# Patient Record
Sex: Male | Born: 1945 | Race: White | Hispanic: No | Marital: Married | Smoking: Former smoker
Health system: Southern US, Community
[De-identification: ages and names within clinical notes are randomized; demographics above are authoritative.]

## PROBLEM LIST (undated history)

## (undated) DIAGNOSIS — N400 Enlarged prostate without lower urinary tract symptoms: Secondary | ICD-10-CM

## (undated) DIAGNOSIS — E119 Type 2 diabetes mellitus without complications: Secondary | ICD-10-CM

## (undated) DIAGNOSIS — K219 Gastro-esophageal reflux disease without esophagitis: Secondary | ICD-10-CM

## (undated) DIAGNOSIS — D500 Iron deficiency anemia secondary to blood loss (chronic): Secondary | ICD-10-CM

## (undated) DIAGNOSIS — I10 Essential (primary) hypertension: Secondary | ICD-10-CM

## (undated) DIAGNOSIS — K429 Umbilical hernia without obstruction or gangrene: Secondary | ICD-10-CM

## (undated) DIAGNOSIS — M1990 Unspecified osteoarthritis, unspecified site: Secondary | ICD-10-CM

## (undated) DIAGNOSIS — E785 Hyperlipidemia, unspecified: Secondary | ICD-10-CM

## (undated) DIAGNOSIS — Z87442 Personal history of urinary calculi: Secondary | ICD-10-CM

## (undated) DIAGNOSIS — R3915 Urgency of urination: Secondary | ICD-10-CM

## (undated) DIAGNOSIS — N481 Balanitis: Secondary | ICD-10-CM

## (undated) DIAGNOSIS — D649 Anemia, unspecified: Secondary | ICD-10-CM

## (undated) DIAGNOSIS — L729 Follicular cyst of the skin and subcutaneous tissue, unspecified: Secondary | ICD-10-CM

## (undated) DIAGNOSIS — E663 Overweight: Secondary | ICD-10-CM

## (undated) DIAGNOSIS — N529 Male erectile dysfunction, unspecified: Secondary | ICD-10-CM

## (undated) DIAGNOSIS — N4830 Priapism, unspecified: Secondary | ICD-10-CM

## (undated) DIAGNOSIS — K469 Unspecified abdominal hernia without obstruction or gangrene: Secondary | ICD-10-CM

## (undated) DIAGNOSIS — N189 Chronic kidney disease, unspecified: Secondary | ICD-10-CM

## (undated) DIAGNOSIS — F419 Anxiety disorder, unspecified: Secondary | ICD-10-CM

## (undated) HISTORY — DX: Priapism, unspecified: N48.30

## (undated) HISTORY — DX: Urgency of urination: R39.15

## (undated) HISTORY — DX: Overweight: E66.3

## (undated) HISTORY — DX: Hyperlipidemia, unspecified: E78.5

## (undated) HISTORY — DX: Iron deficiency anemia secondary to blood loss (chronic): D50.0

## (undated) HISTORY — DX: Anemia, unspecified: D64.9

## (undated) HISTORY — DX: Essential (primary) hypertension: I10

## (undated) HISTORY — DX: Gastro-esophageal reflux disease without esophagitis: K21.9

## (undated) HISTORY — DX: Type 2 diabetes mellitus without complications: E11.9

## (undated) HISTORY — DX: Balanitis: N48.1

## (undated) HISTORY — DX: Male erectile dysfunction, unspecified: N52.9

## (undated) HISTORY — DX: Follicular cyst of the skin and subcutaneous tissue, unspecified: L72.9

## (undated) HISTORY — DX: Benign prostatic hyperplasia without lower urinary tract symptoms: N40.0

---

## 2006-04-23 ENCOUNTER — Ambulatory Visit: Payer: Self-pay

## 2006-04-28 ENCOUNTER — Ambulatory Visit: Payer: Self-pay

## 2007-06-09 ENCOUNTER — Ambulatory Visit: Payer: Self-pay | Admitting: Family Medicine

## 2007-06-09 ENCOUNTER — Other Ambulatory Visit: Payer: Self-pay

## 2009-10-09 ENCOUNTER — Ambulatory Visit: Payer: Self-pay

## 2012-07-16 ENCOUNTER — Ambulatory Visit: Payer: Self-pay

## 2013-02-08 ENCOUNTER — Encounter: Payer: Self-pay | Admitting: Podiatry

## 2013-02-10 ENCOUNTER — Ambulatory Visit (INDEPENDENT_AMBULATORY_CARE_PROVIDER_SITE_OTHER): Payer: PRIVATE HEALTH INSURANCE | Admitting: Podiatry

## 2013-02-10 ENCOUNTER — Encounter: Payer: Self-pay | Admitting: Podiatry

## 2013-02-10 VITALS — BP 144/99 | HR 63 | Resp 16 | Ht 67.0 in | Wt 250.0 lb

## 2013-02-10 DIAGNOSIS — B351 Tinea unguium: Secondary | ICD-10-CM

## 2013-02-10 DIAGNOSIS — M79609 Pain in unspecified limb: Secondary | ICD-10-CM

## 2013-02-10 NOTE — Progress Notes (Signed)
Brandon Benjamin presents today for a chief complaint of painful toenails one through 5 bilateral.  Objective: Pulses remain palpable bilateral. Nails are thick yellow dystrophic clinically mycotic and painful on palpation.  Assessment: Pain in limb secondary to onychomycosis.  Plan: Debridement of nails 1 through 5 bilateral is cover service secondary to pain. Followup with him in 3 months.

## 2013-05-12 ENCOUNTER — Ambulatory Visit: Payer: PRIVATE HEALTH INSURANCE | Admitting: Podiatry

## 2013-06-07 ENCOUNTER — Ambulatory Visit (INDEPENDENT_AMBULATORY_CARE_PROVIDER_SITE_OTHER): Payer: PRIVATE HEALTH INSURANCE | Admitting: Podiatry

## 2013-06-07 ENCOUNTER — Encounter: Payer: Self-pay | Admitting: Podiatry

## 2013-06-07 VITALS — BP 153/99 | HR 68 | Resp 18

## 2013-06-07 DIAGNOSIS — B351 Tinea unguium: Secondary | ICD-10-CM

## 2013-06-07 DIAGNOSIS — M79609 Pain in unspecified limb: Secondary | ICD-10-CM

## 2013-06-07 NOTE — Progress Notes (Signed)
He presents today with a chief complaint of painful elongated toenails, tablet nails cut because they hurt.  Objective: Vital signs are stable he is alert and oriented x3. Pulses are palpable bilateral. Nails are thick yellow dystrophic with mycotic painful palpation.  Assessment: Pain in limb secondary to onychomycosis 1 through 5 bilateral.  Plan: Debridement nails thickness as cover service secondary to pain.

## 2013-09-06 ENCOUNTER — Ambulatory Visit (INDEPENDENT_AMBULATORY_CARE_PROVIDER_SITE_OTHER): Payer: PRIVATE HEALTH INSURANCE | Admitting: Podiatry

## 2013-09-06 VITALS — BP 143/85 | HR 65 | Resp 16

## 2013-09-06 DIAGNOSIS — M79609 Pain in unspecified limb: Secondary | ICD-10-CM

## 2013-09-06 DIAGNOSIS — B351 Tinea unguium: Secondary | ICD-10-CM

## 2013-09-06 NOTE — Progress Notes (Signed)
He presents today with a chief complaint of painful elongated toenails one through 5 bilateral.  Objective: Pulses are palpable bilateral. Nails are thick yellow dystrophic with mycotic and painful palpation.  Assessment: Pain in limb secondary to onychomycosis 1 through 5 bilateral.  Plan: Debridement of nails in thickness and length as cover service secondary to pain.

## 2013-10-18 ENCOUNTER — Ambulatory Visit (INDEPENDENT_AMBULATORY_CARE_PROVIDER_SITE_OTHER): Payer: PRIVATE HEALTH INSURANCE | Admitting: Podiatry

## 2013-10-18 ENCOUNTER — Encounter: Payer: Self-pay | Admitting: Podiatry

## 2013-10-18 VITALS — BP 163/105 | HR 57 | Resp 16

## 2013-10-18 DIAGNOSIS — L600 Ingrowing nail: Secondary | ICD-10-CM

## 2013-10-18 MED ORDER — NEOMYCIN-POLYMYXIN-HC 3.5-10000-1 OT SOLN: OTIC | Status: DC

## 2013-10-18 NOTE — Progress Notes (Signed)
He presents today complaining of a painful ingrown toenail to the fibular border of the hallux left. States has been sore now for the past few weeks and seems to be getting worse.  Objective: Vital signs are stable he is alert and oriented x3. Pulses are strongly palpable bilateral. Fibular border of the hallux left demonstrates a sharp incurvated nail margin with pain on palpation. There is malodor. As.  Assessment: Ingrown nail paronychia abscess fibular border hallux left.  Plan: Chemical matrixectomy was performed today along the fibular border of the hallux left. He tolerated procedure well and I will followup with him in one week.

## 2013-10-18 NOTE — Patient Instructions (Signed)
Betadine Soak Instructions  Purchase an 8 oz. bottle of BETADINE solution (Povidone)  THE DAY AFTER THE PROCEDURE  Place 1 tablespoon of betadine solution in a quart of warm tap water.  Submerge your foot or feet with outer bandage intact for the initial soak; this will allow the bandage to become moist and wet for easy lift off.  Once you remove your bandage, continue to soak in the solution for 20 minutes.  This soak should be done twice a day.  Next, remove your foot or feet from solution, blot dry the affected area and cover.  You Mailhot use a band aid large enough to cover the area or use gauze and tape.  Apply other medications to the area as directed by the doctor such as cortisporin otic solution (ear drops) or neosporin.  IF YOUR SKIN BECOMES IRRITATED WHILE USING THESE INSTRUCTIONS, IT IS OKAY TO SWITCH TO EPSOM SALTS AND WATER OR WHITE VINEGAR AND WATER. 

## 2013-10-21 ENCOUNTER — Telehealth: Payer: Self-pay | Admitting: *Deleted

## 2013-10-21 NOTE — Telephone Encounter (Signed)
Sent refill req for betadine to [facility]. Tried calling pt, # disconnected. Per dr Milinda Pointer refill betadine.

## 2013-10-25 ENCOUNTER — Encounter: Payer: Self-pay | Admitting: Podiatry

## 2013-10-25 ENCOUNTER — Ambulatory Visit (INDEPENDENT_AMBULATORY_CARE_PROVIDER_SITE_OTHER): Payer: PRIVATE HEALTH INSURANCE | Admitting: Podiatry

## 2013-10-25 DIAGNOSIS — L600 Ingrowing nail: Secondary | ICD-10-CM

## 2013-10-25 NOTE — Progress Notes (Signed)
He presents today for followup matrixectomy tibial border of the hallux left. He continues to soak in Betadine and water. He continues Cortisporin otic as directed. He denies fever chills nausea vomiting muscle aches and pains.  Objective: Vital signs stable alert and oriented x3. There is no erythema edema cellulitis drainage or odor.  Assessment: With a surgical toe status post matrixectomy tibial border hallux left.  Plan: Discussed etiology pathology conservative versus surgical therapies at this point we've discontinued Betadine warm water started Epsom salts in warm water twice daily. Continue Cortisporin Otic as directed. Cover during the day and leave open at night. Continue to soak and to completely well.

## 2013-12-06 ENCOUNTER — Encounter: Payer: Self-pay | Admitting: Podiatry

## 2013-12-06 ENCOUNTER — Ambulatory Visit (INDEPENDENT_AMBULATORY_CARE_PROVIDER_SITE_OTHER): Payer: PRIVATE HEALTH INSURANCE | Admitting: Podiatry

## 2013-12-06 VITALS — BP 149/83 | HR 64 | Resp 12

## 2013-12-06 DIAGNOSIS — M79676 Pain in unspecified toe(s): Secondary | ICD-10-CM

## 2013-12-06 DIAGNOSIS — M79609 Pain in unspecified limb: Secondary | ICD-10-CM

## 2013-12-06 DIAGNOSIS — B351 Tinea unguium: Secondary | ICD-10-CM

## 2013-12-06 NOTE — Progress Notes (Signed)
He presents today with a chief complaint of painful elongated toenails. ° °Objective: Nails are thick yellow dystrophic with mycotic and painful palpation. ° °Assessment: Pain in limb secondary to onychomycosis 1 through 5 bilateral. ° °Plan: Debridement of nails thickness and length cover service secondary to pain to °

## 2014-02-28 ENCOUNTER — Ambulatory Visit (INDEPENDENT_AMBULATORY_CARE_PROVIDER_SITE_OTHER): Payer: PRIVATE HEALTH INSURANCE | Admitting: Podiatry

## 2014-02-28 ENCOUNTER — Ambulatory Visit: Payer: PRIVATE HEALTH INSURANCE | Admitting: Podiatry

## 2014-02-28 DIAGNOSIS — B351 Tinea unguium: Secondary | ICD-10-CM

## 2014-02-28 DIAGNOSIS — M79676 Pain in unspecified toe(s): Secondary | ICD-10-CM

## 2014-02-28 NOTE — Progress Notes (Signed)
He presents today with a chief complaint of painful elongated toenails. ° °Objective: Nails are thick yellow dystrophic with mycotic and painful palpation. ° °Assessment: Pain in limb secondary to onychomycosis 1 through 5 bilateral. ° °Plan: Debridement of nails thickness and length cover service secondary to pain to °

## 2014-05-05 ENCOUNTER — Ambulatory Visit: Payer: Self-pay | Admitting: Urology

## 2014-05-30 ENCOUNTER — Ambulatory Visit (INDEPENDENT_AMBULATORY_CARE_PROVIDER_SITE_OTHER): Payer: Medicare Other | Admitting: Podiatry

## 2014-05-30 DIAGNOSIS — M79676 Pain in unspecified toe(s): Secondary | ICD-10-CM

## 2014-05-30 DIAGNOSIS — B351 Tinea unguium: Secondary | ICD-10-CM | POA: Diagnosis not present

## 2014-05-30 NOTE — Progress Notes (Signed)
He presents today with a chief complaint of painful elongated toenails.  Objective: Nails are thick yellow dystrophic with mycotic and painful palpation.  Assessment: Pain in limb secondary to onychomycosis 1 through 5 bilateral.  Plan: Debridement of nails thickness and length cover service secondary to pain to

## 2014-09-05 ENCOUNTER — Ambulatory Visit (INDEPENDENT_AMBULATORY_CARE_PROVIDER_SITE_OTHER): Payer: Medicare Other | Admitting: Podiatry

## 2014-09-05 DIAGNOSIS — M79676 Pain in unspecified toe(s): Secondary | ICD-10-CM | POA: Diagnosis not present

## 2014-09-05 DIAGNOSIS — B351 Tinea unguium: Secondary | ICD-10-CM

## 2014-09-05 NOTE — Progress Notes (Signed)
He presents today with a chief complaint of painful elongated toenails.  Objective: Nails are thick yellow dystrophic with mycotic and painful palpation.  Assessment: Pain in limb secondary to onychomycosis 1 through 5 bilateral.  Plan: Debridement of nails thickness and length cover service secondary to pain.           Slight maceration R 4-5 toe web.

## 2014-09-12 ENCOUNTER — Ambulatory Visit: Payer: Medicaid Other

## 2014-11-08 ENCOUNTER — Other Ambulatory Visit: Payer: Self-pay | Admitting: *Deleted

## 2014-11-08 ENCOUNTER — Encounter: Payer: Self-pay | Admitting: *Deleted

## 2014-11-14 ENCOUNTER — Other Ambulatory Visit: Payer: Medicare Other

## 2014-11-14 DIAGNOSIS — R972 Elevated prostate specific antigen [PSA]: Secondary | ICD-10-CM

## 2014-11-15 LAB — PSA: Prostate Specific Ag, Serum: 2.8 ng/mL (ref 0.0–4.0)

## 2014-11-16 ENCOUNTER — Encounter: Payer: Self-pay | Admitting: Urology

## 2014-11-16 ENCOUNTER — Ambulatory Visit (INDEPENDENT_AMBULATORY_CARE_PROVIDER_SITE_OTHER): Payer: Medicare Other | Admitting: Urology

## 2014-11-16 VITALS — BP 142/85 | HR 62 | Resp 18 | Ht 69.0 in | Wt 224.0 lb

## 2014-11-16 DIAGNOSIS — N138 Other obstructive and reflux uropathy: Secondary | ICD-10-CM | POA: Insufficient documentation

## 2014-11-16 DIAGNOSIS — N401 Enlarged prostate with lower urinary tract symptoms: Secondary | ICD-10-CM | POA: Diagnosis not present

## 2014-11-16 LAB — BLADDER SCAN AMB NON-IMAGING

## 2014-11-16 MED ORDER — TAMSULOSIN HCL 0.4 MG PO CAPS: 0.4000 mg | ORAL_CAPSULE | Freq: Every day | ORAL | Status: DC

## 2014-11-16 NOTE — Progress Notes (Signed)
11/16/2014 2:21 PM   Brandon Benjamin [birth year] [id number]  Referring provider: Letta Median, MD [place], [place] [address]  Chief Complaint  Patient presents with  . Follow-up    6 month for dysuria.  . Dysuria  . Benign Prostatic Hypertrophy    PSA on 11/14/14 = 2.8  . BALANITIS    HPI: Brandon Benjamin is a 69 year old white male with BPH and LUTS who presents today for 6 month follow-up.  He also has a history of a spermatocele and balanitis.  BPH WITH LUTS His IPSS score today is 16, which is moderate lower urinary tract symptomatology. He is unhappy with his quality life due to his urinary symptoms. His PVR is 165 mL.  His previous IPSS score was 11/5.  His previous PVR is 65 mL.    His major complaint today urinary frequency and urgency upon awakening in the morning.  He states that this resolves as the day goes on..  He has had these symptoms for two years.  He denies any dysuria, hematuria or suprapubic pain.   He currently taking tamsulosin and finasteride.     He also denies any recent fevers, chills, nausea or vomiting.  He does not have a family history of PCa.      IPSS      11/16/14 1400       International Prostate Symptom Score   How often have you had the sensation of not emptying your bladder? Less than half the time     How often have you had to urinate less than every two hours? More than half the time     How often have you found you stopped and started again several times when you urinated? Less than 1 in 5 times     How often have you found it difficult to postpone urination? Almost always     How often have you had a weak urinary stream? Less than 1 in 5 times     How often have you had to strain to start urination? Less than 1 in 5 times     How many times did you typically get up at night to urinate? 2 Times     Total IPSS Score 16     Quality of Life due to urinary symptoms   If you were to spend the rest of your life  with your urinary condition just the way it is now how would you feel about that? Unhappy        Score:  1-7 Mild 8-19 Moderate 20-35 Severe   He has not had any scrotal pain with his spermatocele and his balanitis has resolved.   PMH: Past Medical History  Diagnosis Date  . Anemia   . Priapism   . HLD (hyperlipidemia)   . GERD (gastroesophageal reflux disease)   . Erectile dysfunction   . Balanitis   . BPH (benign prostatic hyperplasia)   . Over weight   . Scrotal cyst   . HTN (hypertension)   . Urinary urgency     Surgical History: No past surgical history on file.  Home Medications:    Medication List       This list is accurate as of: 11/16/14  2:21 PM.  Always use your most recent med list.               amLODipine 10 MG tablet  Commonly known as:  NORVASC     amLODipine 5 MG  tablet  Commonly known as:  NORVASC  Take 5 mg by mouth daily.     doxazosin 4 MG tablet  Commonly known as:  CARDURA  Take 4 mg by mouth daily.     ferrous sulfate 325 (65 FE) MG tablet  Take 325 mg by mouth daily with breakfast.     finasteride 5 MG tablet  Commonly known as:  PROSCAR     fluticasone 50 MCG/ACT nasal spray  Commonly known as:  FLONASE     hydrochlorothiazide 25 MG tablet  Commonly known as:  HYDRODIURIL  Take 25 mg by mouth daily.     lisinopril 40 MG tablet  Commonly known as:  PRINIVIL,ZESTRIL  Take 40 mg by mouth daily.     lovastatin 20 MG tablet  Commonly known as:  MEVACOR  Take 20 mg by mouth at bedtime.     metoprolol 100 MG tablet  Commonly known as:  LOPRESSOR     neomycin-polymyxin-hydrocortisone otic solution  Commonly known as:  CORTISPORIN  1-2 drops to the toe after soaking twice daily     nystatin cream  Commonly known as:  MYCOSTATIN     nystatin-triamcinolone cream  Commonly known as:  MYCOLOG II     ranitidine 150 MG tablet  Commonly known as:  ZANTAC  Take 150 mg by mouth 2 (two) times daily.     tamsulosin 0.4  MG Caps capsule  Commonly known as:  FLOMAX        Allergies: No Known Allergies  Family History: Family History  Problem Relation Age of Onset  . Cancer Mother   . High blood pressure Mother   . Prostate cancer Neg Hx   . Bladder Cancer Neg Hx     Social History:  reports that he has quit smoking. His smoking use included Cigarettes. He has never used smokeless tobacco. He reports that he does not drink alcohol or use illicit drugs.  ROS: UROLOGY Frequent Urination?: No Hard to postpone urination?: Yes Burning/pain with urination?: Yes Get up at night to urinate?: Yes Leakage of urine?: No Urine stream starts and stops?: Yes Trouble starting stream?: Yes Do you have to strain to urinate?: No Blood in urine?: No Urinary tract infection?: No Sexually transmitted disease?: No Injury to kidneys or bladder?: No Painful intercourse?: No Weak stream?: Yes Erection problems?: No Penile pain?: Yes  Gastrointestinal Nausea?: Yes Vomiting?: No Indigestion/heartburn?: No Diarrhea?: No Constipation?: No  Constitutional Fever: No Night sweats?: No Weight loss?: No Fatigue?: No  Skin Skin rash/lesions?: No Itching?: No  Eyes Blurred vision?: No Double vision?: No  Ears/Nose/Throat Sore throat?: No Sinus problems?: No  Hematologic/Lymphatic Swollen glands?: No Easy bruising?: No  Cardiovascular Leg swelling?: No Chest pain?: No  Respiratory Cough?: No Shortness of breath?: No  Endocrine Excessive thirst?: No  Musculoskeletal Back pain?: No Joint pain?: No  Neurological Headaches?: No Dizziness?: No  Psychologic Depression?: No Anxiety?: No  Physical Exam: BP 142/85 mmHg  Pulse 62  Resp 18  Ht 5\' 9"  (1.753 m)  Wt 224 lb (101.606 kg)  BMI 33.06 kg/m2  GU: Patient with uncircumcised phallus. Foreskin easily retracted  Frenulum breve.   Urethral meatus is patent.  No penile discharge. No penile lesions or rashes. Scrotum without lesions,  cysts, rashes and/or edema.  Testicles are located scrotally bilaterally. No masses are appreciated in the testicles. Left and right epididymis are normal.  Large left spermatocele as noted Rectal: Patient with  normal sphincter tone. Perineum without  scarring or rashes. No rectal masses are appreciated. Prostate is approximately 60 grams, no nodules are appreciated. Seminal vesicles are normal.   Laboratory Data:  Lab Results  Component Value Date   PSA 2.8 11/14/2014   Previous PSA was 2.8 ng/mL on 05/17/2014  Pertinent Imaging: Results for orders placed or performed in visit on 11/16/14  BLADDER SCAN AMB NON-IMAGING  Result Value Ref Range   Scan Result 180ml     Assessment & Plan:    1. BPH (benign prostatic hyperplasia) with LUTS:    Patient's IPSS score is 16/5.  His PVR 165 mL.  His DRE demonstrates large amount, no nodules.  Patient is on maximal medical therapy with tamsulosin and finasteride, but he still experiencing morning time urgency and frequency with moderate residuals.  I discussed with him possible all it procedure, but he is not interested in pursuing a surgical treatment at this time.  He will continue his medications and return in 6 months for a PSA, DRE, PVR and an IPSS.    - BLADDER SCAN AMB NON-IMAGING   No Follow-up on file.  Zara Council, [facility] [address] [place], [place] [address] [phone]

## 2014-12-06 ENCOUNTER — Ambulatory Visit (INDEPENDENT_AMBULATORY_CARE_PROVIDER_SITE_OTHER): Payer: Medicare Other | Admitting: Podiatry

## 2014-12-06 DIAGNOSIS — M79676 Pain in unspecified toe(s): Secondary | ICD-10-CM

## 2014-12-06 DIAGNOSIS — B351 Tinea unguium: Secondary | ICD-10-CM

## 2014-12-06 NOTE — Progress Notes (Signed)
Subjective: 69 y.o. returns the office today for painful, elongated, thickened toenails which he is unable to trim himself. Denies any redness or drainage around the nails. Denies any acute changes since last appointment and no new complaints today. Denies any systemic complaints such as fevers, chills, nausea, vomiting.   Objective: AAO 3, NAD DP/PT pulses palpable, CRT less than 3 seconds Nails hypertrophic, dystrophic, elongated, brittle, discolored 10. There is tenderness overlying the nails 1-5 bilaterally. There is no surrounding erythema or drainage along the nail sites. No open lesions or pre-ulcerative lesions are identified. No other areas of tenderness bilateral lower extremities. No overlying edema, erythema, increased warmth. No pain with calf compression, swelling, warmth, erythema.  Assessment: Patient presents with symptomatic onychomycosis  Plan: -Treatment options including alternatives, risks, complications were discussed -Nails sharply debrided 10 without complication/bleeding. -Discussed daily foot inspection. If there are any changes, to call the office immediately.  -Follow-up in 3 months or sooner if any problems are to arise. In the meantime, encouraged to call the office with any questions, concerns, changes symptoms.  Celesta Gentile, DPM

## 2015-03-07 ENCOUNTER — Ambulatory Visit (INDEPENDENT_AMBULATORY_CARE_PROVIDER_SITE_OTHER): Payer: Medicare Other | Admitting: Podiatry

## 2015-03-07 ENCOUNTER — Encounter: Payer: Self-pay | Admitting: Podiatry

## 2015-03-07 DIAGNOSIS — M79676 Pain in unspecified toe(s): Secondary | ICD-10-CM

## 2015-03-07 DIAGNOSIS — B351 Tinea unguium: Secondary | ICD-10-CM | POA: Diagnosis not present

## 2015-03-08 NOTE — Progress Notes (Signed)
Subjective: 69 y.o. returns the office today for painful, elongated, thickened toenails which he is unable to trim himself. Denies any redness or drainage around the nails. Denies any acute changes since last appointment and no new complaints today. Denies any systemic complaints such as fevers, chills, nausea, vomiting.   Objective: AAO 3, NAD DP/PT pulses palpable, CRT less than 3 seconds Nails hypertrophic, dystrophic, elongated, brittle, discolored 10. There is tenderness overlying the nails 1-5 bilaterally. There is no surrounding erythema or drainage along the nail sites. No open lesions or pre-ulcerative lesions are identified. No other areas of tenderness bilateral lower extremities. No overlying edema, erythema, increased warmth. No pain with calf compression, swelling, warmth, erythema.  Assessment: Patient presents with symptomatic onychomycosis  Plan: -Treatment options including alternatives, risks, complications were discussed -Nails sharply debrided 10 without complication/bleeding. -Discussed daily foot inspection. If there are any changes, to call the office immediately.  -Follow-up in 3 months or sooner if any problems are to arise. In the meantime, encouraged to call the office with any questions, concerns, changes symptoms.  Joyelle Siedlecki, DPM  

## 2015-05-12 ENCOUNTER — Other Ambulatory Visit: Payer: Self-pay

## 2015-05-12 DIAGNOSIS — R972 Elevated prostate specific antigen [PSA]: Secondary | ICD-10-CM

## 2015-05-15 ENCOUNTER — Other Ambulatory Visit: Payer: Medicare Other

## 2015-05-15 DIAGNOSIS — R972 Elevated prostate specific antigen [PSA]: Secondary | ICD-10-CM

## 2015-05-16 LAB — PSA: PROSTATE SPECIFIC AG, SERUM: 2.8 ng/mL (ref 0.0–4.0)

## 2015-05-18 ENCOUNTER — Ambulatory Visit (INDEPENDENT_AMBULATORY_CARE_PROVIDER_SITE_OTHER): Payer: Medicare Other | Admitting: Urology

## 2015-05-18 ENCOUNTER — Encounter: Payer: Self-pay | Admitting: Urology

## 2015-05-18 VITALS — BP 196/101 | HR 75 | Ht 69.0 in | Wt 215.3 lb

## 2015-05-18 DIAGNOSIS — N401 Enlarged prostate with lower urinary tract symptoms: Secondary | ICD-10-CM | POA: Diagnosis not present

## 2015-05-18 DIAGNOSIS — N4340 Spermatocele of epididymis, unspecified: Secondary | ICD-10-CM | POA: Diagnosis not present

## 2015-05-18 DIAGNOSIS — N138 Other obstructive and reflux uropathy: Secondary | ICD-10-CM

## 2015-05-18 LAB — BLADDER SCAN AMB NON-IMAGING

## 2015-05-18 NOTE — Progress Notes (Signed)
2:10 PM   Brandon Benjamin 11/17/1945 [id number]  Referring provider: Letta Median, MD [place], [place] [address]  Chief Complaint  Patient presents with  . Benign Prostatic Hypertrophy    45month    HPI: Brandon Benjamin is a 70 year old white male with BPH and LUTS who presents today for 6 month follow-up.  He also has a history of a spermatocele and balanitis.  BPH WITH LUTS His IPSS score today is 16, which is moderate lower urinary tract symptomatology. He is mostly with his quality life due to his urinary symptoms. His PVR is 79 mL.  His previous IPSS score was 16/5.  His previous PVR is 165 mL.  His major complaint today is the worsening of urinary frequency and urgency.  He is now experiencing sprain of the urinary stream.  He has had these symptoms for three years.  He denies any dysuria, hematuria or suprapubic pain.  He currently taking tamsulosin and finasteride.  He also denies any recent fevers, chills, nausea or vomiting.  He does not have a family history of PCa.      IPSS      05/18/15 1300       International Prostate Symptom Score   How often have you had the sensation of not emptying your bladder? More than half the time     How often have you had to urinate less than every two hours? Less than half the time     How often have you found you stopped and started again several times when you urinated? Less than half the time     How often have you found it difficult to postpone urination? Almost always     How often have you had a weak urinary stream? Almost always     How often have you had to strain to start urination? Almost always     How many times did you typically get up at night to urinate? 4 Times     Total IPSS Score 27     Quality of Life due to urinary symptoms   If you were to spend the rest of your life with your urinary condition just the way it is now how would you feel about that? Mostly Disatisfied        Score:  1-7  Mild 8-19 Moderate 20-35 Severe  Spermatocele Scrotal ultrasound performed on 05/05/2014 noted a large septated cyst superiorly in the left scrotum, possibly arising from the epididymis. No typical hydrocele. He has not noted any changes on testicular self exams.  Balanitis Patient is balanitis has been successfully treated with Mycolog cream.    PMH: Past Medical History  Diagnosis Date  . Anemia   . Priapism   . HLD (hyperlipidemia)   . GERD (gastroesophageal reflux disease)   . Erectile dysfunction   . Balanitis   . BPH (benign prostatic hyperplasia)   . Over weight   . Scrotal cyst   . HTN (hypertension)   . Urinary urgency   . Diabetes [facility])     Surgical History: No past surgical history on file.  Home Medications:    Medication List       This list is accurate as of: 05/18/15  2:10 PM.  Always use your most recent med list.               amLODipine 5 MG tablet  Commonly known as:  NORVASC  Take 5 mg by mouth daily.  doxazosin 4 MG tablet  Commonly known as:  CARDURA  Take 4 mg by mouth daily.     finasteride 5 MG tablet  Commonly known as:  PROSCAR     fluticasone 50 MCG/ACT nasal spray  Commonly known as:  FLONASE     hydrochlorothiazide 25 MG tablet  Commonly known as:  HYDRODIURIL  Take 25 mg by mouth daily.     lisinopril 40 MG tablet  Commonly known as:  PRINIVIL,ZESTRIL  Take 40 mg by mouth daily.     lovastatin 20 MG tablet  Commonly known as:  MEVACOR  Take 20 mg by mouth at bedtime.     metFORMIN 500 MG tablet  Commonly known as:  GLUCOPHAGE     metoprolol 100 MG tablet  Commonly known as:  LOPRESSOR     nystatin cream  Commonly known as:  MYCOSTATIN     nystatin-triamcinolone cream  Commonly known as:  MYCOLOG II     ranitidine 150 MG tablet  Commonly known as:  ZANTAC  Take 150 mg by mouth 2 (two) times daily.     tamsulosin 0.4 MG Caps capsule  Commonly known as:  FLOMAX  Take 1 capsule (0.4 mg total) by mouth  daily.        Allergies: No Known Allergies  Family History: Family History  Problem Relation Age of Onset  . Cancer Mother   . High blood pressure Mother   . Prostate cancer Neg Hx   . Bladder Cancer Neg Hx     Social History:  reports that he has quit smoking. His smoking use included Cigarettes. He has never used smokeless tobacco. He reports that he does not drink alcohol or use illicit drugs.  ROS: UROLOGY Frequent Urination?: No Hard to postpone urination?: Yes Burning/pain with urination?: No Get up at night to urinate?: Yes Leakage of urine?: Yes Urine stream starts and stops?: No Trouble starting stream?: Yes Do you have to strain to urinate?: No Blood in urine?: No Urinary tract infection?: No Sexually transmitted disease?: No Injury to kidneys or bladder?: No Painful intercourse?: No Weak stream?: Yes Erection problems?: No Penile pain?: No  Gastrointestinal Nausea?: No Vomiting?: No Indigestion/heartburn?: Yes Diarrhea?: No Constipation?: No  Constitutional Fever: No Night sweats?: No Weight loss?: No Fatigue?: No  Skin Skin rash/lesions?: No Itching?: No  Eyes Blurred vision?: No Double vision?: No  Ears/Nose/Throat Sore throat?: No Sinus problems?: No  Hematologic/Lymphatic Swollen glands?: No Easy bruising?: No  Cardiovascular Leg swelling?: No Chest pain?: No  Respiratory Cough?: No Shortness of breath?: No  Endocrine Excessive thirst?: No  Musculoskeletal Back pain?: No Joint pain?: No  Neurological Headaches?: No Dizziness?: No  Psychologic Depression?: No Anxiety?: No  Physical Exam: BP 196/101 mmHg  Pulse 75  Ht 5\' 9"  (1.753 m)  Wt 215 lb 4.8 oz (97.659 kg)  BMI 31.78 kg/m2  GU: Patient with uncircumcised phallus. Foreskin easily retracted  Frenulum breve.   Urethral meatus is patent.  No penile discharge. No penile lesions or rashes. Scrotum without lesions, cysts, rashes and/or edema.  Testicles are  located scrotally bilaterally. No masses are appreciated in the testicles. Left and right epididymis are normal.  Large left spermatocele as noted Rectal: Patient with  normal sphincter tone. Perineum without scarring or rashes. No rectal masses are appreciated. Prostate is approximately 60 grams, no nodules are appreciated. Seminal vesicles are normal.   Laboratory Data: PSA History  2.8 ng/mL on 05/17/2014  2.8 ng/mL on 11/14/2014  Pertinent Imaging: Results for orders placed or performed in visit on 05/18/15  BLADDER SCAN AMB NON-IMAGING  Result Value Ref Range   Scan Result 81ml     Assessment & Plan:    1. BPH (benign prostatic hyperplasia) with LUTS:    Patient's IPSS score is 27/4.  His PVR 79 mL.  His DRE demonstrates large amount, no nodules.  Patient is on maximal medical therapy with tamsulosin and finasteride, but his obstructive urinary symptoms are worsening.  He is now experiencing spraying of the urinary stream.   Patient is now interested in pursuing a possible surgical procedure.  I have explained to the patient that they will  be scheduled for a cystoscopy in our office to evaluate the appropriateness of a possible bladder outlet procedure.  I stated that the cystoscopy consists of passing a tube with a lens up through their urethra and into their urinary bladder.   We will inject the urethra with a lidocaine gel prior to introducing the cystoscope to help with any discomfort during the procedure.   After the procedure, they might experience blood in the urine and discomfort with urination.  This will abate after the first few voids.  I have  encouraged the patient to increase water intake  during this time.  Patient denies any allergies to lidocaine.  He will continue the tamsulosin and finasteride at this time.  - BLADDER SCAN AMB NON-IMAGING  2. Spermatocele:   Patient will notify us of any changes found on testicular exams.    3. Balanitis:  Resolved.     Return for  cystoscopy with Dr. Erlene Quan for BOO.  Zara Council, [facility] [address], [place], [place] [address] [phone]

## 2015-05-20 ENCOUNTER — Encounter: Payer: Self-pay | Admitting: Urology

## 2015-05-20 DIAGNOSIS — N4340 Spermatocele of epididymis, unspecified: Secondary | ICD-10-CM | POA: Insufficient documentation

## 2015-05-29 ENCOUNTER — Telehealth: Payer: Self-pay | Admitting: Urology

## 2015-05-29 DIAGNOSIS — N400 Enlarged prostate without lower urinary tract symptoms: Secondary | ICD-10-CM

## 2015-05-29 MED ORDER — FINASTERIDE 5 MG PO TABS: 5.0000 mg | ORAL_TABLET | Freq: Every day | ORAL | Status: DC

## 2015-05-29 NOTE — Telephone Encounter (Signed)
Patient's wife called this morning regarding a refill for Finasteride 5mg  to [place].

## 2015-05-29 NOTE — Telephone Encounter (Signed)
Medication sent to pharmacy  

## 2015-06-06 ENCOUNTER — Ambulatory Visit (INDEPENDENT_AMBULATORY_CARE_PROVIDER_SITE_OTHER): Payer: Medicare Other | Admitting: Urology

## 2015-06-06 ENCOUNTER — Ambulatory Visit: Payer: Medicare Other | Admitting: Podiatry

## 2015-06-06 VITALS — BP 177/96 | HR 71 | Ht 69.0 in | Wt 217.0 lb

## 2015-06-06 DIAGNOSIS — N401 Enlarged prostate with lower urinary tract symptoms: Secondary | ICD-10-CM | POA: Diagnosis not present

## 2015-06-06 DIAGNOSIS — N138 Other obstructive and reflux uropathy: Secondary | ICD-10-CM

## 2015-06-06 LAB — MICROSCOPIC EXAMINATION
Bacteria, UA: NONE SEEN
Epithelial Cells (non renal): NONE SEEN /hpf (ref 0–10)
WBC, UA: NONE SEEN /hpf (ref 0–?)

## 2015-06-06 LAB — URINALYSIS, COMPLETE
Bilirubin, UA: NEGATIVE
GLUCOSE, UA: NEGATIVE
KETONES UA: NEGATIVE
LEUKOCYTES UA: NEGATIVE
Nitrite, UA: NEGATIVE
Protein, UA: NEGATIVE
Specific Gravity, UA: 1.01 (ref 1.005–1.030)
Urobilinogen, Ur: 0.2 mg/dL (ref 0.2–1.0)
pH, UA: 7 (ref 5.0–7.5)

## 2015-06-06 MED ORDER — CIPROFLOXACIN HCL 500 MG PO TABS
500.0000 mg | ORAL_TABLET | Freq: Once | ORAL | Status: AC
  Administered 2015-06-06: 500 mg via ORAL

## 2015-06-06 MED ORDER — LIDOCAINE HCL 2 % EX GEL
1.0000 "application " | Freq: Once | CUTANEOUS | Status: AC
  Administered 2015-06-06: 1 via URETHRAL

## 2015-06-06 NOTE — Progress Notes (Signed)
4:07 PM  06/06/2015   Brandon Benjamin 01-Apr-1945 [id number]  Referring provider: Letta Median, MD Brandon Benjamin, [place] Benjamin [id number]  Chief Complaint  Patient presents with  . Cysto    HPI: Brandon Benjamin is a 70 year old white male with BPH and LUTS presents today for cystoscopy for further work up of LUTS .  He also has a history of a spermatocele and balanitis.  BPH WITH LUTS Currently on finasteride and flomax with significant refractory symptoms  he has mixed obstructive and irritative voiding symptoms including weak stream, history of incomplete bladder emptying with elevated PVRs, stopping starting of his urinary stream along with urinary frequency , urgency, and rare urge incontinence. He also has nocturia 3-4. He is very unhappy with his overall voiding symptoms. Rectal exam on 05/18/15 with enlarged 60 cc gland, no nodules. PSA 2.8 on 8/26 No UTIs or bladder stones      IPSS      05/18/15 1300       International Prostate Symptom Score   How often have you had the sensation of not emptying your bladder? More than half the time     How often have you had to urinate less than every two hours? Less than half the time     How often have you found you stopped and started again several times when you urinated? Less than half the time     How often have you found it difficult to postpone urination? Almost always     How often have you had a weak urinary stream? Almost always     How often have you had to strain to start urination? Almost always     How many times did you typically get up at night to urinate? 4 Times     Total IPSS Score 27     Quality of Life due to urinary symptoms   If you were to spend the rest of your life with your urinary condition just the way it is now how would you feel about that? Mostly Disatisfied        Score:  1-7 Mild 8-19 Moderate 20-35 Severe  Spermatocele Scrotal ultrasound performed on 05/05/2014 noted a large  septated cyst superiorly in the left scrotum, possibly arising from the epididymis. No typical hydrocele. He has not noted any changes on testicular self exams.  Balanitis Patient is balanitis has been successfully treated with Mycolog cream.    PMH: Past Medical History  Diagnosis Date  . Anemia   . Priapism   . HLD (hyperlipidemia)   . GERD (gastroesophageal reflux disease)   . Erectile dysfunction   . Balanitis   . BPH (benign prostatic hyperplasia)   . Over weight   . Scrotal cyst   . HTN (hypertension)   . Urinary urgency   . Diabetes [name])     Surgical History: No past surgical history on file.  Home Medications:    Medication List       This list is accurate as of: 06/06/15  4:07 PM.  Always use your most recent med list.               amLODipine 5 MG tablet  Commonly known as:  NORVASC  Take 5 mg by mouth daily.     doxazosin 4 MG tablet  Commonly known as:  CARDURA  Take 4 mg by mouth daily.     finasteride 5 MG tablet  Commonly known as:  PROSCAR  Take 1 tablet (5 mg total) by mouth daily.     fluticasone 50 MCG/ACT nasal spray  Commonly known as:  FLONASE     hydrochlorothiazide 25 MG tablet  Commonly known as:  HYDRODIURIL  Take 25 mg by mouth daily.     lisinopril 40 MG tablet  Commonly known as:  PRINIVIL,ZESTRIL  Take 40 mg by mouth daily.     lovastatin 20 MG tablet  Commonly known as:  MEVACOR  Take 20 mg by mouth at bedtime.     metFORMIN 500 MG tablet  Commonly known as:  GLUCOPHAGE     metoprolol 100 MG tablet  Commonly known as:  LOPRESSOR     nystatin cream  Commonly known as:  MYCOSTATIN     nystatin-triamcinolone cream  Commonly known as:  MYCOLOG II     ranitidine 150 MG tablet  Commonly known as:  ZANTAC  Take 150 mg by mouth 2 (two) times daily.     tamsulosin 0.4 MG Caps capsule  Commonly known as:  FLOMAX  Take 1 capsule (0.4 mg total) by mouth daily.        Allergies: No Known Allergies  Family  History: Family History  Problem Relation Age of Onset  . Cancer Mother   . High blood pressure Mother   . Prostate cancer Neg Hx   . Bladder Cancer Neg Hx     Social History:  reports that he has quit smoking. His smoking use included Cigarettes. He has never used smokeless tobacco. He reports that he does not drink alcohol or use illicit drugs.      Physical Exam: BP 177/96 mmHg  Pulse 71  Ht 5\' 9"  (1.753 m)  Wt 217 lb (98.431 kg)  BMI 32.03 kg/m2  Physical Exam  Constitutional: He is oriented to person, place, and time. He appears well-developed and well-nourished.  Presents with wife today  HENT:  Head: Normocephalic and atraumatic.  Neck: Normal range of motion. Neck supple.  Pulmonary/Chest: Effort normal. No respiratory distress.  Abdominal: Soft. Bowel sounds are normal.  Genitourinary: Penis normal.  Neurological: He is alert and oriented to person, place, and time.  Skin: Skin is warm and dry.  Psychiatric: He has a normal mood and affect.     Laboratory Data: PSA History  2.8 ng/mL on 05/17/2014  2.8 ng/mL on 11/14/2014  Pertinent Imaging: Results for orders placed or performed in visit on 05/18/15  BLADDER SCAN AMB NON-IMAGING  Result Value Ref Range   Scan Result 58ml    Urinalysis: Few WBCs, otherwise negative    Cystoscopy Procedure Note  Patient identification was confirmed, informed consent was obtained, and patient was prepped using Betadine solution.  Lidocaine jelly was administered per urethral meatus.    Preoperative abx where received prior to procedure.     Pre-Procedure: - Inspection reveals a normal caliber ureteral meatus.  Procedure: The flexible cystoscope was introduced without difficulty - No urethral strictures/lesions are present. - Enlarged prostate trilobar coaptation with bullous edema on the prostatic mucosa, 6 cm length - Elevated bladder neck - Bilateral ureteral orifices identified (but somewhat difficult to see  initially due to elevated bladder neck) - Bladder mucosa  reveals no ulcers, tumors, or lesions - No bladder stones - Moderate trabeculation  Retroflexion shows subtle intravesical protrusion of prostate   Post-Procedure: - Patient tolerated the procedure well   Assessment & Plan:    1. BPH (benign prostatic hyperplasia) with LUTS:    At  this point, patient has failed maximal medical management of BPH/ LUTS/ BOO with refractory irritative and obstructive voiding symptoms. Prostatic fossa does have a significantly obstructive appearance today. I do think the patient would benefit from a bladder outlet procedure which I briefly discussed with he and his wife today.  I have recommended obtaining a truss volume to accurately size his prostate prior to making any specific recommendations on which procedure he Lucus benefit from the most. If prostate is 150 cc or less, he Stockley be an excellent candidate for holmium laser enucleation of the prostate. We briefly discussed transurethral surgery today along with bladder rehabilitation.  He'll return in 2 weeks for passive sizing at which time we will continue his conversation.  2. Spermatocele:   Patient will notify us of any changes found on testicular exams.    3. Balanitis:  Resolved.     Return in about 2 weeks (around 06/20/2015) for TRUS prostate sizing.  Hollice Espy, MD  [facility] [address], [place], [place] [address] [phone]

## 2015-06-07 ENCOUNTER — Ambulatory Visit (INDEPENDENT_AMBULATORY_CARE_PROVIDER_SITE_OTHER): Payer: Medicare Other | Admitting: Podiatry

## 2015-06-07 ENCOUNTER — Encounter: Payer: Self-pay | Admitting: Podiatry

## 2015-06-07 DIAGNOSIS — M79676 Pain in unspecified toe(s): Secondary | ICD-10-CM

## 2015-06-07 DIAGNOSIS — B351 Tinea unguium: Secondary | ICD-10-CM

## 2015-06-07 NOTE — Progress Notes (Signed)
He presents today with a chief complaint of painful elongated toenails bilaterally. States they run shoes.  Objective: Vital signs are stable he is alert and oriented 3 pulses are palpable. No calf pain. No open lesions or wounds. Toenails are thick yellow dystrophic, mycotic and painful to palpation 1 through 5 bilateral foot.  Assessment: Pain in limb secondary to onychomycosis.  Plan: Debridement of toenails 1 through 5 bilateral.

## 2015-06-27 ENCOUNTER — Encounter: Payer: Self-pay | Admitting: Urology

## 2015-06-27 ENCOUNTER — Ambulatory Visit (INDEPENDENT_AMBULATORY_CARE_PROVIDER_SITE_OTHER): Payer: Medicare Other | Admitting: Urology

## 2015-06-27 VITALS — BP 175/101 | HR 76 | Ht 68.0 in | Wt 221.0 lb

## 2015-06-27 DIAGNOSIS — N400 Enlarged prostate without lower urinary tract symptoms: Secondary | ICD-10-CM

## 2015-06-27 DIAGNOSIS — N401 Enlarged prostate with lower urinary tract symptoms: Secondary | ICD-10-CM | POA: Diagnosis not present

## 2015-06-27 DIAGNOSIS — N4340 Spermatocele of epididymis, unspecified: Secondary | ICD-10-CM | POA: Diagnosis not present

## 2015-06-27 DIAGNOSIS — N138 Other obstructive and reflux uropathy: Secondary | ICD-10-CM

## 2015-06-27 NOTE — Progress Notes (Signed)
5:06 PM  06/27/2015   Brandon Benjamin Jul 03, 1945 [id number]  Referring provider: Letta Median, MD [place], [place] [id number]  Chief Complaint  Patient presents with  . TURS    HPI: Brandon Benjamin is a 70 year old white male with BPH and LUTS presents today for TRUS volume of prostate for surgical planning for management of his refractory BPH with BOO.  He also has a history of a spermatocele and balanitis.  BPH WITH LUTS Currently on finasteride and flomax with significant refractory symptoms  Je has mixed obstructive and irritative voiding symptoms including weak stream, history of incomplete bladder emptying with elevated PVRs, stopping starting of his urinary stream along with urinary frequency , urgency, and rare urge incontinence. He also has nocturia 3-4. He is very unhappy with his overall voiding symptoms. Rectal exam on 05/18/15 with enlarged 60 cc gland, no nodules. PSA 2.8 on 8/26 No UTIs or bladder stones TRUS volume today 112 cc gland Cystoscopy with trilobar coaptation, 6 cm or prosthetic length, elevated bladder neck, and subtle intravesical protrusion of an enlarged prostate gland      IPSS      05/18/15 1300       International Prostate Symptom Score   How often have you had the sensation of not emptying your bladder? More than half the time     How often have you had to urinate less than every two hours? Less than half the time     How often have you found you stopped and started again several times when you urinated? Less than half the time     How often have you found it difficult to postpone urination? Almost always     How often have you had a weak urinary stream? Almost always     How often have you had to strain to start urination? Almost always     How many times did you typically get up at night to urinate? 4 Times     Total IPSS Score 27     Quality of Life due to urinary symptoms   If you were to spend the rest of your  life with your urinary condition just the way it is now how would you feel about that? Mostly Disatisfied        Score:  1-7 Mild 8-19 Moderate 20-35 Severe  Spermatocele Scrotal ultrasound performed on 05/05/2014 noted a large septated cyst superiorly in the left scrotum, possibly arising from the epididymis. No typical hydrocele. He has not noted any changes on testicular self exams.  Balanitis Patient is balanitis has been successfully treated with Mycolog cream.    PMH: Past Medical History  Diagnosis Date  . Anemia   . Priapism   . HLD (hyperlipidemia)   . GERD (gastroesophageal reflux disease)   . Erectile dysfunction   . Balanitis   . BPH (benign prostatic hyperplasia)   . Over weight   . Scrotal cyst   . HTN (hypertension)   . Urinary urgency   . Diabetes [facility])     Surgical History: History reviewed. No pertinent past surgical history.  Home Medications:    Medication List       This list is accurate as of: 06/27/15  5:06 PM.  Always use your most recent med list.               amLODipine 5 MG tablet  Commonly known as:  NORVASC  Take 5 mg by mouth  daily.     doxazosin 4 MG tablet  Commonly known as:  CARDURA  Take 4 mg by mouth daily.     finasteride 5 MG tablet  Commonly known as:  PROSCAR  Take 1 tablet (5 mg total) by mouth daily.     fluticasone 50 MCG/ACT nasal spray  Commonly known as:  FLONASE     hydrochlorothiazide 25 MG tablet  Commonly known as:  HYDRODIURIL  Take 25 mg by mouth daily.     lisinopril 40 MG tablet  Commonly known as:  PRINIVIL,ZESTRIL  Take 40 mg by mouth daily.     lovastatin 20 MG tablet  Commonly known as:  MEVACOR  Take 20 mg by mouth at bedtime.     metFORMIN 500 MG tablet  Commonly known as:  GLUCOPHAGE     metoprolol 100 MG tablet  Commonly known as:  LOPRESSOR     nystatin cream  Commonly known as:  MYCOSTATIN     nystatin-triamcinolone cream  Commonly known as:  MYCOLOG II     ranitidine  150 MG tablet  Commonly known as:  ZANTAC  Take 150 mg by mouth 2 (two) times daily.     tamsulosin 0.4 MG Caps capsule  Commonly known as:  FLOMAX  Take 1 capsule (0.4 mg total) by mouth daily.        Allergies: No Known Allergies  Family History: Family History  Problem Relation Age of Onset  . Cancer Mother   . High blood pressure Mother   . Prostate cancer Neg Hx   . Bladder Cancer Neg Hx     Social History:  reports that he has quit smoking. His smoking use included Cigarettes. He has never used smokeless tobacco. He reports that he does not drink alcohol or use illicit drugs.      Physical Exam: BP 175/101 mmHg  Pulse 76  Ht 5\' 8"  (1.727 m)  Wt 221 lb (100.245 kg)  BMI 33.61 kg/m2  Physical Exam  Constitutional: He is oriented to person, place, and time. He appears well-developed and well-nourished.  Presents with wife today  HENT:  Head: Normocephalic and atraumatic.  Neck: Normal range of motion. Neck supple.  Pulmonary/Chest: Effort normal. No respiratory distress.  Abdominal: Soft. Bowel sounds are normal.  Genitourinary: Rectum normal.  Neurological: He is alert and oriented to person, place, and time.  Skin: Skin is warm and dry.  Psychiatric: He has a normal mood and affect.     Laboratory Data: PSA History  2.8 ng/mL on 05/17/2014  2.8 ng/mL on 11/14/2014  Pertinent Imaging: Results for orders placed or performed in visit on 06/06/15  Microscopic Examination  Result Value Ref Range   WBC, UA None seen 0 -  5 /hpf   RBC, UA 0-2 0 -  2 /hpf   Epithelial Cells (non renal) None seen 0 - 10 /hpf   Bacteria, UA None seen None seen/Few  Urinalysis, Complete  Result Value Ref Range   Specific Gravity, UA 1.010 1.005 - 1.030   pH, UA 7.0 5.0 - 7.5   Color, UA Yellow Yellow   Appearance Ur Clear Clear   Leukocytes, UA Negative Negative   Protein, UA Negative Negative/Trace   Glucose, UA Negative Negative   Ketones, UA Negative Negative   RBC,  UA Trace (A) Negative   Bilirubin, UA Negative Negative   Urobilinogen, Ur 0.2 0.2 - 1.0 mg/dL   Nitrite, UA Negative Negative   Microscopic Examination See below:  Transrectal ultrasound/ prostate sizing  Patient was positioned in the lateral decubitus position.Lidojet was applied per rectum. Sphincter tone normal.   Transrectal ultrasound probe was introduced without difficulty. Prostate appeared normal with some areas of calcification in the transition zone. Prostate volume was measured, with 5.67 cm by height 5.84 cm by length 6.49 cm. Total volume calculated at 112 mL's.  Slight intravesical protrusion noted. No areas of abnormality or hypoechoic lesions.  Rectal ultrasound probe was removed. Patient tolerated ultrasound well.    Assessment & Plan:    1. BPH (benign prostatic hyperplasia) with LUTS:    At this point, patient has failed maximal medical management of BPH/ LUTS/ BOO with refractory irritative and obstructive voiding symptoms. Prostatic fossa does have a significantly obstructive appearance.  TRUS vol 112 cc.    Options discussed at length today. Patient is interested in pursuing holmium laser enucleation of the prostate with morcellation. We discussed the procedure at length today including the postoperative course requiring a Foley catheter in outpatient surgery. We discussed the risks of the procedure including failure of the procedure to resolve his symptoms, ongoing irritative or worsening of his irritative voiding symptoms, stress or urge incontinence which typically is transient for a few months postoperatively but Grout persist, bleeding, infection, damage to surrounding structures including the bladder, retrograde ejaculation, stricture formation, amongst others. All of his questions were answered in detail.  I reiterated that it is very typical for temporary worsening of his irritative voiding symptoms postoperatively and there is a period of bladder  rehabilitation. He understands this is willing to proceed as planned.  2. Spermatocele:   Patient will notify us of any changes found on testicular exams.    3. Balanitis:  Resolved.     Schedule HoLEP with morcellation   Hollice Espy, MD  [facility] [address] [place], [place] [address] [phone]  I spent 25 min with this patient of which greater than 50% was spent in counseling and coordination of care with the patient.

## 2015-06-29 ENCOUNTER — Telehealth: Payer: Self-pay | Admitting: Radiology

## 2015-06-29 NOTE — Telephone Encounter (Signed)
Notified pt's wife of surgery scheduled 07/11/15, pre-admit testing appt on 07/03/15 @8 :15 and to call day prior to surgery for arrival time to SDS. Wife voices understanding.

## 2015-07-03 ENCOUNTER — Telehealth: Payer: Self-pay | Admitting: Radiology

## 2015-07-03 ENCOUNTER — Encounter
Admission: RE | Admit: 2015-07-03 | Discharge: 2015-07-03 | Disposition: A | Discharge status: Home / Self Care | Payer: Medicare Other | Source: Ambulatory Visit | Attending: Urology | Admitting: Urology

## 2015-07-03 DIAGNOSIS — Z01810 Encounter for preprocedural cardiovascular examination: Secondary | ICD-10-CM | POA: Insufficient documentation

## 2015-07-03 DIAGNOSIS — Z01812 Encounter for preprocedural laboratory examination: Secondary | ICD-10-CM | POA: Diagnosis present

## 2015-07-03 HISTORY — DX: Chronic kidney disease, unspecified: N18.9

## 2015-07-03 LAB — URINALYSIS COMPLETE WITH MICROSCOPIC (ARMC ONLY)
BACTERIA UA: NONE SEEN
Bilirubin Urine: NEGATIVE
GLUCOSE, UA: 150 mg/dL — AB
HGB URINE DIPSTICK: NEGATIVE
Ketones, ur: NEGATIVE mg/dL
LEUKOCYTES UA: NEGATIVE
NITRITE: NEGATIVE
Protein, ur: NEGATIVE mg/dL
SPECIFIC GRAVITY, URINE: 1.013 (ref 1.005–1.030)
pH: 7 (ref 5.0–8.0)

## 2015-07-03 LAB — BASIC METABOLIC PANEL
Anion gap: 5 (ref 5–15)
BUN: 19 mg/dL (ref 6–20)
CALCIUM: 10 mg/dL (ref 8.9–10.3)
CHLORIDE: 104 mmol/L (ref 101–111)
CO2: 29 mmol/L (ref 22–32)
CREATININE: 1.16 mg/dL (ref 0.61–1.24)
GFR calc non Af Amer: >60 mL/min (ref >60)
GLUCOSE: 121 mg/dL — AB (ref 65–99)
Potassium: 3.2 mmol/L — ABNORMAL LOW (ref 3.5–5.1)
Sodium: 138 mmol/L (ref 135–145)

## 2015-07-03 LAB — CBC
HEMATOCRIT: 44.4 % (ref 40.0–52.0)
HEMOGLOBIN: 14.7 g/dL (ref 13.0–18.0)
MCH: 27.3 pg (ref 26.0–34.0)
MCHC: 33 g/dL (ref 32.0–36.0)
MCV: 82.5 fL (ref 80.0–100.0)
Platelets: 179 10*3/uL (ref 150–440)
RBC: 5.38 MIL/uL (ref 4.40–5.90)
RDW: 16.1 % — ABNORMAL HIGH (ref 11.5–14.5)
WBC: 6.8 10*3/uL (ref 3.8–10.6)

## 2015-07-03 NOTE — Patient Instructions (Signed)
  Your procedure is scheduled [id number] 18, 2017 (Tuesday) Report to Day Surgery.([facility]) SECOND FLOOR To find out your arrival time please call [phone] between 1PM - 3PM on July 10, 2015 (Monday).  Remember: Instructions that are not followed completely [place] result in serious medical risk, up to and including death, or upon the discretion of your surgeon and anesthesiologist your surgery [place] need to be rescheduled.    __x__ 1. Do not eat food or drink liquids after midnight. No gum chewing or hard candies.     ____ 2. No Alcohol for 24 hours before or after surgery.   ____ 3. Bring all medications with you on the day of surgery if instructed.    __x__ 4. Notify your doctor if there is any change in your medical condition     (cold, fever, infections).     Do not wear jewelry, make-up, hairpins, clips or nail polish.  Do not wear lotions, powders, or perfumes. You [place] wear deodorant.  Do not shave 48 hours prior to surgery. Men [place] shave face and neck.  Do not bring valuables to the hospital.    [facility] is not responsible for any belongings or valuables.               Contacts, dentures or bridgework [place] not be worn into surgery.  Leave your suitcase in the car. After surgery it [place] be brought to your room.  For patients admitted to the hospital, discharge time is determined by your                treatment team.   Patients discharged the day of surgery will not be allowed to drive home.   Please read over the following fact sheets that you were given:   Surgical Site Infection Prevention   _x___ Take these medicines the morning of surgery with A SIP OF WATER:    1. Amlodipine  2. Lisinopril  3. Metoprolol  4.Ranitidine  5.  6.  ____ Fleet Enema (as directed)   ____ Use CHG Soap as directed  ____ Use inhalers on the day of surgery  __x__ Stop metformin 2 days prior to surgery (Stop Metformin on July 09, 2015)    ____ Take 1/2 of usual insulin dose the  night before surgery and none on the morning of surgery.   __x__ Stop Coumadin/Plavix/aspirin on (N/A)  __x__ Stop Anti-inflammatories on (NO NSAIDS) Tylenol ok to take  for pain if needed   ____ Stop supplements until after surgery.    ____ Bring C-Pap to the hospital.

## 2015-07-03 NOTE — Telephone Encounter (Signed)
Recheck on AM of surgery.    Hollice Espy, MD

## 2015-07-03 NOTE — Pre-Procedure Instructions (Signed)
Met B results (low potassium ) called and faxed to Amy (Dr. Erlene Quan office).

## 2015-07-03 NOTE — Telephone Encounter (Signed)
Please note K+ level of 3.2 drawn 07/03/15 & advise.

## 2015-07-05 LAB — URINE CULTURE: Culture: NO GROWTH

## 2015-07-11 ENCOUNTER — Ambulatory Visit
Admission: RE | Admit: 2015-07-11 | Discharge: 2015-07-11 | Disposition: A | Discharge status: Home / Self Care | Payer: Medicare Other | Source: Ambulatory Visit | Attending: Urology | Admitting: Urology

## 2015-07-11 ENCOUNTER — Encounter
Admission: RE | Disposition: A | Discharge status: Home / Self Care | Payer: Self-pay | Source: Ambulatory Visit | Attending: Urology

## 2015-07-11 ENCOUNTER — Ambulatory Visit: Payer: Medicare Other | Admitting: Anesthesiology

## 2015-07-11 ENCOUNTER — Encounter: Payer: Self-pay | Admitting: *Deleted

## 2015-07-11 DIAGNOSIS — R3912 Poor urinary stream: Secondary | ICD-10-CM | POA: Diagnosis not present

## 2015-07-11 DIAGNOSIS — N138 Other obstructive and reflux uropathy: Secondary | ICD-10-CM | POA: Diagnosis not present

## 2015-07-11 DIAGNOSIS — R3915 Urgency of urination: Secondary | ICD-10-CM | POA: Diagnosis not present

## 2015-07-11 DIAGNOSIS — N3941 Urge incontinence: Secondary | ICD-10-CM | POA: Diagnosis not present

## 2015-07-11 DIAGNOSIS — R351 Nocturia: Secondary | ICD-10-CM | POA: Diagnosis not present

## 2015-07-11 DIAGNOSIS — N401 Enlarged prostate with lower urinary tract symptoms: Secondary | ICD-10-CM | POA: Diagnosis present

## 2015-07-11 DIAGNOSIS — R3914 Feeling of incomplete bladder emptying: Secondary | ICD-10-CM | POA: Insufficient documentation

## 2015-07-11 DIAGNOSIS — R350 Frequency of micturition: Secondary | ICD-10-CM | POA: Diagnosis not present

## 2015-07-11 HISTORY — PX: HOLEP-LASER ENUCLEATION OF THE PROSTATE WITH MORCELLATION: SHX6641

## 2015-07-11 LAB — POCT I-STAT 4, (NA,K, GLUC, HGB,HCT)
Glucose, Bld: 133 mg/dL — ABNORMAL HIGH (ref 65–99)
HCT: 48 % (ref 39.0–52.0)
Hemoglobin: 16.3 g/dL (ref 13.0–17.0)
Potassium: 3.3 mmol/L — ABNORMAL LOW (ref 3.5–5.1)
Sodium: 141 mmol/L (ref 135–145)

## 2015-07-11 LAB — GLUCOSE, CAPILLARY
GLUCOSE-CAPILLARY: 118 mg/dL — AB (ref 65–99)
Glucose-Capillary: 126 mg/dL — ABNORMAL HIGH (ref 65–99)

## 2015-07-11 SURGERY — ENUCLEATION, PROSTATE, USING LASER, WITH MORCELLATION
Anesthesia: General

## 2015-07-11 MED ORDER — FENTANYL CITRATE (PF) 100 MCG/2ML IJ SOLN
INTRAMUSCULAR | Status: DC | PRN
  Administered 2015-07-11 (×2): 250 ug via INTRAVENOUS

## 2015-07-11 MED ORDER — EPHEDRINE SULFATE 50 MG/ML IJ SOLN
INTRAMUSCULAR | Status: DC | PRN
  Administered 2015-07-11: 10 mg via INTRAVENOUS
  Administered 2015-07-11: 15 mg via INTRAVENOUS

## 2015-07-11 MED ORDER — FENTANYL CITRATE (PF) 100 MCG/2ML IJ SOLN: 25.0000 ug | INTRAMUSCULAR | Status: DC | PRN

## 2015-07-11 MED ORDER — MIDAZOLAM HCL 2 MG/2ML IJ SOLN
INTRAMUSCULAR | Status: DC | PRN
  Administered 2015-07-11: 1 mg via INTRAVENOUS

## 2015-07-11 MED ORDER — ONDANSETRON HCL 4 MG/2ML IJ SOLN
INTRAMUSCULAR | Status: DC | PRN
  Administered 2015-07-11: 4 mg via INTRAVENOUS

## 2015-07-11 MED ORDER — HYDROCODONE-ACETAMINOPHEN 5-325 MG PO TABS: 1.0000 | ORAL_TABLET | Freq: Four times a day (QID) | ORAL | Status: DC | PRN

## 2015-07-11 MED ORDER — ACETAMINOPHEN 10 MG/ML IV SOLN
INTRAVENOUS | Status: AC
  Filled 2015-07-11: qty 100

## 2015-07-11 MED ORDER — SODIUM CHLORIDE 0.9 % IV SOLN
INTRAVENOUS | Status: DC
  Administered 2015-07-11 (×2): via INTRAVENOUS

## 2015-07-11 MED ORDER — ONDANSETRON HCL 4 MG/2ML IJ SOLN: 4.0000 mg | Freq: Once | INTRAMUSCULAR | Status: DC | PRN

## 2015-07-11 MED ORDER — SODIUM CHLORIDE 0.9 % IR SOLN
Status: DC | PRN
  Administered 2015-07-11: 39000 mL via INTRAVESICAL

## 2015-07-11 MED ORDER — BELLADONNA ALKALOIDS-OPIUM 16.2-60 MG RE SUPP
1.0000 | Freq: Three times a day (TID) | RECTAL | Status: DC | PRN
  Filled 2015-07-11: qty 1

## 2015-07-11 MED ORDER — FUROSEMIDE 10 MG/ML IJ SOLN
INTRAMUSCULAR | Status: DC | PRN
  Administered 2015-07-11: 10 mg via INTRAMUSCULAR

## 2015-07-11 MED ORDER — GLYCOPYRROLATE 0.2 MG/ML IJ SOLN
INTRAMUSCULAR | Status: DC | PRN
  Administered 2015-07-11: 0.4 mg via INTRAVENOUS
  Administered 2015-07-11 (×2): 0.2 mg via INTRAVENOUS

## 2015-07-11 MED ORDER — CEFAZOLIN SODIUM-DEXTROSE 2-4 GM/100ML-% IV SOLN
2.0000 g | Freq: Once | INTRAVENOUS | Status: AC
  Administered 2015-07-11: 2 g via INTRAVENOUS

## 2015-07-11 MED ORDER — NEOSTIGMINE METHYLSULFATE 10 MG/10ML IV SOLN
INTRAVENOUS | Status: DC | PRN
  Administered 2015-07-11: 3 mg via INTRAVENOUS

## 2015-07-11 MED ORDER — ACETAMINOPHEN 10 MG/ML IV SOLN
INTRAVENOUS | Status: DC | PRN
  Administered 2015-07-11: 1000 mg via INTRAVENOUS

## 2015-07-11 MED ORDER — LIDOCAINE HCL (CARDIAC) 20 MG/ML IV SOLN
INTRAVENOUS | Status: DC | PRN
  Administered 2015-07-11: 100 mg via INTRAVENOUS

## 2015-07-11 MED ORDER — ROCURONIUM BROMIDE 100 MG/10ML IV SOLN
INTRAVENOUS | Status: DC | PRN
  Administered 2015-07-11: 50 mg via INTRAVENOUS
  Administered 2015-07-11 (×2): 10 mg via INTRAVENOUS
  Administered 2015-07-11: 30 mg via INTRAVENOUS

## 2015-07-11 MED ORDER — CEFAZOLIN SODIUM-DEXTROSE 2-4 GM/100ML-% IV SOLN
INTRAVENOUS | Status: AC
  Administered 2015-07-11: 2 g via INTRAVENOUS
  Filled 2015-07-11: qty 100

## 2015-07-11 MED ORDER — DOCUSATE SODIUM 100 MG PO CAPS
100.0000 mg | ORAL_CAPSULE | Freq: Two times a day (BID) | ORAL | Status: DC
Start: 2015-07-11 — End: 2017-05-08

## 2015-07-11 MED ORDER — OXYBUTYNIN CHLORIDE 5 MG PO TABS: 5.0000 mg | ORAL_TABLET | Freq: Three times a day (TID) | ORAL | Status: DC | PRN

## 2015-07-11 MED ORDER — PROPOFOL 10 MG/ML IV BOLUS
INTRAVENOUS | Status: DC | PRN
Start: 2015-07-11 — End: 2015-07-11
  Administered 2015-07-11: 170 mg via INTRAVENOUS
  Administered 2015-07-11: 30 mg via INTRAVENOUS

## 2015-07-11 SURGICAL SUPPLY — 37 items
ADAPTER IRRIG TUBE 2 SPIKE SOL (ADAPTER) ×6 IMPLANT
BAG URO DRAIN 2000ML W/SPOUT (MISCELLANEOUS) ×3 IMPLANT
BAG URO DRAIN 4000ML (MISCELLANEOUS) IMPLANT
CATH FOL 2WAY LX 20X30 (CATHETERS) IMPLANT
CATH FOL LEG HOLDER (MISCELLANEOUS) ×3 IMPLANT
CATH FOLEY 3WAY 30CC 22FR (CATHETERS) IMPLANT
CATH URETL 5X70 OPEN END (CATHETERS) ×3 IMPLANT
CNTNR SPEC 2.5X3XGRAD LEK (MISCELLANEOUS) ×1
CONT SPEC 4OZ STER OR WHT (MISCELLANEOUS) ×2
CONTAINER COLLECT MORCELLATR (MISCELLANEOUS) ×1 IMPLANT
CONTAINER SPEC 2.5X3XGRAD LEK (MISCELLANEOUS) ×1 IMPLANT
DRAPE SHEET LG 3/4 BI-LAMINATE (DRAPES) ×3 IMPLANT
DRAPE UTILITY 15X26 TOWEL STRL (DRAPES) ×3 IMPLANT
FILTER OVERFLOW MORCELLATOR (FILTER) ×1 IMPLANT
GLOVE BIO SURGEON STRL SZ 6.5 (GLOVE) ×6 IMPLANT
GLOVE BIO SURGEONS STRL SZ 6.5 (GLOVE) ×3
GOWN STRL REUS W/ TWL LRG LVL3 (GOWN DISPOSABLE) ×2 IMPLANT
GOWN STRL REUS W/TWL LRG LVL3 (GOWN DISPOSABLE) ×4
HOLDER FOLEY CATH W/STRAP (MISCELLANEOUS) ×3 IMPLANT
KIT RM TURNOVER CYSTO AR (KITS) ×3 IMPLANT
LASER FIBER 550M SMARTSCOPE (Laser) ×3 IMPLANT
MORCELLATOR COLLECT CONTAINER (MISCELLANEOUS) ×3
MORCELLATOR OVERFLOW FILTER (FILTER) ×3
MORCELLATOR ROTATION 4.75 335 (MISCELLANEOUS) ×3 IMPLANT
PACK CYSTO AR (MISCELLANEOUS) ×3 IMPLANT
PREP PVP WINGED SPONGE (MISCELLANEOUS) ×3 IMPLANT
PUMP SINGLE ACTION SAP (PUMP) IMPLANT
SENSORWIRE 0.038 NOT ANGLED (WIRE) ×6
SET IRRIG Y TYPE TUR BLADDER L (SET/KITS/TRAYS/PACK) ×3 IMPLANT
SOL .9 NS 3000ML IRR  AL (IV SOLUTION) ×26
SOL .9 NS 3000ML IRR UROMATIC (IV SOLUTION) ×13 IMPLANT
SYRINGE IRR TOOMEY STRL 70CC (SYRINGE) ×3 IMPLANT
TUBE PUMP MORCELLATOR PIRANHA (TUBING) ×3 IMPLANT
TUBING CONNECTING 10 (TUBING) ×2 IMPLANT
TUBING CONNECTING 10' (TUBING) ×1
WATER STERILE IRR 1000ML POUR (IV SOLUTION) ×3 IMPLANT
WIRE SENSOR 0.038 NOT ANGLED (WIRE) ×2 IMPLANT

## 2015-07-11 NOTE — Interval H&P Note (Signed)
History and Physical Interval Note:  07/11/2015 7:22 AM  Brandon Benjamin  has presented today for surgery, with the diagnosis of BPH WITH BOO  The various methods of treatment have been discussed with the patient and family. After consideration of risks, benefits and other options for treatment, the patient has consented to  Procedure(s): [place] WITH MORCELLATION (N/A) as a surgical intervention .  The patient's history has been reviewed, patient examined, no change in status, stable for surgery.  I have reviewed the patient's chart and labs.  Questions were answered to the patient's satisfaction.    RRR CTAB   Hollice Espy

## 2015-07-11 NOTE — Anesthesia Preprocedure Evaluation (Signed)
Anesthesia Evaluation  Patient identified by MRN, date of birth, ID band Patient awake    Reviewed: Allergy & Precautions, NPO status , Patient's Chart, lab work & pertinent test results, reviewed documented beta blocker date and time   Airway Mallampati: II  TM Distance: >3 FB Neck ROM: Full    Dental  (+) Edentulous Lower, Edentulous Upper   Pulmonary former smoker,    Pulmonary exam normal breath sounds clear to auscultation       Cardiovascular hypertension, Pt. on medications and Pt. on home beta blockers Normal cardiovascular exam     Neuro/Psych negative neurological ROS  negative psych ROS   GI/Hepatic Neg liver ROS, GERD  Medicated and Controlled,  Endo/Other  diabetes, Well Controlled, Type 2, Oral Hypoglycemic Agents  Renal/GU Renal InsufficiencyRenal disease     Musculoskeletal negative musculoskeletal ROS (+)   Abdominal Normal abdominal exam  (+)   Peds negative pediatric ROS (+)  Hematology  (+) anemia ,   Anesthesia Other Findings Prostate BPH  Reproductive/Obstetrics                             Anesthesia Physical Anesthesia Plan  ASA: III  Anesthesia Plan: General   Post-op Pain Management:    Induction: Intravenous  Airway Management Planned: Oral ETT  Additional Equipment:   Intra-op Plan:   Post-operative Plan: Extubation in OR  Informed Consent: I have reviewed the patients History and Physical, chart, labs and discussed the procedure including the risks, benefits and alternatives for the proposed anesthesia with the patient or authorized representative who has indicated his/her understanding and acceptance.   Dental advisory given  Plan Discussed with: Surgeon and CRNA  Anesthesia Plan Comments:         Anesthesia Quick Evaluation

## 2015-07-11 NOTE — H&P (View-Only) (Signed)
5:06 PM  06/27/2015   Brandon Benjamin Dec 01, 1945 [id number]  Referring provider: Letta Median, MD [place], [place] [id number]  Chief Complaint  Patient presents with  . TURS    HPI: Mr. Brandon Benjamin is a 70 year old white male with BPH and LUTS presents today for TRUS volume of prostate for surgical planning for management of his refractory BPH with BOO.  He also has a history of a spermatocele and balanitis.  BPH WITH LUTS Currently on finasteride and flomax with significant refractory symptoms  Je has mixed obstructive and irritative voiding symptoms including weak stream, history of incomplete bladder emptying with elevated PVRs, stopping starting of his urinary stream along with urinary frequency , urgency, and rare urge incontinence. He also has nocturia 3-4. He is very unhappy with his overall voiding symptoms. Rectal exam on 05/18/15 with enlarged 60 cc gland, no nodules. PSA 2.8 on 8/26 No UTIs or bladder stones TRUS volume today 112 cc gland Cystoscopy with trilobar coaptation, 6 cm or prosthetic length, elevated bladder neck, and subtle intravesical protrusion of an enlarged prostate gland      IPSS      05/18/15 1300       International Prostate Symptom Score   How often have you had the sensation of not emptying your bladder? More than half the time     How often have you had to urinate less than every two hours? Less than half the time     How often have you found you stopped and started again several times when you urinated? Less than half the time     How often have you found it difficult to postpone urination? Almost always     How often have you had a weak urinary stream? Almost always     How often have you had to strain to start urination? Almost always     How many times did you typically get up at night to urinate? 4 Times     Total IPSS Score 27     Quality of Life due to urinary symptoms   If you were to spend the rest of your  life with your urinary condition just the way it is now how would you feel about that? Mostly Disatisfied        Score:  1-7 Mild 8-19 Moderate 20-35 Severe  Spermatocele Scrotal ultrasound performed on 05/05/2014 noted a large septated cyst superiorly in the left scrotum, possibly arising from the epididymis. No typical hydrocele. He has not noted any changes on testicular self exams.  Balanitis Patient is balanitis has been successfully treated with Mycolog cream.    PMH: Past Medical History  Diagnosis Date  . Anemia   . Priapism   . HLD (hyperlipidemia)   . GERD (gastroesophageal reflux disease)   . Erectile dysfunction   . Balanitis   . BPH (benign prostatic hyperplasia)   . Over weight   . Scrotal cyst   . HTN (hypertension)   . Urinary urgency   . Diabetes [facility])     Surgical History: History reviewed. No pertinent past surgical history.  Home Medications:    Medication List       This list is accurate as of: 06/27/15  5:06 PM.  Always use your most recent med list.               amLODipine 5 MG tablet  Commonly known as:  NORVASC  Take 5 mg by mouth  daily.     doxazosin 4 MG tablet  Commonly known as:  CARDURA  Take 4 mg by mouth daily.     finasteride 5 MG tablet  Commonly known as:  PROSCAR  Take 1 tablet (5 mg total) by mouth daily.     fluticasone 50 MCG/ACT nasal spray  Commonly known as:  FLONASE     hydrochlorothiazide 25 MG tablet  Commonly known as:  HYDRODIURIL  Take 25 mg by mouth daily.     lisinopril 40 MG tablet  Commonly known as:  PRINIVIL,ZESTRIL  Take 40 mg by mouth daily.     lovastatin 20 MG tablet  Commonly known as:  MEVACOR  Take 20 mg by mouth at bedtime.     metFORMIN 500 MG tablet  Commonly known as:  GLUCOPHAGE     metoprolol 100 MG tablet  Commonly known as:  LOPRESSOR     nystatin cream  Commonly known as:  MYCOSTATIN     nystatin-triamcinolone cream  Commonly known as:  MYCOLOG II     ranitidine  150 MG tablet  Commonly known as:  ZANTAC  Take 150 mg by mouth 2 (two) times daily.     tamsulosin 0.4 MG Caps capsule  Commonly known as:  FLOMAX  Take 1 capsule (0.4 mg total) by mouth daily.        Allergies: No Known Allergies  Family History: Family History  Problem Relation Age of Onset  . Cancer Mother   . High blood pressure Mother   . Prostate cancer Neg Hx   . Bladder Cancer Neg Hx     Social History:  reports that he has quit smoking. His smoking use included Cigarettes. He has never used smokeless tobacco. He reports that he does not drink alcohol or use illicit drugs.      Physical Exam: BP 175/101 mmHg  Pulse 76  Ht 5\' 8"  (1.727 m)  Wt 221 lb (100.245 kg)  BMI 33.61 kg/m2  Physical Exam  Constitutional: He is oriented to person, place, and time. He appears well-developed and well-nourished.  Presents with wife today  HENT:  Head: Normocephalic and atraumatic.  Neck: Normal range of motion. Neck supple.  Pulmonary/Chest: Effort normal. No respiratory distress.  Abdominal: Soft. Bowel sounds are normal.  Genitourinary: Rectum normal.  Neurological: He is alert and oriented to person, place, and time.  Skin: Skin is warm and dry.  Psychiatric: He has a normal mood and affect.     Laboratory Data: PSA History  2.8 ng/mL on 05/17/2014  2.8 ng/mL on 11/14/2014  Pertinent Imaging: Results for orders placed or performed in visit on 06/06/15  Microscopic Examination  Result Value Ref Range   WBC, UA None seen 0 -  5 /hpf   RBC, UA 0-2 0 -  2 /hpf   Epithelial Cells (non renal) None seen 0 - 10 /hpf   Bacteria, UA None seen None seen/Few  Urinalysis, Complete  Result Value Ref Range   Specific Gravity, UA 1.010 1.005 - 1.030   pH, UA 7.0 5.0 - 7.5   Color, UA Yellow Yellow   Appearance Ur Clear Clear   Leukocytes, UA Negative Negative   Protein, UA Negative Negative/Trace   Glucose, UA Negative Negative   Ketones, UA Negative Negative   RBC,  UA Trace (A) Negative   Bilirubin, UA Negative Negative   Urobilinogen, Ur 0.2 0.2 - 1.0 mg/dL   Nitrite, UA Negative Negative   Microscopic Examination See below:  Transrectal ultrasound/ prostate sizing  Patient was positioned in the lateral decubitus position.Lidojet was applied per rectum. Sphincter tone normal.   Transrectal ultrasound probe was introduced without difficulty. Prostate appeared normal with some areas of calcification in the transition zone. Prostate volume was measured, with 5.67 cm by height 5.84 cm by length 6.49 cm. Total volume calculated at 112 mL's.  Slight intravesical protrusion noted. No areas of abnormality or hypoechoic lesions.  Rectal ultrasound probe was removed. Patient tolerated ultrasound well.    Assessment & Plan:    1. BPH (benign prostatic hyperplasia) with LUTS:    At this point, patient has failed maximal medical management of BPH/ LUTS/ BOO with refractory irritative and obstructive voiding symptoms. Prostatic fossa does have a significantly obstructive appearance.  TRUS vol 112 cc.    Options discussed at length today. Patient is interested in pursuing holmium laser enucleation of the prostate with morcellation. We discussed the procedure at length today including the postoperative course requiring a Foley catheter in outpatient surgery. We discussed the risks of the procedure including failure of the procedure to resolve his symptoms, ongoing irritative or worsening of his irritative voiding symptoms, stress or urge incontinence which typically is transient for a few months postoperatively but Tatro persist, bleeding, infection, damage to surrounding structures including the bladder, retrograde ejaculation, stricture formation, amongst others. All of his questions were answered in detail.  I reiterated that it is very typical for temporary worsening of his irritative voiding symptoms postoperatively and there is a period of bladder  rehabilitation. He understands this is willing to proceed as planned.  2. Spermatocele:   Patient will notify us of any changes found on testicular exams.    3. Balanitis:  Resolved.     Schedule HoLEP with morcellation   Hollice Espy, MD  [facility] [address] [place], [place] [address] [phone]  I spent 25 min with this patient of which greater than 50% was spent in counseling and coordination of care with the patient.

## 2015-07-11 NOTE — Anesthesia Procedure Notes (Signed)
Procedure Name: Intubation Date/Time: 07/11/2015 7:43 AM Performed by: Rosaria Ferries, Jason Hauge Pre-anesthesia Checklist: Patient identified, Emergency Drugs available, Suction available and Patient being monitored Patient Re-evaluated:Patient Re-evaluated prior to inductionOxygen Delivery Method: Circle system utilized Preoxygenation: Pre-oxygenation with 100% oxygen Intubation Type: IV induction Laryngoscope Size: Mac and 3 Grade View: Grade I Number of attempts: 1 Placement Confirmation: ETT inserted through vocal cords under direct vision,  positive ETCO2 and breath sounds checked- equal and bilateral Secured at: 23 cm Tube secured with: Tape Dental Injury: Teeth and Oropharynx as per pre-operative assessment  Comments: 3-5 ml of bile in vallecular space. Glottis clean. No mask ventilations performed before intubation. Oropharynx suctioned after intubation.

## 2015-07-11 NOTE — Transfer of Care (Signed)
Immediate Anesthesia Transfer of Care Note  Patient: Brandon Benjamin  Procedure(s) Performed: Procedure(s): HOLEP-LASER ENUCLEATION OF THE PROSTATE WITH MORCELLATION (N/A)  Patient Location: PACU  Anesthesia Type:General  Level of Consciousness: awake, alert , oriented and patient cooperative  Airway & Oxygen Therapy: Patient Spontanous Breathing and Patient connected to nasal cannula oxygen  Post-op Assessment: Report given to RN and Post -op Vital signs reviewed and stable  Post vital signs: Reviewed and stable  Last Vitals:  Filed Vitals:   07/11/15 0614 07/11/15 1030  BP: 176/104 138/92  Pulse: 63 68  Temp: 36.7 C 36.4 C  Resp: 16 12    Complications: No apparent anesthesia complications

## 2015-07-11 NOTE — Discharge Instructions (Signed)
Foley Catheter Care, Adult A Foley catheter is a soft, flexible tube. This tube is placed into your bladder to drain pee (urine). If you go home with this catheter in place, follow the instructions below. TAKING CARE OF THE CATHETER 1. Wash your hands with soap and water. 2. Put soap and water on a clean washcloth.  Clean the skin where the tube goes into your body.  Clean away from the tube site.  Never wipe toward the tube.  Clean the area using a circular motion.  Remove all the soap. Pat the area dry with a clean towel. For males, reposition the skin that covers the end of the penis (foreskin). 3. Attach the tube to your leg with tape or a leg strap. Do not stretch the tube tight. If you are using tape, remove any stickiness left behind by past tape you used. 4. Keep the drainage bag below your hips. Keep it off the floor. 5. Check your tube during the day. Make sure it is working and draining. Make sure the tube does not curl, twist, or bend. 6. Do not pull on the tube or try to take it out. TAKING CARE OF THE DRAINAGE BAGS You will have a large overnight drainage bag and a small leg bag. You [place] wear the overnight bag any time. Never wear the small bag at night. Follow the directions below. Emptying the Drainage Bag Empty your drainage bag when it is  - full or at least 2-3 times a day. 1. Wash your hands with soap and water. 2. Keep the drainage bag below your hips. 3. Hold the dirty bag over the toilet or clean container. 4. Open the pour spout at the bottom of the bag. Empty the pee into the toilet or container. Do not let the pour spout touch anything. 5. Clean the pour spout with a gauze pad or cotton ball that has rubbing alcohol on it. 6. Close the pour spout. 7. Attach the bag to your leg with tape or a leg strap. 8. Wash your hands well. Changing the Drainage Bag Change your bag once a month or sooner if it starts to smell or look dirty.  1. Wash your hands with soap  and water. 2. Pinch the rubber tube so that pee does not spill out. 3. Disconnect the catheter tube from the drainage tube at the connection valve. Do not let the tubes touch anything. 4. Clean the end of the catheter tube with an alcohol wipe. Clean the end of a the drainage tube with a different alcohol wipe. 5. Connect the catheter tube to the drainage tube of the clean drainage bag. 6. Attach the new bag to the leg with tape or a leg strap. Avoid attaching the new bag too tightly. 7. Wash your hands well. Cleaning the Drainage Bag 1. Wash your hands with soap and water. 2. Wash the bag in warm, soapy water. 3. Rinse the bag with warm water. 4. Fill the bag with a mixture of white vinegar and water (1 cup vinegar to 1 quart warm water [.2 liter vinegar to 1 liter warm water]). Close the bag and soak it for 30 minutes in the solution. 5. Rinse the bag with warm water. 6. Hang the bag to dry with the pour spout open and hanging downward. 7. Store the clean bag (once it is dry) in a clean plastic bag. 8. Wash your hands well. PREVENT INFECTION  Wash your hands before and after touching your tube.  Take showers every day. Wash the skin where the tube enters your body. Do not take baths. Replace wet leg straps with dry ones, if this applies.  Do not use powders, sprays, or lotions on the genital area. Only use creams, lotions, or ointments as told by your doctor.  For females, wipe from front to back after going to the bathroom.  Drink enough fluids to keep your pee clear or pale yellow unless you are told not to have too much fluid (fluid restriction).  Do not let the drainage bag or tubing touch or lie on the floor.  Wear cotton underwear to keep the area dry. GET HELP IF:  Your pee is cloudy or smells unusually bad.  Your tube becomes clogged.  You are not draining pee into the bag or your bladder feels full.  Your tube starts to leak. GET HELP RIGHT AWAY IF:  You have  pain, puffiness (swelling), redness, or yellowish-white fluid (pus) where the tube enters the body.  You have pain in the belly (abdomen), legs, lower back, or bladder.  You have a fever.  You see blood fill the tube, or your pee is pink or red.  You feel sick to your stomach (nauseous), throw up (vomit), or have chills.  Your tube gets pulled out. MAKE SURE YOU:   Understand these instructions.  Will watch your condition.  Will get help right away if you are not doing well or get worse.   This information is not intended to replace advice given to you by your health care provider. Make sure you discuss any questions you have with your health care provider.    You will remove your own Foley on Wednesday for 22,017.  To do this, you will attach the supplied syringe and drain all 30 mL's from the balloon port site. Once this is done, the Foley can be gently pulled and removed in entirety. If you have any questions or concerns, please call our office. If you're not comfortable doing this or self: Please call our office for a nurse visit appointment.     Document Released: 07/06/2012 Document Revised: 04/01/2014 Document Reviewed: 07/06/2012 Elsevier Interactive Patient Education 2016 [place]   1) The drugs that you were given will stay in your system until tomorrow so for the next 24 hours you should not:  A) Drive an automobile B) Make any legal decisions C) Drink any alcoholic beverage   2) You [place] resume regular meals tomorrow.  Today it is better to start with liquids and gradually work up to solid foods.  You [place] eat anything you prefer, but it is better to start with liquids, then soup and crackers, and gradually work up to solid foods.   3) Please notify your doctor immediately if you have any unusual bleeding, trouble breathing, redness and pain at the surgery site, drainage, fever, or pain not relieved by  medication.    4) Additional Instructions:        Please contact your physician with any problems or Same Day Surgery at [phone], Monday through Friday 6 am to 4 pm, or [place] at [facility] number at [phone].

## 2015-07-12 NOTE — Op Note (Signed)
Date of procedure: 07/12/2015  Preoperative diagnosis:  1. BPH with bladder outlet obstruction   Postoperative diagnosis:  1. BPH with bladder outlet obstruction   Procedure: 1. Holmium laser enucleation of the prostate with morcellation  Surgeon: Hollice Espy, MD  Anesthesia: General  Complications: None  Intraoperative findings: Large 7 cm prostatic length, bilobar coaptation with elevated bladder neck.  UOs clearly identified at the end of the case with clear reflux from both.   EBL: 200 cc  Specimens: Prostate chips  Drains: 24 French two-way Foley catheter with 30 cc balloon   Indication: Brandon Benjamin is a 70 y.o. patient with significant urinary symptoms on maximal medical therapy including evidence of bladder outlet obstruction and obstructive in a 110 g prostate.  After reviewing the management options for treatment, he elected to proceed with the above surgical procedure(s). We have discussed the potential benefits and risks of the procedure, side effects of the proposed treatment, the likelihood of the patient achieving the goals of the procedure, and any potential problems that might occur during the procedure or recuperation. Informed consent has been obtained.  Description of procedure:  The patient was taken to the operating room and general anesthesia was induced.  The patient was placed in the dorsal lithotomy position, prepped and draped in the usual sterile fashion, and preoperative antibiotics were administered. A preoperative time-out was performed.   At this point in time, a rigid resectoscope using the 26 French access sheath was advanced using a visual obturator into the bladder. Inspection of the prostatic fossa revealed significant bilobar coaptation with no clearly defined median lobe and elevated bladder neck. The prostatic length was approximately 7 cm.   The bladder itself was mildly trabeculated. The UOs were identified in all the short distance from the  bladder neck is a or somewhat distorted and location due to the enlarged prostate.   At this point in time, 550  laser fiber was brought in and using the settings of approximately 2 J and 50 Hz, 2 incisions were created at the 5:00 and 7:00 positions which were met in the midline just proximal to the veru.  The incisions were deepened to more clearly define the median lobe as well as open up channels for better flow. A plane was then created between the adenoma and the prostatic capsule working in it distal to proximal direction rolling the adenoma towards the bladder neck. Eventually, the mucosa of the bladder neck was able to be cleaved in the median lobe was freed into the bladder. This helped flatten out the bladder neck.  Attention was then turned to the patient's left lateral lobe. This was significantly larger in appearance than the right lateral lobe. A curvilinear incision was made at the apex of the prostate with care taken to avoid resection beyond the verumontanum.  The curvilinear incision was then carried out laterally and cranially towards the bladder neck again creating a plane between the prostatic adenoma and the capsule. In a few occasions, a small amount of fat was identified indicating that this was a sufficiently deep plane and deeper section was avoided. The adenoma was then rolled cranially towards the bladder neck and eventually able to be freed from the bladder neck. A small amount of anterior mucosal tissue was identified at the anterior commissure, and size, and the very large lobe was pushed into the bladder. There is some mild undermining noted the bladder neck but not significant. At this point in time, the prostatic fossa was  inspected and noted to be widely patent. The right lateral lobe appeared to be fairly insignficant at this point and not particularly obstructive.  In order to maximally preserve continence while also adequately reducing his obstruction, I did elect to leave  the smaller right lateral lobe in place.  The scope was then advanced into the bladder and the bladder was filled. Both UOs were again clearly identified and noted to be free of any injury with clear reflux of urine from both. The resectoscope was then exchanged for the nephroscope in a prone a morcellator was brought in. With a full bladder, the left lateral lobe and median lobe were carefully morcellated taking care to avoid any injury to the bladder itself. Once this was achieved, the bladder was then drained. There was no significant bleeding noted at this point. Hemostasis was fairly good and there were no residual fragments noted. The scope was then removed and a 20 [place] two-way Foley catheter was placed into the bladder using a catheter guide. The patient was administered 10 cc milligrams of Lasix to help facilitate diuresis. The patient was then cleaned and dried. The catheter was secured patient's left thigh using a catheter secure device. He was then repositioned the supine position, reversed from anesthesia, taken to the PACU in stable condition.  Plan: Patient will follow-up in 6 weeks for PVR, IPSS. He will remove his own catheter in 2 days and was instructed on how to do this. He will call our office if he is uncomfortable with this or has any questions.   Hollice Espy, M.D.

## 2015-07-12 NOTE — Anesthesia Postprocedure Evaluation (Signed)
Anesthesia Post Note  Patient: Brandon Benjamin  Procedure(s) Performed: Procedure(s) (LRB): HOLEP-LASER ENUCLEATION OF THE PROSTATE WITH MORCELLATION (N/A)  Patient location during evaluation: PACU Anesthesia Type: General Level of consciousness: awake and alert and oriented Pain management: pain level controlled Vital Signs Assessment: post-procedure vital signs reviewed and stable Respiratory status: spontaneous breathing Cardiovascular status: blood pressure returned to baseline Anesthetic complications: no    Last Vitals:  Filed Vitals:   07/11/15 1125 07/11/15 1230  BP: 129/82 139/82  Pulse: 58   Temp: 36.8 C   Resp: 16 16    Last Pain: There were no vitals filed for this visit.               Ligaya Cormier

## 2015-07-13 ENCOUNTER — Telehealth: Payer: Self-pay

## 2015-07-13 ENCOUNTER — Encounter: Payer: Self-pay | Admitting: Urology

## 2015-07-13 NOTE — Telephone Encounter (Signed)
Spoke with patient's wife and notified her of instructions, she verbalized understanding

## 2015-07-13 NOTE — Telephone Encounter (Signed)
Patient's wife called wanting to know if he should stop Flomax and Finesteride after surgery. Per Dr. Erlene Quan ok to stop Finasteride, patient should continue Tamsulosin until 6wk follow up visit and reaccess at that time. Left mess for patient to call to notify of this instruction.

## 2015-07-16 LAB — SURGICAL PATHOLOGY

## 2015-08-23 ENCOUNTER — Ambulatory Visit: Payer: Medicare Other | Admitting: Urology

## 2015-08-25 ENCOUNTER — Ambulatory Visit (INDEPENDENT_AMBULATORY_CARE_PROVIDER_SITE_OTHER): Payer: Medicare Other | Admitting: Urology

## 2015-08-25 ENCOUNTER — Encounter: Payer: Self-pay | Admitting: Urology

## 2015-08-25 VITALS — BP 169/99 | HR 58 | Ht 69.0 in | Wt 216.6 lb

## 2015-08-25 DIAGNOSIS — N401 Enlarged prostate with lower urinary tract symptoms: Secondary | ICD-10-CM

## 2015-08-25 DIAGNOSIS — N138 Other obstructive and reflux uropathy: Secondary | ICD-10-CM

## 2015-08-25 LAB — BLADDER SCAN AMB NON-IMAGING: SCAN RESULT: 50

## 2015-08-25 NOTE — Progress Notes (Signed)
10:02 AM  08/25/2015   Brandon Benjamin 08/01/1945 [id number]  Referring provider: Letta Median, MD [place], [place] [address]  Chief Complaint  Patient presents with  . Routine Post Op    HPI: Mr. Thaker is a 70 year old white male with BPH and LUTS with refractory BPH with BOO s/p HoLEP on 07/11/15.  He also has a history of a spermatocele and balanitis.  BPH WITH LUTS Currently on finasteride and flomax with significant refractory symptoms  Prior to surgery, he had mixed obstructive and irritative voiding symptoms including weak stream, history of incomplete bladder emptying with elevated PVRs, stopping starting of his urinary stream along with urinary frequency , urgency, and rare urge incontinence. He also has nocturia 3-4.  Rectal exam on 05/18/15 with enlarged 60 cc gland, no nodules. PSA 2.8 on 11/18/14 No UTIs or bladder stones TRUS volume today 112 cc gland Cystoscopy with trilobar coaptation, 6 cm or prosthetic length, elevated bladder neck, and subtle intravesical protrusion of an enlarged prostate gland  S/p HoLEP 07/11/15, 61 grams resected pathology benign PVR today 50, IPSS 1/0.  Very happy with result.  He has stopped finasteride after surgery but continues on Flomax.   No dysuria or gross hematuria.  Excellent stream.          IPSS      08/25/15 0900       International Prostate Symptom Score   How often have you had the sensation of not emptying your bladder? Not at All     How often have you had to urinate less than every two hours? Not at All     How often have you found you stopped and started again several times when you urinated? Not at All     How often have you found it difficult to postpone urination? Not at All     How often have you had a weak urinary stream? Not at All     How often have you had to strain to start urination? Not at All     How many times did you typically get up at night to urinate? 1 Time     Total  IPSS Score 1     Quality of Life due to urinary symptoms   If you were to spend the rest of your life with your urinary condition just the way it is now how would you feel about that? Delighted        Score:  1-7 Mild 8-19 Moderate 20-35 Severe  Spermatocele Scrotal ultrasound performed on 05/05/2014 noted a large septated cyst superiorly in the left scrotum, possibly arising from the epididymis. No typical hydrocele. He has not noted any changes on testicular self exams.  Balanitis Patient is balanitis has been successfully treated with Mycolog cream.    PMH: Past Medical History  Diagnosis Date  . Anemia   . Priapism   . HLD (hyperlipidemia)   . GERD (gastroesophageal reflux disease)   . Erectile dysfunction   . Balanitis   . BPH (benign prostatic hyperplasia)   . Over weight   . Scrotal cyst   . HTN (hypertension)   . Urinary urgency   . Diabetes ([place])   . Chronic kidney disease     Surgical History: Past Surgical History  Procedure Laterality Date  . Holep-laser enucleation of the prostate with morcellation N/A 07/11/2015    Procedure: HOLEP-LASER ENUCLEATION OF THE PROSTATE WITH MORCELLATION;  Surgeon: Hollice Espy, MD;  Location: [facility] ORS;  Service: Urology;  Laterality: N/A;    Home Medications:    Medication List       This list is accurate as of: 08/25/15 10:02 AM.  Always use your most recent med list.               amLODipine 5 MG tablet  Commonly known as:  NORVASC  Take 10 mg by mouth daily.     CENTRAVITES 50 PLUS Tabs  Take 1 tablet by mouth every morning.     cetirizine 10 MG tablet  Commonly known as:  ZYRTEC  Take 10 mg by mouth daily.     citalopram 10 MG tablet  Commonly known as:  CELEXA  Take 10 mg by mouth daily.     docusate sodium 100 MG capsule  Commonly known as:  COLACE  Take 1 capsule (100 mg total) by mouth 2 (two) times daily.     fluticasone 50 MCG/ACT nasal spray  Commonly known as:  FLONASE  Place 2 sprays  into both nostrils daily.     hydrochlorothiazide 25 MG tablet  Commonly known as:  HYDRODIURIL  Take 12.5 mg by mouth daily.     lisinopril 40 MG tablet  Commonly known as:  PRINIVIL,ZESTRIL  Take 40 mg by mouth daily.     lovastatin 20 MG tablet  Commonly known as:  MEVACOR  Take 20 mg by mouth at bedtime.     metFORMIN 500 MG tablet  Commonly known as:  GLUCOPHAGE  Take 500 mg by mouth every evening.     metoprolol 100 MG tablet  Commonly known as:  LOPRESSOR  Take 100 mg by mouth 2 (two) times daily.     nystatin cream  Commonly known as:  MYCOSTATIN  Apply 1 application topically as needed.     ranitidine 150 MG tablet  Commonly known as:  ZANTAC  Take 150 mg by mouth 2 (two) times daily.        Allergies: No Known Allergies  Family History: Family History  Problem Relation Age of Onset  . Cancer Mother   . High blood pressure Mother   . Prostate cancer Neg Hx   . Bladder Cancer Neg Hx     Social History:  reports that he has quit smoking. His smoking use included Cigarettes. He has never used smokeless tobacco. He reports that he does not drink alcohol or use illicit drugs.   Psychologic Depression?: No Anxiety?: No  Physical Exam: BP 169/99 mmHg  Pulse 58  Ht 5\' 9"  (1.753 m)  Wt 216 lb 9.6 oz (98.249 kg)  BMI 31.97 kg/m2  Physical Exam  Constitutional: He is oriented to person, place, and time. He appears well-developed and well-nourished.  HENT:  Head: Normocephalic and atraumatic.  Neck: Normal range of motion. Neck supple.  Pulmonary/Chest: Effort normal. No respiratory distress.  Abdominal: Soft. Bowel sounds are normal.  Neurological: He is alert and oriented to person, place, and time.  Skin: Skin is warm and dry.  Psychiatric: He has a normal mood and affect.     Laboratory Data: PSA History  2.8 ng/mL on 05/17/2014  2.8 ng/mL on 11/14/2014  Pertinent Imaging: Results for orders placed or performed in visit on 08/25/15  BLADDER  SCAN AMB NON-IMAGING  Result Value Ref Range   Scan Result 50     Assessment & Plan:    1. BPH (benign prostatic hyperplasia) with LUTS:    S/p excellent result with HoLEP  He has already discontinued finasteride and I would like him to stop the Flomax at this point Recommend follow-up in 1 year with IPSS, DRE, PSA, and post void residual  2. Spermatocele:   Patient will notify us of any changes found on testicular exams.    3. Balanitis:  Resolved.    Return in about 1 year (around 08/24/2016) for PSA/ DRE/ IPSS/ PVR. Hollice Espy, MD  [facility] [address], [place], [place] [address] [phone]

## 2015-09-13 ENCOUNTER — Encounter: Payer: Self-pay | Admitting: Podiatry

## 2015-09-13 ENCOUNTER — Ambulatory Visit (INDEPENDENT_AMBULATORY_CARE_PROVIDER_SITE_OTHER): Payer: Medicare Other | Admitting: Podiatry

## 2015-09-13 DIAGNOSIS — B351 Tinea unguium: Secondary | ICD-10-CM | POA: Diagnosis not present

## 2015-09-13 DIAGNOSIS — M79676 Pain in unspecified toe(s): Secondary | ICD-10-CM

## 2015-09-13 NOTE — Progress Notes (Signed)
He presents today with chief complaint of painful elongated toenails. He states that he had his prostate surgery which was very successful and he is cancer free.  Objective: Her signs are stable he's alert and oriented 3. Pulses are palpable. Toenails are thick yellow dystrophic clinic for mycotic sharp incurvated nail margins with dystrophy. No open lesions or wounds.  Assessment: Pain and limp secondary to onychomycosis.   Plan: Debridement of toenails 1 through 5 bilateral. Follow up with him in 3 months.

## 2015-11-24 ENCOUNTER — Telehealth: Payer: Self-pay | Admitting: Urology

## 2015-11-24 NOTE — Telephone Encounter (Signed)
Pt has another rash and would like some more Nystatin called in to [place] [phone]

## 2015-11-28 NOTE — Telephone Encounter (Signed)
I don't see where I've treated him in the past for a rash.  Where is the rash?  Would he like to be seen?    Hollice Espy, MD

## 2015-11-29 NOTE — Telephone Encounter (Signed)
Spoke with pt wife in reference to rash. Wife stated that rash is in pt groin area. Wife stated that it has been several months since pt needed nystatin last. Please advise.

## 2015-11-29 NOTE — Telephone Encounter (Signed)
LMOM

## 2015-11-29 NOTE — Telephone Encounter (Signed)
His rash is most likely "jock itch."  If he is seeing his PCP soon, they can address this as well.  He should be seen by a provider to make sure this is the case.

## 2015-11-30 NOTE — Telephone Encounter (Signed)
LMOM

## 2015-12-01 NOTE — Telephone Encounter (Signed)
LMOM

## 2015-12-05 NOTE — Telephone Encounter (Signed)
Patient states he has taken care of his rash.

## 2015-12-12 ENCOUNTER — Encounter: Payer: Self-pay | Admitting: Podiatry

## 2015-12-13 ENCOUNTER — Ambulatory Visit (INDEPENDENT_AMBULATORY_CARE_PROVIDER_SITE_OTHER): Payer: Medicare Other | Admitting: Podiatry

## 2015-12-13 DIAGNOSIS — B351 Tinea unguium: Secondary | ICD-10-CM | POA: Diagnosis not present

## 2015-12-13 DIAGNOSIS — M79676 Pain in unspecified toe(s): Secondary | ICD-10-CM

## 2015-12-13 NOTE — Progress Notes (Signed)
Presents today as a chief complaint of painful elongated toenails bilateral.  Objective: Vital signs are stable N 3. Pulses are strongly palpable. Neurologic sensory is intact. Toenails are thick yellow dystrophic mycotic and painful palpation.  Assessment: Pain and limp secondary to onychomycosis 1 through 5 bilateral.  Plan: Debridement of toenails 1 through 5 bilateral. Follow up in 3 months.

## 2015-12-28 ENCOUNTER — Telehealth: Payer: Self-pay | Admitting: Urology

## 2016-01-01 ENCOUNTER — Ambulatory Visit (INDEPENDENT_AMBULATORY_CARE_PROVIDER_SITE_OTHER): Payer: Medicare Other | Admitting: Urology

## 2016-01-01 ENCOUNTER — Encounter: Payer: Self-pay | Admitting: Urology

## 2016-01-01 VITALS — BP 167/93 | HR 56 | Ht 69.0 in | Wt 218.1 lb

## 2016-01-01 DIAGNOSIS — N401 Enlarged prostate with lower urinary tract symptoms: Secondary | ICD-10-CM | POA: Diagnosis not present

## 2016-01-01 DIAGNOSIS — N5082 Scrotal pain: Secondary | ICD-10-CM | POA: Diagnosis not present

## 2016-01-01 DIAGNOSIS — L723 Sebaceous cyst: Secondary | ICD-10-CM

## 2016-01-01 DIAGNOSIS — N138 Other obstructive and reflux uropathy: Secondary | ICD-10-CM | POA: Diagnosis not present

## 2016-01-01 LAB — URINALYSIS, COMPLETE
Bilirubin, UA: NEGATIVE
Ketones, UA: NEGATIVE
Leukocytes, UA: NEGATIVE
NITRITE UA: NEGATIVE
Specific Gravity, UA: 1.02 (ref 1.005–1.030)
Urobilinogen, Ur: 0.2 mg/dL (ref 0.2–1.0)
pH, UA: 6.5 (ref 5.0–7.5)

## 2016-01-01 LAB — MICROSCOPIC EXAMINATION: BACTERIA UA: NONE SEEN

## 2016-01-01 LAB — BLADDER SCAN AMB NON-IMAGING: Scan Result: 97

## 2016-01-01 MED ORDER — CHLORHEXIDINE GLUCONATE 4 % EX LIQD: Freq: Every day | CUTANEOUS | 0 refills | Status: DC | PRN

## 2016-01-01 NOTE — Patient Instructions (Addendum)
Adapalene; Benzoyl Peroxide topical gel What is this medicine? ADAPALENE; BENZOYL PEROXIDE (a DAP a leen; BEN zoe ill per OX ide) is used on the skin to treat acne. This medicine Vines be used for other purposes; ask your health care provider or pharmacist if you have questions. What should I tell my health care provider before I take this medicine? They need to know if you have any of these conditions: -eczema -seborrheic dermatitis -skin abrasions -sunburn -an unusual or allergic reaction to adapalene, benzoyl peroxide, vitamin A, other medicines, foods, dyes, or preservatives -pregnant or trying to get pregnant -breast-feeding How should I use this medicine? This medicine is for external use only. Follow the directions on the prescription label. Cleanse the affected area with a mild or soapless cleanser and pat dry. Apply a thin layer of medicine to the affected area. Rub in gently. Do not get in the eyes, on the lips, or on any other areas of sensitive skin. Use your medicine at regular intervals. Do not use it more often than directed. Talk to your pediatrician regarding the use of this medicine in children. Special care Hudlow be needed. Overdosage: If you think you have taken too much of this medicine contact a poison control center or emergency room at once. NOTE: This medicine is only for you. Do not share this medicine with others. What if I miss a dose? If you miss a dose, use it as soon as you can. If it is almost time for your next dose, use only that dose. Do not use double or extra doses. What Wootan interact with this medicine? -other acne medicines -salicylic acid or sulfur containing products -topical antibiotics like clindamycin or erythromycin This list Hannum not describe all possible interactions. Give your health care provider a list of all the medicines, herbs, non-prescription drugs, or dietary supplements you use. Also tell them if you smoke, drink alcohol, or use illegal  drugs. Some items Niesen interact with your medicine. What should I watch for while using this medicine? Your acne Larivee get worse at first, and then should start to get better. It Amodei take 2 to 12 weeks before you see the full effect. Do not use products that Wien dry the skin like medicated cosmetics, products that contain alcohol, astringents, spices, limes, or abrasive soaps or cleaners. Do not use other acne or skin treatments on the same area that you use this medicine unless your doctor or health care professional tells you to. If you use these together they can cause severe skin irritation. This medicine can make you more sensitive to the sun. Keep out of the sun. If you cannot avoid being in the sun, wear protective clothing and use sunscreen. Do not use sun lamps or tanning beds/booths. This medicine Brue bleach hair or colored fabrics. Avoid getting the medicine on your clothes. What side effects Klus I notice from receiving this medicine? Side effects that you should report to your doctor or health care professional as soon as possible: -allergic reactions like skin rash, itching or hives, swelling of the face, lips, or tongue -severe burning, redness, crusting, or swelling of the treated areas Side effects that usually do not require medical attention (report to your doctor or health care professional if they continue or are bothersome): -increased sensitivity to the sun -inflamed, stinging, and irritated skin -skin that peels after a few days of use This list Canizalez not describe all possible side effects. Call your doctor for medical advice about side  effects. You Hegeman report side effects to FDA at 1-800-FDA-1088. Where should I keep my medicine? Keep out of the reach of children. Store at room temperature between 15 and 30 degrees C (59 and 86 degrees F). Protect from light. Keep container tightly closed. Throw away any unused medication after the expiration date. NOTE: This sheet is a summary.  It Shoemaker not cover all possible information. If you have questions about this medicine, talk to your doctor, pharmacist, or health care provider.    2016, Elsevier/Gold Standard. (2007-06-09 17:41:57)

## 2016-01-01 NOTE — Progress Notes (Signed)
01/01/2016 9:09 AM   Brandon Benjamin [birth year] [id number]  Referring provider: Letta Median, MD [place], [place] [address]  Chief Complaint  Patient presents with  . Testicle Pain    HPI: Patient is a 70 year old Caucasian male who presents today complaining of testicular pain.  He is also s/p HoLEP on 07/11/2015.  Testicular pain Patient states that he has had an area in his scrotum for the last 3 weeks.  He states it has drained.  He denies any fevers, chills, nausea or vomiting.  The area has become smaller since his discovery.  Patient is a diabetic.  Blood sugars are under control.     BPH WITH LUTS Currently on finasteride and flomax with significant refractory symptoms  Prior to surgery, he had mixed obstructive and irritative voiding symptoms including weak stream, history of incomplete bladder emptying with elevated PVRs, stopping starting of his urinary stream along with urinary frequency , urgency, and rare urge incontinence. He also has nocturia 3-4.  Rectal exam on 05/18/15 with enlarged 60 cc gland, no nodules. PSA 2.8 on 11/18/14 No UTIs or bladder stones TRUS volume today 112 cc gland Cystoscopy with trilobar coaptation, 6 cm or prosthetic length, elevated bladder neck, and subtle intravesical protrusion of an enlarged prostate gland  S/p HoLEP 07/11/15, 61 grams resected pathology benign PVR today 50, IPSS 1/0.  Very happy with result.  He has stopped finasteride after surgery but continues on Flomax.   No dysuria or gross hematuria.  Excellent stream.        PMH: Past Medical History:  Diagnosis Date  . Anemia   . Balanitis   . BPH (benign prostatic hyperplasia)   . Chronic kidney disease   . Diabetes ([place])   . Erectile dysfunction   . GERD (gastroesophageal reflux disease)   . HLD (hyperlipidemia)   . HTN (hypertension)   . Over weight   . Priapism   . Scrotal cyst   . Urinary urgency     Surgical History: Past  Surgical History:  Procedure Laterality Date  . HOLEP-LASER ENUCLEATION OF THE PROSTATE WITH MORCELLATION N/A 07/11/2015   Procedure: HOLEP-LASER ENUCLEATION OF THE PROSTATE WITH MORCELLATION;  Surgeon: Hollice Espy, MD;  Location: [facility] ORS;  Service: Urology;  Laterality: N/A;    Home Medications:    Medication List       Accurate as of 01/01/16  9:09 AM. Always use your most recent med list.          amLODipine 5 MG tablet Commonly known as:  NORVASC Take 10 mg by mouth daily.   CENTRAVITES 50 PLUS Tabs Take 1 tablet by mouth every morning.   cetirizine 10 MG tablet Commonly known as:  ZYRTEC Take 10 mg by mouth daily.   chlorhexidine 4 % external liquid Commonly known as:  HIBICLENS Apply topically daily as needed.   citalopram 10 MG tablet Commonly known as:  CELEXA Take 10 mg by mouth daily.   docusate sodium 100 MG capsule Commonly known as:  COLACE Take 1 capsule (100 mg total) by mouth 2 (two) times daily.   fluticasone 50 MCG/ACT nasal spray Commonly known as:  FLONASE Place 2 sprays into both nostrils daily.   hydrochlorothiazide 25 MG tablet Commonly known as:  HYDRODIURIL Take 12.5 mg by mouth daily.   lisinopril 40 MG tablet Commonly known as:  PRINIVIL,ZESTRIL Take 40 mg by mouth daily.   lovastatin 20 MG tablet Commonly known as:  MEVACOR Take 20 mg by mouth at bedtime.   metFORMIN 500 MG tablet Commonly known as:  GLUCOPHAGE Take 500 mg by mouth every evening.   metoprolol 100 MG tablet Commonly known as:  LOPRESSOR Take 100 mg by mouth 2 (two) times daily.   nystatin cream Commonly known as:  MYCOSTATIN Apply 1 application topically as needed.   ranitidine 150 MG tablet Commonly known as:  ZANTAC Take 150 mg by mouth 2 (two) times daily.       Allergies: No Known Allergies  Family History: Family History  Problem Relation Age of Onset  . Cancer Mother     blood stream  . High blood pressure Mother   . Prostate cancer  Neg Hx   . Bladder Cancer Neg Hx   . Kidney disease Neg Hx     Social History:  reports that he quit smoking about 20 years ago. His smoking use included Cigarettes. He has never used smokeless tobacco. He reports that he does not drink alcohol or use drugs.  ROS: UROLOGY Frequent Urination?: No Hard to postpone urination?: No Burning/pain with urination?: No Get up at night to urinate?: No Leakage of urine?: No Urine stream starts and stops?: No Trouble starting stream?: No Do you have to strain to urinate?: No Blood in urine?: No Urinary tract infection?: No Sexually transmitted disease?: No Injury to kidneys or bladder?: No Painful intercourse?: No Weak stream?: No Erection problems?: No Penile pain?: No  Gastrointestinal Nausea?: No Vomiting?: No Indigestion/heartburn?: No Diarrhea?: No Constipation?: No  Constitutional Fever: No Night sweats?: No Weight loss?: No Fatigue?: No  Skin Skin rash/lesions?: No Itching?: No  Eyes Blurred vision?: No Double vision?: No  Ears/Nose/Throat Sore throat?: No Sinus problems?: No  Hematologic/Lymphatic Swollen glands?: No Easy bruising?: No  Cardiovascular Leg swelling?: No Chest pain?: No  Respiratory Cough?: No Shortness of breath?: No  Endocrine Excessive thirst?: No  Musculoskeletal Back pain?: No Joint pain?: No  Neurological Headaches?: No Dizziness?: No  Psychologic Depression?: No Anxiety?: No  Physical Exam: BP (!) 167/93   Pulse (!) 56   Ht 5\' 9"  (1.753 m)   Wt 218 lb 1.6 oz (98.9 kg)   BMI 32.21 kg/m   Constitutional: Well nourished. Alert and oriented, No acute distress. HEENT:  AT, moist mucus membranes. Trachea midline, no masses. Cardiovascular: No clubbing, cyanosis, or edema. Respiratory: Normal respiratory effort, no increased work of breathing. GI: Abdomen is soft, non tender, non distended, no abdominal masses. Liver and spleen not palpable.  No hernias  appreciated.  Stool sample for occult testing is not indicated.   GU: No CVA tenderness.  No bladder fullness or masses.  Patient with uncircumcised phallus. Foreskin easily retracted  Urethral meatus is patent.  No penile discharge. No penile lesions or rashes. Scrotum with a "BB" sized occluded sebaceous cyst.  No fluctuance, erythema or crepitus noted.  Testicles are located scrotally bilaterally. No masses are appreciated in the testicles. Left and right epididymis are normal. Rectal: Deferred.   Skin: No rashes, bruises or suspicious lesions. Lymph: No cervical or inguinal adenopathy. Neurologic: Grossly intact, no focal deficits, moving all 4 extremities. Psychiatric: Normal mood and affect.  Laboratory Data: Lab Results  Component Value Date   WBC 6.8 07/03/2015   HGB 16.3 07/11/2015   HCT 48.0 07/11/2015   MCV 82.5 07/03/2015   PLT 179 07/03/2015    Lab Results  Component Value Date   CREATININE 1.16 07/03/2015     Urinalysis Unremarkable.  See EPIC.  Pertinent Imaging: Results for CHLOE, MAGGIORE (MRN [id number]) as of 01/01/2016 09:13  Ref. Range 01/01/2016 08:50  Scan Result Unknown 97    Assessment & Plan:    1. Occluded sebaceous cyst  - advised patient to shower with Hibiclens  - advised patient to use a small amount of benzol peroxide to the area twice daily  - reviewed symptoms that require urgent    2. BPH with LUTS  - Urinalysis, Complete  - BLADDER SCAN AMB NON-IMAGING  - RTC in June 2018 for PSA, exam, PVR and IPSS   Return for keep appointment with Dr. Erlene Quan on 08/23/2016.  These notes generated with voice recognition software. I apologize for typographical errors.  Zara Council, [facility] [address], [place],  [address] [phone]

## 2016-01-09 NOTE — Telephone Encounter (Signed)
Error

## 2016-03-13 ENCOUNTER — Ambulatory Visit: Payer: Medicare Other | Admitting: Podiatry

## 2016-04-03 ENCOUNTER — Ambulatory Visit: Payer: Medicare Other | Admitting: Podiatry

## 2016-04-08 ENCOUNTER — Encounter: Payer: Self-pay | Admitting: Podiatry

## 2016-04-08 ENCOUNTER — Ambulatory Visit (INDEPENDENT_AMBULATORY_CARE_PROVIDER_SITE_OTHER): Payer: Medicare Other | Admitting: Podiatry

## 2016-04-08 DIAGNOSIS — M79676 Pain in unspecified toe(s): Secondary | ICD-10-CM

## 2016-04-08 DIAGNOSIS — B351 Tinea unguium: Secondary | ICD-10-CM

## 2016-04-09 NOTE — Progress Notes (Signed)
He presents today chief complaint of painful elongated toenails.  Objective: Toenails are long thick yellow dystrophic with mycotic pulses are palpable no open lesions or wounds are noted.  Assessment: Pain limb secondary to onychomycosis.  Plan: Debridement of toenails 1 through 5 bilateral.

## 2016-07-15 ENCOUNTER — Ambulatory Visit (INDEPENDENT_AMBULATORY_CARE_PROVIDER_SITE_OTHER): Payer: Medicare Other | Admitting: Podiatry

## 2016-07-15 ENCOUNTER — Encounter: Payer: Self-pay | Admitting: Podiatry

## 2016-07-15 DIAGNOSIS — B351 Tinea unguium: Secondary | ICD-10-CM

## 2016-07-15 DIAGNOSIS — E119 Type 2 diabetes mellitus without complications: Secondary | ICD-10-CM

## 2016-07-15 DIAGNOSIS — M79676 Pain in unspecified toe(s): Secondary | ICD-10-CM | POA: Diagnosis not present

## 2016-07-15 NOTE — Progress Notes (Signed)
Complaint:  Visit Type: Patient returns to my office for continued preventative foot care services. Complaint: Patient states" my nails have grown long and thick and become painful to walk and wear shoes" Patient has been diagnosed with DM with no foot complications. The patient presents for preventative foot care services.   Podiatric Exam: Vascular: dorsalis pedis and posterior tibial pulses are palpable bilateral. Capillary return is immediate. Temperature gradient is WNL. Skin turgor WNL  Sensorium: Normal Semmes Weinstein monofilament test. Normal tactile sensation bilaterally. Nail Exam: Pt has thick disfigured discolored nails with subungual debris noted bilateral entire nail hallux through fifth toenails Ulcer Exam: There is no evidence of ulcer or pre-ulcerative changes or infection. Orthopedic Exam: Muscle tone and strength are WNL. No limitations in general ROM. No crepitus or effusions noted. Foot type and digits show no abnormalities. Bony prominences are unremarkable. Skin: No Porokeratosis. No infection or ulcers  Diagnosis:  Onychomycosis, , Pain in right toe, pain in left toes  Treatment & Plan Procedures and Treatment: Consent by patient was obtained for treatment procedures. The patient understood the discussion of treatment and procedures well. All questions were answered thoroughly reviewed. Debridement of mycotic and hypertrophic toenails, 1 through 5 bilateral and clearing of subungual debris. No ulceration, no infection noted.  Return Visit-Office Procedure: Patient instructed to return to the office for a follow up visit 3 months for continued evaluation and treatment.    Makhai Fulco DPM 

## 2016-08-23 ENCOUNTER — Ambulatory Visit: Payer: Medicare Other | Admitting: Urology

## 2016-08-30 ENCOUNTER — Encounter: Payer: Self-pay | Admitting: Urology

## 2016-08-30 ENCOUNTER — Ambulatory Visit (INDEPENDENT_AMBULATORY_CARE_PROVIDER_SITE_OTHER): Payer: Medicare Other | Admitting: Urology

## 2016-08-30 VITALS — BP 152/81 | HR 63 | Ht 69.0 in | Wt 219.0 lb

## 2016-08-30 DIAGNOSIS — N400 Enlarged prostate without lower urinary tract symptoms: Secondary | ICD-10-CM

## 2016-08-30 DIAGNOSIS — N402 Nodular prostate without lower urinary tract symptoms: Secondary | ICD-10-CM

## 2016-08-30 DIAGNOSIS — R300 Dysuria: Secondary | ICD-10-CM

## 2016-08-30 LAB — URINALYSIS, COMPLETE
BILIRUBIN UA: NEGATIVE
KETONES UA: NEGATIVE
LEUKOCYTES UA: NEGATIVE
NITRITE UA: NEGATIVE
Protein, UA: NEGATIVE
RBC UA: NEGATIVE
SPEC GRAV UA: 1.02 (ref 1.005–1.030)
Urobilinogen, Ur: 0.2 mg/dL (ref 0.2–1.0)
pH, UA: 7 (ref 5.0–7.5)

## 2016-08-30 LAB — MICROSCOPIC EXAMINATION
Bacteria, UA: NONE SEEN
RBC, UA: NONE SEEN /hpf (ref 0–?)

## 2016-08-30 NOTE — Progress Notes (Signed)
08/30/2016 10:36 AM   Brandon Benjamin [birth year] [id number]  Referring provider: Letta Median, MD [address] [place], [place] [address]  Chief Complaint  Patient presents with  . Benign Prostatic Hypertrophy    1 year follow up     HPI: 71 year old Caucasian male who with history of BPH s/p HoLEP on 07/11/2015.  He returns today for routine follow-up.  BPH WITH LUTS Prior to surgery, he had mixed obstructive and irritative voiding symptoms On maximal medical therapy including finasteride and Flomax including weak stream, history of incomplete bladder emptying with elevated PVRs, stopping starting of his urinary stream along with urinary frequency, urgency, and rare urge incontinence. He also has nocturia 3-4.   TRUS volume 112 cc.    S/p HoLEP 07/11/15, 61 grams resected pathology benign  Since surgery, he had an excellent urinary stream with very few voiding symptoms. He is no longer on any BPH medications.  IPSS 2/ mostly satisfied.  Previous PVRs minimal.  His only complaint today is occasional terminal dysuria which is long-standing and not bothersome. He denies any gross hematuria or UTIs.        IPSS    Row Name 08/30/16 1000         International Prostate Symptom Score   How often have you had the sensation of not emptying your bladder? Not at All     How often have you had to urinate less than every two hours? Not at All     How often have you found you stopped and started again several times when you urinated? Not at All     How often have you found it difficult to postpone urination? Not at All     How often have you had a weak urinary stream? Not at All     How often have you had to strain to start urination? Not at All     How many times did you typically get up at night to urinate? 2 Times     Total IPSS Score 2       Quality of Life due to urinary symptoms   If you were to spend the rest of your life with your urinary condition just  the way it is now how would you feel about that? Mostly Satisfied        Score:  1-7 Mild 8-19 Moderate 20-35 Severe    PMH: Past Medical History:  Diagnosis Date  . Anemia   . Balanitis   . BPH (benign prostatic hyperplasia)   . Chronic kidney disease   . Diabetes ([place])   . Erectile dysfunction   . GERD (gastroesophageal reflux disease)   . HLD (hyperlipidemia)   . HTN (hypertension)   . Over weight   . Priapism   . Scrotal cyst   . Urinary urgency     Surgical History: Past Surgical History:  Procedure Laterality Date  . HOLEP-LASER ENUCLEATION OF THE PROSTATE WITH MORCELLATION N/A 07/11/2015   Procedure: HOLEP-LASER ENUCLEATION OF THE PROSTATE WITH MORCELLATION;  Surgeon: Hollice Espy, MD;  Location: [facility] ORS;  Service: Urology;  Laterality: N/A;    Home Medications:  Allergies as of 08/30/2016   No Known Allergies     Medication List       Accurate as of 08/30/16 10:36 AM. Always use your most recent med list.          amLODipine 5 MG tablet Commonly known as:  NORVASC Take 10 mg  by mouth daily.   CENTRAVITES 50 PLUS Tabs Take 1 tablet by mouth every morning.   cetirizine 10 MG tablet Commonly known as:  ZYRTEC Take 10 mg by mouth daily.   chlorhexidine 4 % external liquid Commonly known as:  HIBICLENS Apply topically daily as needed.   citalopram 10 MG tablet Commonly known as:  CELEXA Take 10 mg by mouth daily.   docusate sodium 100 MG capsule Commonly known as:  COLACE Take 1 capsule (100 mg total) by mouth 2 (two) times daily.   fluticasone 50 MCG/ACT nasal spray Commonly known as:  FLONASE Place 2 sprays into both nostrils daily.   hydrochlorothiazide 25 MG tablet Commonly known as:  HYDRODIURIL Take 12.5 mg by mouth daily.   lisinopril 40 MG tablet Commonly known as:  PRINIVIL,ZESTRIL Take 40 mg by mouth daily.   lovastatin 20 MG tablet Commonly known as:  MEVACOR Take 20 mg by mouth at bedtime.   metFORMIN 500 MG  tablet Commonly known as:  GLUCOPHAGE Take 500 mg by mouth every evening.   metoprolol tartrate 100 MG tablet Commonly known as:  LOPRESSOR Take 100 mg by mouth 2 (two) times daily.   nystatin cream Commonly known as:  MYCOSTATIN Apply 1 application topically as needed.   ranitidine 150 MG tablet Commonly known as:  ZANTAC Take 150 mg by mouth 2 (two) times daily.       Allergies: No Known Allergies  Family History: Family History  Problem Relation Age of Onset  . Cancer Mother        blood stream  . High blood pressure Mother   . Prostate cancer Neg Hx   . Bladder Cancer Neg Hx   . Kidney disease Neg Hx     Social History:  reports that he quit smoking about 21 years ago. His smoking use included Cigarettes. He has never used smokeless tobacco. He reports that he does not drink alcohol or use drugs.  ROS: UROLOGY Frequent Urination?: No Hard to postpone urination?: No Burning/pain with urination?: Yes Get up at night to urinate?: Yes Leakage of urine?: No Urine stream starts and stops?: Yes Trouble starting stream?: No Do you have to strain to urinate?: No Blood in urine?: No Urinary tract infection?: No Sexually transmitted disease?: No Injury to kidneys or bladder?: No Painful intercourse?: No Weak stream?: No Erection problems?: No Penile pain?: Yes  Gastrointestinal Nausea?: No Vomiting?: No Indigestion/heartburn?: No Diarrhea?: No Constipation?: No  Constitutional Fever: No Night sweats?: No Weight loss?: No Fatigue?: No  Skin Skin rash/lesions?: No Itching?: Yes  Eyes Blurred vision?: No Double vision?: No  Ears/Nose/Throat Sore throat?: No Sinus problems?: Yes  Hematologic/Lymphatic Swollen glands?: No Easy bruising?: No  Cardiovascular Leg swelling?: No Chest pain?: No  Respiratory Cough?: No Shortness of breath?: No  Endocrine Excessive thirst?: No  Musculoskeletal Back pain?: No Joint pain?:  No  Neurological Headaches?: No Dizziness?: No  Psychologic Depression?: No Anxiety?: No  Physical Exam: BP (!) 152/81   Pulse 63   Ht 5\' 9"  (1.753 m)   Wt 219 lb (99.3 kg)   BMI 32.34 kg/m   Constitutional:  Alert and oriented, No acute distress. HEENT: [place] AT, moist mucus membranes.  Trachea midline, no masses. Cardiovascular: No clubbing, cyanosis, or edema. Respiratory: Normal respiratory effort, no increased work of breathing. GI: Abdomen is soft, nontender, nondistended, no abdominal masses GU: Circumcised phallus. Bilateral descended testicles. Rectal: Normal sphincter tone. Enlarged 50+ cc prostate with large 1 cm right  apical extremely rubbery nodule consistent with BPH nodule. No masses were firm nodules. Skin: No rashes, bruises or suspicious lesions. Neurologic: Grossly intact, no focal deficits, moving all 4 extremities. Psychiatric: Normal mood and affect.  Laboratory Data: Lab Results  Component Value Date   WBC 6.8 07/03/2015   HGB 16.3 07/11/2015   HCT 48.0 07/11/2015   MCV 82.5 07/03/2015   PLT 179 07/03/2015    Lab Results  Component Value Date   CREATININE 1.16 07/03/2015     Urinalysis UA reviewed, positive only for 3+ glucose  Pertinent Imaging: n/a  Assessment & Plan:    1. Benign prostatic hyperplasia without lower urinary tract symptoms Excellent surgical outcome status post holmium laser enucleation of the prostate Currently on any medications for symptoms UA to rule out infection, advised increasing water intake  Refer PSA today, previously 2.8 - PSA - Urinalysis, Complete  2. Dysuria UA to rule out infection- appears benign Excellent urinary stream, stricture unlikely  3. Prostate nodule Rubbery prostate nodule most consistent with BPH nodule, we will correlate with PSA    Return if symptoms worsen or fail to improve.  Hollice Espy, MD  [facility] [address] [place],   [address] [phone]

## 2016-08-31 LAB — PSA: PROSTATE SPECIFIC AG, SERUM: 3.1 ng/mL (ref 0.0–4.0)

## 2016-09-02 ENCOUNTER — Telehealth: Payer: Self-pay

## 2016-09-02 NOTE — Telephone Encounter (Signed)
-----   Message from Hollice Espy, MD sent at 08/31/2016  2:31 PM EDT ----- PSA stale (now off finasteride).  Great news.  Please f/u as needed.  Hollice Espy, MD

## 2016-09-02 NOTE — Telephone Encounter (Signed)
Called patient. Gave lab results. Patient verbalized understanding.  

## 2016-10-14 ENCOUNTER — Ambulatory Visit (INDEPENDENT_AMBULATORY_CARE_PROVIDER_SITE_OTHER): Payer: Medicare Other | Admitting: Podiatry

## 2016-10-14 DIAGNOSIS — E119 Type 2 diabetes mellitus without complications: Secondary | ICD-10-CM

## 2016-10-14 DIAGNOSIS — B351 Tinea unguium: Secondary | ICD-10-CM | POA: Diagnosis not present

## 2016-10-14 DIAGNOSIS — M79676 Pain in unspecified toe(s): Secondary | ICD-10-CM | POA: Diagnosis not present

## 2016-10-14 NOTE — Progress Notes (Signed)
Complaint:  Visit Type: Patient returns to my office for continued preventative foot care services. Complaint: Patient states" my nails have grown long and thick and become painful to walk and wear shoes" Patient has been diagnosed with DM with no foot complications. The patient presents for preventative foot care services.   Podiatric Exam: Vascular: dorsalis pedis and posterior tibial pulses are palpable bilateral. Capillary return is immediate. Temperature gradient is WNL. Skin turgor WNL  Sensorium: Normal Semmes Weinstein monofilament test. Normal tactile sensation bilaterally. Nail Exam: Pt has thick disfigured discolored nails with subungual debris noted bilateral entire nail hallux through fifth toenails Ulcer Exam: There is no evidence of ulcer or pre-ulcerative changes or infection. Orthopedic Exam: Muscle tone and strength are WNL. No limitations in general ROM. No crepitus or effusions noted. Foot type and digits show no abnormalities. Bony prominences are unremarkable. Skin: No Porokeratosis. No infection or ulcers  Diagnosis:  Onychomycosis, , Pain in right toe, pain in left toes  Treatment & Plan Procedures and Treatment: Consent by patient was obtained for treatment procedures. The patient understood the discussion of treatment and procedures well. All questions were answered thoroughly reviewed. Debridement of mycotic and hypertrophic toenails, 1 through 5 bilateral and clearing of subungual debris. No ulceration, no infection noted.  Return Visit-Office Procedure: Patient instructed to return to the office for a follow up visit 3 months for continued evaluation and treatment.    Marilena Trevathan DPM 

## 2016-10-25 ENCOUNTER — Other Ambulatory Visit: Payer: Self-pay | Admitting: Family Medicine

## 2016-10-25 DIAGNOSIS — R1011 Right upper quadrant pain: Secondary | ICD-10-CM

## 2016-10-30 ENCOUNTER — Ambulatory Visit
Admission: RE | Admit: 2016-10-30 | Discharge: 2016-10-30 | Disposition: A | Discharge status: Home / Self Care | Payer: Medicare Other | Source: Ambulatory Visit | Attending: Family Medicine | Admitting: Family Medicine

## 2016-10-30 DIAGNOSIS — N281 Cyst of kidney, acquired: Secondary | ICD-10-CM | POA: Insufficient documentation

## 2016-10-30 DIAGNOSIS — K8020 Calculus of gallbladder without cholecystitis without obstruction: Secondary | ICD-10-CM | POA: Insufficient documentation

## 2016-10-30 DIAGNOSIS — K760 Fatty (change of) liver, not elsewhere classified: Secondary | ICD-10-CM | POA: Diagnosis not present

## 2016-10-30 DIAGNOSIS — R1011 Right upper quadrant pain: Secondary | ICD-10-CM | POA: Diagnosis present

## 2016-10-30 DIAGNOSIS — K7689 Other specified diseases of liver: Secondary | ICD-10-CM | POA: Insufficient documentation

## 2016-11-06 ENCOUNTER — Encounter: Payer: Self-pay | Admitting: *Deleted

## 2016-11-11 ENCOUNTER — Ambulatory Visit: Payer: Medicare Other | Admitting: General Surgery

## 2016-11-15 DIAGNOSIS — I10 Essential (primary) hypertension: Secondary | ICD-10-CM | POA: Insufficient documentation

## 2016-11-15 DIAGNOSIS — N529 Male erectile dysfunction, unspecified: Secondary | ICD-10-CM | POA: Insufficient documentation

## 2016-11-15 DIAGNOSIS — E785 Hyperlipidemia, unspecified: Secondary | ICD-10-CM | POA: Insufficient documentation

## 2016-11-15 DIAGNOSIS — R3915 Urgency of urination: Secondary | ICD-10-CM | POA: Insufficient documentation

## 2016-11-15 DIAGNOSIS — N4830 Priapism, unspecified: Secondary | ICD-10-CM | POA: Insufficient documentation

## 2016-11-15 DIAGNOSIS — N189 Chronic kidney disease, unspecified: Secondary | ICD-10-CM | POA: Insufficient documentation

## 2016-11-15 DIAGNOSIS — D649 Anemia, unspecified: Secondary | ICD-10-CM | POA: Insufficient documentation

## 2016-11-15 DIAGNOSIS — N400 Enlarged prostate without lower urinary tract symptoms: Secondary | ICD-10-CM | POA: Insufficient documentation

## 2016-11-15 DIAGNOSIS — L729 Follicular cyst of the skin and subcutaneous tissue, unspecified: Secondary | ICD-10-CM | POA: Insufficient documentation

## 2016-11-15 DIAGNOSIS — Z7189 Other specified counseling: Secondary | ICD-10-CM | POA: Insufficient documentation

## 2016-11-20 ENCOUNTER — Other Ambulatory Visit
Admission: RE | Admit: 2016-11-20 | Discharge: 2016-11-20 | Disposition: A | Discharge status: Home / Self Care | Payer: Medicare Other | Source: Ambulatory Visit | Attending: Surgery | Admitting: Surgery

## 2016-11-20 ENCOUNTER — Encounter (INDEPENDENT_AMBULATORY_CARE_PROVIDER_SITE_OTHER): Payer: Self-pay

## 2016-11-20 ENCOUNTER — Encounter: Payer: Self-pay | Admitting: Surgery

## 2016-11-20 ENCOUNTER — Ambulatory Visit (INDEPENDENT_AMBULATORY_CARE_PROVIDER_SITE_OTHER): Payer: Medicare Other | Admitting: Surgery

## 2016-11-20 DIAGNOSIS — K8020 Calculus of gallbladder without cholecystitis without obstruction: Secondary | ICD-10-CM | POA: Insufficient documentation

## 2016-11-20 LAB — COMPREHENSIVE METABOLIC PANEL
ALT: 19 U/L (ref 17–63)
ANION GAP: 11 (ref 5–15)
AST: 20 U/L (ref 15–41)
Albumin: 4.2 g/dL (ref 3.5–5.0)
Alkaline Phosphatase: 59 U/L (ref 38–126)
BUN: 20 mg/dL (ref 6–20)
CALCIUM: 10.3 mg/dL (ref 8.9–10.3)
CHLORIDE: 102 mmol/L (ref 101–111)
CO2: 26 mmol/L (ref 22–32)
CREATININE: 1.24 mg/dL (ref 0.61–1.24)
GFR, EST NON AFRICAN AMERICAN: 58 mL/min — AB (ref >60)
GLUCOSE: 96 mg/dL (ref 65–99)
POTASSIUM: 3.1 mmol/L — AB (ref 3.5–5.1)
Sodium: 139 mmol/L (ref 135–145)
Total Bilirubin: 0.5 mg/dL (ref 0.3–1.2)
Total Protein: 7.5 g/dL (ref 6.5–8.1)

## 2016-11-20 LAB — CBC WITH DIFFERENTIAL/PLATELET
Basophils Absolute: 0.1 10*3/uL (ref 0–0.1)
Basophils Relative: 1 %
EOS PCT: 3 %
Eosinophils Absolute: 0.2 10*3/uL (ref 0–0.7)
HCT: 34.8 % — ABNORMAL LOW (ref 40.0–52.0)
Hemoglobin: 10.9 g/dL — ABNORMAL LOW (ref 13.0–18.0)
LYMPHS PCT: 21 %
Lymphs Abs: 1.1 10*3/uL (ref 1.0–3.6)
MCH: 22.4 pg — ABNORMAL LOW (ref 26.0–34.0)
MCHC: 31.4 g/dL — AB (ref 32.0–36.0)
MCV: 71.3 fL — AB (ref 80.0–100.0)
MONO ABS: 0.5 10*3/uL (ref 0.2–1.0)
MONOS PCT: 9 %
Neutro Abs: 3.4 10*3/uL (ref 1.4–6.5)
Neutrophils Relative %: 66 %
PLATELETS: 221 10*3/uL (ref 150–440)
RBC: 4.88 MIL/uL (ref 4.40–5.90)
RDW: 18.4 % — AB (ref 11.5–14.5)
WBC: 5.1 10*3/uL (ref 3.8–10.6)

## 2016-11-20 NOTE — Patient Instructions (Signed)
MRI has been scheduled for 10/23/16 at 1400. Arrival to [facility] at 1330. Starting at 10am Nothing by mouth.  We have ordered some labs to be drawn today. Please proceed to the [facility] to have these tests completed prior to leaving today. You will check in at the registration desk in the medical mall. Please see walking directions below if needed.  We will call you with the results and next step in plan of care as soon as results are received.   Directions to Medical Mall: When leaving our office, go right. Go all of the way down to the very end of the hallway. You will have a purple wall in front of you. You will now have a tunnel to the hospital on your left hand side. Go through this tunnel and the elevators will be on your left. Go down to the 1st floor and take a slight left. The very first desk on the right hand side is the registration desk.

## 2016-11-21 NOTE — Progress Notes (Signed)
Surgical Consultation  11/21/2016  Brandon Benjamin is an 71 y.o. male.   Chief Complaint  Patient presents with  . New Patient (Initial Visit)     HPI: seen in coConsultation at the request of Dr. Rebeca Alert. He describes he started having some intermittent right upper quadrant pain the pain is sharp, moderate in intensity and worsening with meals. Apparently worsened over the last month or so. No fevers no chills no evidence of biliary obstruction or cholangitis. Further workup included a ultrasound that I have personally reviewed showing evidence of cholelithiasis with an enlarged common bile duct. LFTs were normal as well as creatinine. He is able to perform more than 4 Mets of activity without any shortness of breath or chest pain. She had a recent transurethral prostatectomy by Dr. Erlene Quan a  couple months ago  Past Medical History:  Diagnosis Date  . Anemia   . Balanitis   . Balanitis   . BPH (benign prostatic hyperplasia)   . Chronic kidney disease   . Diabetes ([place])   . Erectile dysfunction   . GERD (gastroesophageal reflux disease)   . HLD (hyperlipidemia)   . HTN (hypertension)   . Over weight   . Priapism   . Scrotal cyst   . Urinary urgency     Past Surgical History:  Procedure Laterality Date  . HOLEP-LASER ENUCLEATION OF THE PROSTATE WITH MORCELLATION N/A 07/11/2015   Procedure: HOLEP-LASER ENUCLEATION OF THE PROSTATE WITH MORCELLATION;  Surgeon: Hollice Espy, MD;  Location: [facility] ORS;  Service: Urology;  Laterality: N/A;    Family History  Problem Relation Age of Onset  . Cancer Mother        blood stream  . High blood pressure Mother   . Prostate cancer Neg Hx   . Bladder Cancer Neg Hx   . Kidney disease Neg Hx     Social History:  reports that he quit smoking about 21 years ago. His smoking use included Cigarettes. He has never used smokeless tobacco. He reports that he does not drink alcohol or use drugs.  Allergies: No Known Allergies  Medications  reviewed.     ROS Full ROS performed and is otherwise negative other than what is stated in the HPI    BP (!) 155/95   Pulse 68   Temp 98.5 F (36.9 C) (Oral)   Ht 5\' 9"  (1.753 m)   Wt 96.4 kg (212 lb 9.6 oz)   BMI 31.40 kg/m   Physical Exam  Constitutional: He is oriented to person, place, and time and well-developed, well-nourished, and in no distress. No distress.  Eyes: Right eye exhibits no discharge. Left eye exhibits no discharge. No scleral icterus.  Neck: No JVD present. No tracheal deviation present. No thyromegaly present.  Cardiovascular: Normal rate and regular rhythm.   Pulmonary/Chest: Effort normal and breath sounds normal. No stridor. No respiratory distress. He has no wheezes.  Abdominal: Soft. He exhibits no distension. There is no tenderness. There is no rebound and no guarding.  Musculoskeletal: Normal range of motion.  Neurological: He is alert and oriented to person, place, and time. Gait normal. GCS score is 15.  Skin: Skin is warm and dry. He is not diaphoretic.  Psychiatric: Mood, memory, affect and judgment normal.  Nursing note and vitals reviewed.    Assessment/Plan: 71 year old male with findings consistent with symptomatic cholelithiasis and an increase in the common bile duct raising the possibility of choledocholithiasis. We will obtain an MRI and MRCP to rule out any  biliary obstructive process. I will see him back in a couple weeks and he will likely benefit from laparoscopic cholecystectomy. I will obviously first will like to interrogate the common bile duct for proceeding with any surgical intervention. Discussed with the patient in detail. A copy of this report was sent to the referring provider. I will also try the LFTs and a CBC.  Caroleen Hamman, MD [facility] General Surgeon

## 2016-11-23 ENCOUNTER — Other Ambulatory Visit: Payer: Self-pay | Admitting: Surgery

## 2016-11-23 ENCOUNTER — Ambulatory Visit
Admission: RE | Admit: 2016-11-23 | Discharge: 2016-11-23 | Disposition: A | Discharge status: Home / Self Care | Payer: Medicare Other | Source: Ambulatory Visit | Attending: Surgery | Admitting: Surgery

## 2016-11-23 DIAGNOSIS — K7689 Other specified diseases of liver: Secondary | ICD-10-CM | POA: Insufficient documentation

## 2016-11-23 DIAGNOSIS — N281 Cyst of kidney, acquired: Secondary | ICD-10-CM | POA: Diagnosis not present

## 2016-11-23 DIAGNOSIS — K449 Diaphragmatic hernia without obstruction or gangrene: Secondary | ICD-10-CM | POA: Insufficient documentation

## 2016-11-23 DIAGNOSIS — K838 Other specified diseases of biliary tract: Secondary | ICD-10-CM | POA: Diagnosis not present

## 2016-11-23 DIAGNOSIS — K8020 Calculus of gallbladder without cholecystitis without obstruction: Secondary | ICD-10-CM | POA: Insufficient documentation

## 2016-11-23 DIAGNOSIS — K8050 Calculus of bile duct without cholangitis or cholecystitis without obstruction: Secondary | ICD-10-CM | POA: Diagnosis not present

## 2016-11-23 MED ORDER — GADOBENATE DIMEGLUMINE 529 MG/ML IV SOLN
20.0000 mL | Freq: Once | INTRAVENOUS | Status: AC | PRN
  Administered 2016-11-23: 20 mL via INTRAVENOUS

## 2016-11-26 ENCOUNTER — Telehealth: Payer: Self-pay | Admitting: Surgery

## 2016-11-26 ENCOUNTER — Other Ambulatory Visit: Payer: Self-pay

## 2016-11-26 DIAGNOSIS — K8020 Calculus of gallbladder without cholecystitis without obstruction: Secondary | ICD-10-CM

## 2016-11-26 NOTE — Telephone Encounter (Signed)
Spoke with Dr. Dahlia Byes. He has reviewed MRCP and has recommended ERCP with Dr. Allen Norris as soon as possible.   Dr. Dorothey Baseman office notified. Spoke with [place]. She will have nurse call with ERCP date so that I Mussa notify patient.  Call made to patient's wife. Explained results of MRCP and that an ERCP would be needed. I will return phone call to patient as soon as I have a scheduled ERCP date.

## 2016-11-26 NOTE — Telephone Encounter (Signed)
Spoke with Panya from [place]. Patient has been scheduled for ERCP with Dr. Allen Norris on 11/28/16. He is to be NPO after MN prior and is to call the day prior for arrival time [phone].  Call returned to patient. Spoke with patient's wife. She was given all information above and completed a read back on the phone. She verbalized understanding and was encouraged to call back with any further questions.

## 2016-11-26 NOTE — Telephone Encounter (Signed)
Patient's wife has called and would advised that someone would be calling her with the results of the imaging that was completed on 11/23/16 ordered by Dr Dahlia Byes. Please call patient with results.

## 2016-11-28 ENCOUNTER — Ambulatory Visit: Payer: Medicare Other

## 2016-11-28 ENCOUNTER — Ambulatory Visit
Admission: RE | Admit: 2016-11-28 | Discharge: 2016-11-28 | Disposition: A | Discharge status: Home / Self Care | Payer: Medicare Other | Source: Ambulatory Visit | Attending: Gastroenterology | Admitting: Gastroenterology

## 2016-11-28 ENCOUNTER — Ambulatory Visit: Payer: Medicare Other | Admitting: Anesthesiology

## 2016-11-28 ENCOUNTER — Encounter
Admission: RE | Disposition: A | Discharge status: Home / Self Care | Payer: Self-pay | Source: Ambulatory Visit | Attending: Gastroenterology

## 2016-11-28 ENCOUNTER — Encounter: Payer: Self-pay | Admitting: Anesthesiology

## 2016-11-28 DIAGNOSIS — E1122 Type 2 diabetes mellitus with diabetic chronic kidney disease: Secondary | ICD-10-CM | POA: Insufficient documentation

## 2016-11-28 DIAGNOSIS — K8020 Calculus of gallbladder without cholecystitis without obstruction: Secondary | ICD-10-CM | POA: Diagnosis not present

## 2016-11-28 DIAGNOSIS — Z87891 Personal history of nicotine dependence: Secondary | ICD-10-CM | POA: Diagnosis not present

## 2016-11-28 DIAGNOSIS — Z79899 Other long term (current) drug therapy: Secondary | ICD-10-CM | POA: Insufficient documentation

## 2016-11-28 DIAGNOSIS — Z7984 Long term (current) use of oral hypoglycemic drugs: Secondary | ICD-10-CM | POA: Diagnosis not present

## 2016-11-28 DIAGNOSIS — K219 Gastro-esophageal reflux disease without esophagitis: Secondary | ICD-10-CM | POA: Diagnosis not present

## 2016-11-28 DIAGNOSIS — N529 Male erectile dysfunction, unspecified: Secondary | ICD-10-CM | POA: Diagnosis not present

## 2016-11-28 DIAGNOSIS — N400 Enlarged prostate without lower urinary tract symptoms: Secondary | ICD-10-CM | POA: Insufficient documentation

## 2016-11-28 DIAGNOSIS — E669 Obesity, unspecified: Secondary | ICD-10-CM | POA: Diagnosis not present

## 2016-11-28 DIAGNOSIS — N189 Chronic kidney disease, unspecified: Secondary | ICD-10-CM | POA: Diagnosis not present

## 2016-11-28 DIAGNOSIS — K8050 Calculus of bile duct without cholangitis or cholecystitis without obstruction: Secondary | ICD-10-CM | POA: Insufficient documentation

## 2016-11-28 DIAGNOSIS — Z6831 Body mass index (BMI) 31.0-31.9, adult: Secondary | ICD-10-CM | POA: Diagnosis not present

## 2016-11-28 DIAGNOSIS — E785 Hyperlipidemia, unspecified: Secondary | ICD-10-CM | POA: Diagnosis not present

## 2016-11-28 DIAGNOSIS — I129 Hypertensive chronic kidney disease with stage 1 through stage 4 chronic kidney disease, or unspecified chronic kidney disease: Secondary | ICD-10-CM | POA: Insufficient documentation

## 2016-11-28 HISTORY — PX: ERCP: SHX5425

## 2016-11-28 LAB — GLUCOSE, CAPILLARY: Glucose-Capillary: 117 mg/dL — ABNORMAL HIGH (ref 65–99)

## 2016-11-28 SURGERY — ERCP, WITH INTERVENTION IF INDICATED
Anesthesia: General

## 2016-11-28 MED ORDER — FENTANYL CITRATE (PF) 100 MCG/2ML IJ SOLN
INTRAMUSCULAR | Status: DC | PRN
  Administered 2016-11-28 (×2): 50 ug via INTRAVENOUS

## 2016-11-28 MED ORDER — SODIUM CHLORIDE 0.9 % IV SOLN
INTRAVENOUS | Status: DC
  Administered 2016-11-28: 11:00:00 via INTRAVENOUS

## 2016-11-28 MED ORDER — FENTANYL CITRATE (PF) 100 MCG/2ML IJ SOLN
INTRAMUSCULAR | Status: AC
  Filled 2016-11-28: qty 2

## 2016-11-28 MED ORDER — LIDOCAINE HCL (CARDIAC) 20 MG/ML IV SOLN
INTRAVENOUS | Status: DC | PRN
  Administered 2016-11-28: 30 mg via INTRAVENOUS

## 2016-11-28 MED ORDER — EPINEPHRINE PF 1 MG/10ML IJ SOSY
PREFILLED_SYRINGE | INTRAMUSCULAR | Status: DC | PRN
  Administered 2016-11-28: 0.2 mg via INTRAVENOUS

## 2016-11-28 MED ORDER — PROPOFOL 10 MG/ML IV BOLUS
INTRAVENOUS | Status: AC
  Filled 2016-11-28: qty 20

## 2016-11-28 MED ORDER — PROPOFOL 500 MG/50ML IV EMUL
INTRAVENOUS | Status: AC
  Filled 2016-11-28: qty 50

## 2016-11-28 MED ORDER — INDOMETHACIN 50 MG RE SUPP
RECTAL | Status: AC
  Filled 2016-11-28: qty 2

## 2016-11-28 MED ORDER — GLYCOPYRROLATE 0.2 MG/ML IJ SOLN
INTRAMUSCULAR | Status: DC | PRN
  Administered 2016-11-28: 0.1 mg via INTRAVENOUS

## 2016-11-28 MED ORDER — MIDAZOLAM HCL 2 MG/2ML IJ SOLN
INTRAMUSCULAR | Status: AC
  Filled 2016-11-28: qty 2

## 2016-11-28 MED ORDER — INDOMETHACIN 50 MG RE SUPP
100.0000 mg | Freq: Once | RECTAL | Status: AC
  Administered 2016-11-28: 100 mg via RECTAL

## 2016-11-28 MED ORDER — EPINEPHRINE PF 1 MG/10ML IJ SOSY
PREFILLED_SYRINGE | INTRAMUSCULAR | Status: AC
  Filled 2016-11-28: qty 10

## 2016-11-28 MED ORDER — PROPOFOL 500 MG/50ML IV EMUL
INTRAVENOUS | Status: DC | PRN
  Administered 2016-11-28: 120 ug/kg/min via INTRAVENOUS

## 2016-11-28 MED ORDER — MIDAZOLAM HCL 2 MG/2ML IJ SOLN
INTRAMUSCULAR | Status: DC | PRN
  Administered 2016-11-28: 2 mg via INTRAVENOUS

## 2016-11-28 NOTE — Anesthesia Post-op Follow-up Note (Signed)
Anesthesia QCDR form completed.        

## 2016-11-28 NOTE — Anesthesia Procedure Notes (Signed)
Performed by: Vaughan Sine Pre-anesthesia Checklist: Emergency Drugs available, Suction available, Patient identified, Patient being monitored and Timeout performed Patient Re-evaluated:Patient Re-evaluated prior to induction Oxygen Delivery Method: Nasal cannula Preoxygenation: Pre-oxygenation with 100% oxygen Induction Type: IV induction Airway Equipment and Method: Bite block Placement Confirmation: positive ETCO2 and CO2 detector

## 2016-11-28 NOTE — Op Note (Signed)
[facility] Gastroenterology Patient Name: Brandon Benjamin Procedure Date: 11/28/2016 11:52 AM MRN: [id number] Account #: [id number] Date of Birth: May 27, 1945 Admit Type: Outpatient Age: 71 [facility] ENDO ROOM 4 Gender: Male Note Status: Finalized Procedure:            ERCP Indications:          Bile duct stone on magnetic resonance                        cholangiopancreatography Providers:            [name] MD, MD Referring MD:         [facility] ***Why (Referring MD) Medicines:            Propofol per Anesthesia Complications:        Minor hemorrhage - stopped spontaneously Procedure:            Pre-Anesthesia Assessment:                       - Prior to the procedure, a History and Physical was                        performed, and patient medications and allergies were                        reviewed. The patient's tolerance of previous                        anesthesia was also reviewed. The risks and benefits of                        the procedure and the sedation options and risks were                        discussed with the patient. All questions were                        answered, and informed consent was obtained. Prior                        Anticoagulants: The patient has taken no previous                        anticoagulant or antiplatelet agents. ASA Grade                        Assessment: II - A patient with mild systemic disease.                        After reviewing the risks and benefits, the patient was                        deemed in satisfactory condition to undergo the                        procedure.                       After obtaining informed consent, the scope was passed  under direct vision. Throughout the procedure, the                        patient's blood pressure, pulse, and oxygen saturations                        were monitored continuously. The Endoscope was   introduced through the mouth, and used to inject                        contrast into and used to inject contrast into the bile                        duct. The ERCP was accomplished without difficulty. The                        patient tolerated the procedure well. Findings:      The scout film was normal. The esophagus was successfully intubated       under direct vision. The scope was advanced to a normal major papilla in       the descending duodenum without detailed examination of the pharynx,       larynx and associated structures, and upper GI tract. The upper GI tract       was grossly normal. The bile duct was deeply cannulated with the       short-nosed traction sphincterotome. Contrast was injected. I personally       interpreted the bile duct images. There was brisk flow of contrast       through the ducts. Image quality was excellent. Contrast extended to the       entire biliary tree. The common bile duct contained three stones mm. The       entire biliary tree was dilated. A wire was passed into the biliary       tree. A 10 mm biliary sphincterotomy was made with a traction (standard)       sphincterotome using ERBE electrocautery. There was no       post-sphincterotomy bleeding. Dilation of the common bile duct with a       12-13.5-15 mm balloon (to a maximum balloon size of 15 mm) dilator was       successful. The biliary tree was swept with a 15 mm balloon starting at       the bifurcation. Three stones were removed. No stones remained. Area was       successfully injected with 2 mL of a 1:10,000 solution of epinephrine       through the ERCP scope for hemostasis. Impression:           - The entire biliary tree was dilated.                       - Choledocholithiasis was found. Complete removal was                        accomplished by biliary sphincterotomy and balloon                        extraction.                       - A biliary sphincterotomy was performed.                        -  Common bile duct was successfully dilated.                       - The biliary tree was swept. Recommendation:       - Watch for pancreatitis, bleeding, perforation, and                        cholangitis.                       - If any vomiting blood or black stools then come to                        the ER. Procedure Code(s):    --- Professional ---                       [phone], 59, Endoscopic retrograde                        cholangiopancreatography (ERCP); with trans-endoscopic                        balloon dilation of biliary/pancreatic duct(s) or of                        ampulla (sphincteroplasty), including sphincterotomy,                        when performed, each duct                       43264, Endoscopic retrograde cholangiopancreatography                        (ERCP); with removal of calculi/debris from                        biliary/pancreatic duct(s)                       25852, Endoscopic catheterization of the biliary ductal                        system, radiological supervision and interpretation                       [phone], Unlisted procedure, biliary tract Diagnosis Code(s):    --- Professional ---                       K83.8, Other specified diseases of biliary tract                       K80.50, Calculus of bile duct without cholangitis or                        cholecystitis without obstruction CPT copyright 2016 American Medical Association. All rights reserved. The codes documented in this report are preliminary and upon coder review [place]  be revised to meet current compliance requirements. [name] MD, MD 11/28/2016 12:38:25 PM This report has been signed electronically. Number of Addenda: 0 Note Initiated On: 11/28/2016 11:52 AM      [facility]

## 2016-11-28 NOTE — Anesthesia Preprocedure Evaluation (Signed)
Anesthesia Evaluation  Patient identified by MRN, date of birth, ID band Patient awake    Reviewed: Allergy & Precautions, NPO status , Patient's Chart, lab work & pertinent test results  Airway Mallampati: II       Dental  (+) Upper Dentures   Pulmonary former smoker,     + decreased breath sounds      Cardiovascular Exercise Tolerance: Good hypertension, Pt. on medications and Pt. on home beta blockers  Rhythm:Regular Rate:Normal     Neuro/Psych negative psych ROS   GI/Hepatic Neg liver ROS, GERD  Medicated,  Endo/Other  diabetes, Type 2, Oral Hypoglycemic Agents  Renal/GU      Musculoskeletal   Abdominal (+) + obese,   Peds negative pediatric ROS (+)  Hematology  (+) anemia ,   Anesthesia Other Findings   Reproductive/Obstetrics                             Anesthesia Physical Anesthesia Plan  ASA: III  Anesthesia Plan: General   Post-op Pain Management:    Induction: Intravenous  PONV Risk Score and Plan:   Airway Management Planned: Natural Airway and Nasal Cannula  Additional Equipment:   Intra-op Plan:   Post-operative Plan:   Informed Consent: I have reviewed the patients History and Physical, chart, labs and discussed the procedure including the risks, benefits and alternatives for the proposed anesthesia with the patient or authorized representative who has indicated his/her understanding and acceptance.     Plan Discussed with: CRNA  Anesthesia Plan Comments:         Anesthesia Quick Evaluation

## 2016-11-28 NOTE — H&P (Signed)
Brandon Lame, MD [address] [place], [place] [address] Phone:[phone] Fax : [phone]  Primary Care Physician:  Letta Median, MD Primary Gastroenterologist:  Dr. Allen Norris  Pre-Procedure History & Physical: HPI:  Brandon Benjamin is a 71 y.o. male is here for an ERCP.   Past Medical History:  Diagnosis Date  . Anemia   . Balanitis   . Balanitis   . BPH (benign prostatic hyperplasia)   . Chronic kidney disease   . Diabetes ([place])   . Erectile dysfunction   . GERD (gastroesophageal reflux disease)   . HLD (hyperlipidemia)   . HTN (hypertension)   . Over weight   . Priapism   . Scrotal cyst   . Urinary urgency     Past Surgical History:  Procedure Laterality Date  . HOLEP-LASER ENUCLEATION OF THE PROSTATE WITH MORCELLATION N/A 07/11/2015   Procedure: HOLEP-LASER ENUCLEATION OF THE PROSTATE WITH MORCELLATION;  Surgeon: Hollice Espy, MD;  Location: [facility] ORS;  Service: Urology;  Laterality: N/A;    Prior to Admission medications   Medication Sig Start Date End Date Taking? Authorizing Provider  amLODipine (NORVASC) 5 MG tablet Take 10 mg by mouth daily.    Yes [provider]  hydrALAZINE (APRESOLINE) 25 MG tablet Take 25 mg by mouth 3 (three) times daily.   Yes [provider]  hydrochlorothiazide (HYDRODIURIL) 25 MG tablet Take 12.5 mg by mouth daily.    Yes [provider]  lisinopril (PRINIVIL,ZESTRIL) 40 MG tablet Take 40 mg by mouth daily.   Yes [provider]  metoprolol (LOPRESSOR) 100 MG tablet Take 100 mg by mouth 2 (two) times daily.  05/10/13  Yes [provider]  cetirizine (ZYRTEC) 10 MG tablet Take 10 mg by mouth daily.    [provider]  citalopram (CELEXA) 10 MG tablet Take 10 mg by mouth daily.    [provider]  diphenhydrAMINE (BENADRYL) 25 MG tablet Take 25 mg by mouth daily.    [provider]  docusate sodium (COLACE) 100 MG capsule Take 1 capsule (100 mg  total) by mouth 2 (two) times daily. 07/11/15   Hollice Espy, MD  fluticasone (FLONASE) 50 MCG/ACT nasal spray Place 2 sprays into both nostrils daily.  08/31/13   [provider]  lovastatin (MEVACOR) 20 MG tablet Take 20 mg by mouth at bedtime.    [provider]  metFORMIN (GLUCOPHAGE) 500 MG tablet Take 500 mg by mouth every evening.  04/05/15   [provider]  Multiple Vitamins-Minerals (CENTRAVITES 50 PLUS) TABS Take 1 tablet by mouth every morning.    [provider]  ranitidine (ZANTAC) 150 MG tablet Take 150 mg by mouth 2 (two) times daily.    [provider]    Allergies as of 11/26/2016  . (No Known Allergies)    Family History  Problem Relation Age of Onset  . Cancer Mother        blood stream  . High blood pressure Mother   . Prostate cancer Neg Hx   . Bladder Cancer Neg Hx   . Kidney disease Neg Hx     Social History   Social History  . Marital status: Married    Spouse name: N/A  . Number of children: N/A  . Years of education: N/A   Occupational History  . Not on file.   Social History Main Topics  . Smoking status: Former Smoker    Types: Cigarettes    Quit  date: 03/26/1995  . Smokeless tobacco: Never Used  . Alcohol use No  . Drug use: No  . Sexual activity: Not on file   Other Topics Concern  . Not on file   Social History Narrative  . No narrative on file    Review of Systems: See HPI, otherwise negative ROS  Physical Exam: BP (!) 151/82   Pulse (!) 59   Temp 98.4 F (36.9 C) (Tympanic)   Resp 18   Ht 5\' 9"  (1.753 m)   Wt 212 lb (96.2 kg)   SpO2 100%   BMI 31.31 kg/m  General:   Alert,  pleasant and cooperative in NAD Head:  Normocephalic and atraumatic. Neck:  Supple; no masses or thyromegaly. Lungs:  Clear throughout to auscultation.    Heart:  Regular rate and rhythm. Abdomen:  Soft, nontender and nondistended. Normal bowel sounds, without guarding, and without rebound.   Neurologic:   Alert and  oriented x4;  grossly normal neurologically.  Impression/Plan: [name] is here for an ERCP to be performed for CBD stones  Risks, benefits, limitations, and alternatives regarding  ERCP have been reviewed with the patient.  Questions have been answered.  All parties agreeable.   Brandon Lame, MD  11/28/2016, 11:40 AM

## 2016-11-28 NOTE — Anesthesia Postprocedure Evaluation (Signed)
Anesthesia Post Note  Patient: Brandon Benjamin  Procedure(s) Performed: Procedure(s) (LRB): ENDOSCOPIC RETROGRADE CHOLANGIOPANCREATOGRAPHY (ERCP) (N/A)  Patient location during evaluation: PACU Anesthesia Type: General Level of consciousness: awake Pain management: pain level controlled Vital Signs Assessment: post-procedure vital signs reviewed and stable Respiratory status: nonlabored ventilation Cardiovascular status: stable Anesthetic complications: no     Last Vitals:  Vitals:   11/28/16 1304 11/28/16 1314  BP: 123/77 117/77  Pulse: (!) 59 (!) 54  Resp: 18 18  Temp:    SpO2: 98% 95%    Last Pain:  Vitals:   11/28/16 1244  TempSrc: Tympanic  PainSc:                  VAN STAVEREN,Wendy Hoback

## 2016-11-28 NOTE — Transfer of Care (Signed)
  Immediate Anesthesia Transfer of Care Note  Patient: Brandon Benjamin  Procedure(s) Performed: Procedure(s): ENDOSCOPIC RETROGRADE CHOLANGIOPANCREATOGRAPHY (ERCP) (N/A)  Patient Location: PACU  Anesthesia Type:General  Level of Consciousness: awake and sedated  Airway & Oxygen Therapy: Patient Spontanous Breathing and Patient connected to nasal cannula oxygen  Post-op Assessment: Report given to RN and Post -op Vital signs reviewed and stable  Post vital signs: Reviewed and stable  Last Vitals:  Vitals:   11/28/16 1058  BP: (!) 151/82  Pulse: (!) 59  Resp: 18  Temp: 36.9 C  SpO2: 100%    Last Pain:  Vitals:   11/28/16 1058  TempSrc: Tympanic  PainSc: 3          Complications: No apparent anesthesia complications

## 2016-11-29 ENCOUNTER — Encounter: Payer: Self-pay | Admitting: Gastroenterology

## 2016-12-09 ENCOUNTER — Ambulatory Visit: Payer: Medicare Other | Admitting: Gastroenterology

## 2016-12-11 ENCOUNTER — Encounter: Payer: Self-pay | Admitting: Surgery

## 2016-12-11 ENCOUNTER — Ambulatory Visit (INDEPENDENT_AMBULATORY_CARE_PROVIDER_SITE_OTHER): Payer: Medicare Other | Admitting: Surgery

## 2016-12-11 ENCOUNTER — Telehealth: Payer: Self-pay

## 2016-12-11 ENCOUNTER — Telehealth: Payer: Self-pay | Admitting: Surgery

## 2016-12-11 VITALS — BP 154/87 | HR 59 | Temp 97.9°F | Ht 69.0 in | Wt 210.4 lb

## 2016-12-11 DIAGNOSIS — K8020 Calculus of gallbladder without cholecystitis without obstruction: Secondary | ICD-10-CM

## 2016-12-11 NOTE — Telephone Encounter (Signed)
Medical Clearance faxed at this time to [place] at [facility].

## 2016-12-11 NOTE — Progress Notes (Signed)
Outpatient Surgical Follow Up  12/11/2016  Banjamin Brandon Benjamin is an 71 y.o. male.   Chief Complaint  Patient presents with  . Follow-up    Discuss Cholecystectomy    HPI: Brandon Benjamin is following up after he had his ERCP. MRCP w choledocholithiasis. He has been doing well since then. Continues to have some mild intermittent right upper quadrant pain. No evidence of biliary obstruction. No evidence of cholecystitis or cholangitis. No evidence of complications from ERCP.  Past Medical History:  Diagnosis Date  . Anemia   . Balanitis   . Balanitis   . BPH (benign prostatic hyperplasia)   . Chronic kidney disease   . Diabetes ([place])   . Erectile dysfunction   . GERD (gastroesophageal reflux disease)   . HLD (hyperlipidemia)   . HTN (hypertension)   . Over weight   . Priapism   . Scrotal cyst   . Urinary urgency     Past Surgical History:  Procedure Laterality Date  . ERCP N/A 11/28/2016   Procedure: ENDOSCOPIC RETROGRADE CHOLANGIOPANCREATOGRAPHY (ERCP);  Surgeon: [name], MD;  Location: [facility] ENDOSCOPY;  Service: Endoscopy;  Laterality: N/A;  . HOLEP-LASER ENUCLEATION OF THE PROSTATE WITH MORCELLATION N/A 07/11/2015   Procedure: HOLEP-LASER ENUCLEATION OF THE PROSTATE WITH MORCELLATION;  Surgeon: Hollice Espy, MD;  Location: [facility] ORS;  Service: Urology;  Laterality: N/A;    Family History  Problem Relation Age of Onset  . Cancer Mother        blood stream  . High blood pressure Mother   . Prostate cancer Neg Hx   . Bladder Cancer Neg Hx   . Kidney disease Neg Hx     Social History:  reports that he quit smoking about 21 years ago. His smoking use included Cigarettes. He has never used smokeless tobacco. He reports that he does not drink alcohol or use drugs.  Allergies: No Known Allergies  Medications reviewed.    ROS Full ROS performed and is otherwise negative other than what is stated in HPI   BP (!) 154/87   Pulse (!) 59   Temp 97.9 F (36.6 C) (Oral)   Ht  5\' 9"  (1.753 m)   Wt 95.4 kg (210 lb 6.4 oz)   BMI 31.07 kg/m   Physical Exam  Constitutional: He is oriented to person, place, and time and well-developed, well-nourished, and in no distress. No distress.  Eyes: Right eye exhibits no discharge. Left eye exhibits no discharge.  Neck: Normal range of motion. No JVD present. No tracheal deviation present. No thyromegaly present.  Cardiovascular: Normal rate and intact distal pulses.   Pulmonary/Chest: Effort normal. No stridor. No respiratory distress. He exhibits no tenderness.  Abdominal: Soft. He exhibits no distension. There is no tenderness. There is no rebound and no guarding.  Reducible UH  Neurological: He is alert and oriented to person, place, and time. Gait normal. GCS score is 15.  Skin: Skin is warm and dry. He is not diaphoretic.  Psychiatric: Mood, memory, affect and judgment normal.  Nursing note and vitals reviewed.    Assessment/Plan: Symptomatic cholelithiasis and resolved choledocholithiasis in need for cholecystectomy. The risks, benefits, complications, treatment options, and expected outcomes were discussed with the patient. The possibilities of bleeding, recurrent infection, finding a normal gallbladder, perforation of viscus organs, damage to surrounding structures, bile leak, abscess formation, needing a drain placed, the need for additional procedures, reaction to medication, pulmonary aspiration,  failure to diagnose a condition, the possible need to convert to an  open procedure, and creating a complication requiring transfusion or operation were discussed with the patient. The patient and/or family concurred with the proposed plan, giving informed consent. We will perform UH at the same time.  Caroleen Hamman, MD [facility] General Surgeon

## 2016-12-11 NOTE — Telephone Encounter (Signed)
Pt advised of pre op date/time and sx date. Sx: 12/25/16 with Dr Pabon--Laparoscopic cholecystectomy with umbilical hernia repair.  Pre op: 12/19/16 @ 9:15am--office.   Patient made aware to call [phone], between 1-3:00pm the day before surgery, to find out what time to arrive.

## 2016-12-11 NOTE — Patient Instructions (Signed)
Umbilical Hernia, Adult A hernia is a bulge of tissue that pushes through an opening between muscles. An umbilical hernia happens in the abdomen, near the belly button (umbilicus). The hernia [place] contain tissues from the small intestine, large intestine, or fatty tissue covering the intestines (omentum). Umbilical hernias in adults tend to get worse over time, and they require surgical treatment. There are several types of umbilical hernias. You Baker have:  A hernia located just above or below the umbilicus (indirect hernia). This is the most common type of umbilical hernia in adults.  A hernia that forms through an opening formed by the umbilicus (direct hernia).  A hernia that comes and goes (reducible hernia). A reducible hernia Cliett be visible only when you strain, lift something heavy, or cough. This type of hernia can be pushed back into the abdomen (reduced).  A hernia that traps abdominal tissue inside the hernia (incarcerated hernia). This type of hernia cannot be reduced.  A hernia that cuts off blood flow to the tissues inside the hernia (strangulated hernia). The tissues can start to die if this happens. This type of hernia requires emergency treatment.  What are the causes? An umbilical hernia happens when tissue inside the abdomen presses on a weak area of the abdominal muscles. What increases the risk? You Gonzalo have a greater risk of this condition if you:  Are obese.  Have had several pregnancies.  Have a buildup of fluid inside your abdomen (ascites).  Have had surgery that weakens the abdominal muscles.  What are the signs or symptoms? The main symptom of this condition is a painless bulge at or near the belly button. A reducible hernia Demonbreun be visible only when you strain, lift something heavy, or cough. Other symptoms Baldwin include:  Dull pain.  A feeling of pressure.  Symptoms of a strangulated hernia Deroo include:  Pain that gets increasingly worse.  Nausea and  vomiting.  Pain when pressing on the hernia.  Skin over the hernia becoming red or purple.  Constipation.  Blood in the stool.  How is this diagnosed? This condition Grunden be diagnosed based on:  A physical exam. You Clausing be asked to cough or strain while standing. These actions increase the pressure inside your abdomen and force the hernia through the opening in your muscles. Your health care provider Boettcher try to reduce the hernia by pressing on it.  Your symptoms and medical history.  How is this treated? Surgery is the only treatment for an umbilical hernia. Surgery for a strangulated hernia is done as soon as possible. If you have a small hernia that is not incarcerated, you Polsky need to lose weight before having surgery. Follow these instructions at home:  Lose weight, if told by your health care provider.  Do not try to push the hernia back in.  Watch your hernia for any changes in color or size. Tell your health care provider if any changes occur.  You [place] need to avoid activities that increase pressure on your hernia.  Do not lift anything that is heavier than 10 lb (4.5 kg) until your health care provider says that this is safe.  Take over-the-counter and prescription medicines only as told by your health care provider.  Keep all follow-up visits as told by your health care provider. This is important. Contact a health care provider if:  Your hernia gets larger.  Your hernia becomes painful. Get help right away if:  You develop sudden, severe pain near the   area of your hernia.  You have pain as well as nausea or vomiting.  You have pain and the skin over your hernia changes color.  You develop a fever. This information is not intended to replace advice given to you by your health care provider. Make sure you discuss any questions you have with your health care provider. Document Released: 08/11/2015 Document Revised: 11/12/2015 Document Reviewed:  08/11/2015 Elsevier Interactive Patient Education  2018 [place] have requested to have your gallbladder removed. This will be done on 12/25/16 at [facility] with Dr. Dahlia Byes.  You will most likely be out of work 1-2 weeks for this surgery. You will return after your post-op appointment with a lifting restriction for approximately 4 more weeks.  You will be able to eat anything you would like to following surgery. But, start by eating a bland diet and advance this as tolerated. The Gallbladder diet is below, please go as closely by this diet as possible prior to surgery to avoid any further attacks.  Please see the (blue)pre-care form that you have been given today. If you have any questions, please call our office.  Laparoscopic Cholecystectomy Laparoscopic cholecystectomy is surgery to remove the gallbladder. The gallbladder is located in the upper right part of the abdomen, behind the liver. It is a storage sac for bile, which is produced in the liver. Bile aids in the digestion and absorption of fats. Cholecystectomy is often done for inflammation of the gallbladder (cholecystitis). This condition is usually caused by a buildup of gallstones (cholelithiasis) in the gallbladder. Gallstones can block the flow of bile, and that can result in inflammation and pain. In severe cases, emergency surgery Ulloa be required. If emergency surgery is not required, you will have time to prepare for the procedure. Laparoscopic surgery is an alternative to open surgery. Laparoscopic surgery has a shorter recovery time. Your common bile duct Tilly also need to be examined during the procedure. If stones are found in the common bile duct, they Wuest be removed. LET [facility] CARE PROVIDER KNOW ABOUT:  Any allergies you have.  All medicines you are taking, including vitamins, herbs, eye drops, creams, and over-the-counter medicines.  Previous problems you or members of your family have had with the  use of anesthetics.  Any blood disorders you have.  Previous surgeries you have had.    Any medical conditions you have. RISKS AND COMPLICATIONS Generally, this is a safe procedure. However, problems Potvin occur, including:  Infection.  Bleeding.  Allergic reactions to medicines.  Damage to other structures or organs.  A stone remaining in the common bile duct.  A bile leak from the cyst duct that is clipped when your gallbladder is removed.  The need to convert to open surgery, which requires a larger incision in the abdomen. This Bing be necessary if your surgeon thinks that it is not safe to continue with a laparoscopic procedure. BEFORE THE PROCEDURE  Ask your health care provider about:  Changing or stopping your regular medicines. This is especially important if you are taking diabetes medicines or blood thinners.  Taking medicines such as aspirin and ibuprofen. These medicines can thin your blood. Do not take these medicines before your procedure if your health care provider instructs you not to.  Follow instructions from your health care provider about eating or drinking restrictions.  Let your health care provider know if you develop a cold or an infection before surgery.  Plan to have someone take  you home after the procedure.  Ask your health care provider how your surgical site will be marked or identified.  You Proby be given antibiotic medicine to help prevent infection. PROCEDURE  To reduce your risk of infection:  Your health care team will wash or sanitize their hands.  Your skin will be washed with soap.  An IV tube Fales be inserted into one of your veins.  You will be given a medicine to make you fall asleep (general anesthetic).  A breathing tube will be placed in your mouth.  The surgeon will make several small cuts (incisions) in your abdomen.  A thin, lighted tube (laparoscope) that has a tiny camera on the end will be inserted through one of  the small incisions. The camera on the laparoscope will send a picture to a TV screen (monitor) in the operating room. This will give the surgeon a good view inside your abdomen.  A gas will be pumped into your abdomen. This will expand your abdomen to give the surgeon more room to perform the surgery.  Other tools that are needed for the procedure will be inserted through the other incisions. The gallbladder will be removed through one of the incisions.  After your gallbladder has been removed, the incisions will be closed with stitches (sutures), staples, or skin glue.  Your incisions Troup be covered with a bandage (dressing). The procedure Ratterman vary among health care providers and hospitals. AFTER THE PROCEDURE  Your blood pressure, heart rate, breathing rate, and blood oxygen level will be monitored often until the medicines you were given have worn off.  You will be given medicines as needed to control your pain.   This information is not intended to replace advice given to you by your health care provider. Make sure you discuss any questions you have with your health care provider.   Document Released: 03/11/2005 Document Revised: 11/30/2014 Document Reviewed: 10/21/2012 Elsevier Interactive Patient Education 2016 [place] Diet for Gallbladder Conditions A low-fat diet can be helpful if you have pancreatitis or a gallbladder condition. With these conditions, your pancreas and gallbladder have trouble digesting fats. A healthy eating plan with less fat will help rest your pancreas and gallbladder and reduce your symptoms. WHAT DO I NEED TO KNOW ABOUT THIS DIET?  Eat a low-fat diet.  Reduce your fat intake to less than 20-30% of your total daily calories. This is less than 50-60 g of fat per day.  Remember that you need some fat in your diet. Ask your dietician what your daily goal should be.  Choose nonfat and low-fat healthy foods. Look for the words "nonfat," "low  fat," or "fat free."  As a guide, look on the label and choose foods with less than 3 g of fat per serving. Eat only one serving.  Avoid alcohol.  Do not smoke. If you need help quitting, talk with your health care provider.  Eat small frequent meals instead of three large heavy meals. WHAT FOODS CAN I EAT? Grains Include healthy grains and starches such as potatoes, wheat bread, fiber-rich cereal, and brown rice. Choose whole grain options whenever possible. In adults, whole grains should account for 45-65% of your daily calories.  Fruits and Vegetables Eat plenty of fruits and vegetables. Fresh fruits and vegetables add fiber to your diet. Meats and Other Protein Sources Eat lean meat such as chicken and pork. Trim any fat off of meat before cooking it. Eggs, fish, and beans are  other sources of protein. In adults, these foods should account for 10-35% of your daily calories. Dairy Choose low-fat milk and dairy options. Dairy includes fat and protein, as well as calcium.  Fats and Oils Limit high-fat foods such as fried foods, sweets, baked goods, sugary drinks.  Other Creamy sauces and condiments, such as mayonnaise, can add extra fat. Think about whether or not you need to use them, or use smaller amounts or low fat options. WHAT FOODS ARE NOT RECOMMENDED?  High fat foods, such as:  [employer].  Ice cream.  [place] toast.  Sweet rolls.  Pizza.  Cheese bread.  Foods covered with batter, butter, creamy sauces, or cheese.  Fried foods.  Sugary drinks and desserts.  Foods that cause gas or bloating   This information is not intended to replace advice given to you by your health care provider. Make sure you discuss any questions you have with your health care provider.   Document Released: 03/16/2013 Document Reviewed: 03/16/2013 Elsevier Interactive Patient Education 2016 Elsevier In.

## 2016-12-12 ENCOUNTER — Ambulatory Visit: Payer: Medicare Other | Admitting: Surgery

## 2016-12-19 ENCOUNTER — Encounter
Admission: RE | Admit: 2016-12-19 | Discharge: 2016-12-19 | Disposition: A | Discharge status: Home / Self Care | Payer: Medicare Other | Source: Ambulatory Visit | Attending: Surgery | Admitting: Surgery

## 2016-12-19 DIAGNOSIS — L729 Follicular cyst of the skin and subcutaneous tissue, unspecified: Secondary | ICD-10-CM | POA: Insufficient documentation

## 2016-12-19 DIAGNOSIS — K8050 Calculus of bile duct without cholangitis or cholecystitis without obstruction: Secondary | ICD-10-CM | POA: Insufficient documentation

## 2016-12-19 DIAGNOSIS — Z01810 Encounter for preprocedural cardiovascular examination: Secondary | ICD-10-CM | POA: Diagnosis present

## 2016-12-19 DIAGNOSIS — E669 Obesity, unspecified: Secondary | ICD-10-CM | POA: Insufficient documentation

## 2016-12-19 DIAGNOSIS — R001 Bradycardia, unspecified: Secondary | ICD-10-CM | POA: Insufficient documentation

## 2016-12-19 DIAGNOSIS — I120 Hypertensive chronic kidney disease with stage 5 chronic kidney disease or end stage renal disease: Secondary | ICD-10-CM | POA: Insufficient documentation

## 2016-12-19 DIAGNOSIS — K8020 Calculus of gallbladder without cholecystitis without obstruction: Secondary | ICD-10-CM | POA: Insufficient documentation

## 2016-12-19 DIAGNOSIS — Z01812 Encounter for preprocedural laboratory examination: Secondary | ICD-10-CM | POA: Insufficient documentation

## 2016-12-19 DIAGNOSIS — D649 Anemia, unspecified: Secondary | ICD-10-CM | POA: Diagnosis not present

## 2016-12-19 DIAGNOSIS — E1122 Type 2 diabetes mellitus with diabetic chronic kidney disease: Secondary | ICD-10-CM | POA: Diagnosis not present

## 2016-12-19 DIAGNOSIS — N189 Chronic kidney disease, unspecified: Secondary | ICD-10-CM | POA: Insufficient documentation

## 2016-12-19 HISTORY — DX: Anxiety disorder, unspecified: F41.9

## 2016-12-19 LAB — POTASSIUM: Potassium: 3.1 mmol/L — ABNORMAL LOW (ref 3.5–5.1)

## 2016-12-19 MED ORDER — CHLORHEXIDINE GLUCONATE CLOTH 2 % EX PADS
6.0000 | MEDICATED_PAD | Freq: Once | CUTANEOUS | Status: DC
  Filled 2016-12-19: qty 6

## 2016-12-19 NOTE — Pre-Procedure Instructions (Signed)
Potassium 3.1 a potassium supplement requested.

## 2016-12-19 NOTE — Patient Instructions (Signed)
Your procedure is scheduled on: 12/25/16 Wed  Report to Same Day Surgery 2nd floor medical mall [facility] Entrance-take elevator on left to 2nd floor.  Check in with surgery information desk.) To find out your arrival time please call [phone] between 1PM - 3PM on  12/24/16 Tues  Remember: Instructions that are not followed completely Rohrig result in serious medical risk, up to and including death, or upon the discretion of your surgeon and anesthesiologist your surgery Conaty need to be rescheduled.    _x___ 1. Do not eat food after midnight the night before your procedure. You Pinkley drink clear liquids up to 2 hours before you are scheduled to arrive at the hospital for your procedure.  Do not drink clear liquids within 2 hours of your scheduled arrival to the hospital.  Clear liquids include  --Water or Apple juice without pulp  --Clear carbohydrate beverage such as ClearFast or Gatorade  --Black Coffee or Clear Tea (No milk, no creamers, do not add anything to                  the coffee or Tea Type 1 and type 2 diabetics should only drink water.  No gum chewing or hard candies.     __x__ 2. No Alcohol for 24 hours before or after surgery.   __x__3. No Smoking for 24 prior to surgery.   ____  4. Bring all medications with you on the day of surgery if instructed.    __x__ 5. Notify your doctor if there is any change in your medical condition     (cold, fever, infections).     Do not wear jewelry, make-up, hairpins, clips or nail polish.  Do not wear lotions, powders, or perfumes. You [place] wear deodorant.  Do not shave 48 hours prior to surgery. Men Janes shave face and neck.  Do not bring valuables to the hospital.    [facility] is not responsible for any belongings or valuables.               Contacts, dentures or bridgework Maull not be worn into surgery.  Leave your suitcase in the car. After surgery it Stouffer be brought to your room.  For patients admitted to the hospital,  discharge time is determined by your                       treatment team.   Patients discharged the day of surgery will not be allowed to drive home.  You will need someone to drive you home and stay with you the night of your procedure.    Please read over the following fact sheets that you were given:   [facility] Preparing for Surgery and or MRSA Information   _x___ Take anti-hypertensive listed below, cardiac, seizure, asthma,     anti-reflux and psychiatric medicines. These include:  1. amLODipine (NORVASC) 10 MG tablet  2.hydrALAZINE (APRESOLINE) 25 MG tablet  3.metoprolol (LOPRESSOR) 100 MG tablet  4.ranitidine (ZANTAC) 150 MG tablet  5.  6.  ____Fleets enema or Magnesium Citrate as directed.   _x___ Use CHG Soap or sage wipes as directed on instruction sheet   ____ Use inhalers on the day of surgery and bring to hospital day of surgery  _x___ Stop Metformin and Janumet 2 days prior to surgery.    ____ Take 1/2 of usual insulin dose the night before surgery and none on the morning     surgery.  _x___ Follow recommendations from Cardiologist, Pulmonologist or PCP regarding          stopping Aspirin, Coumadin, Plavix ,Eliquis, Effient, or Pradaxa, and Pletal.  X____Stop Anti-inflammatories such as Advil, Aleve, Ibuprofen, Motrin, Naproxen, Naprosyn, Goodies powders or aspirin products. OK to take Tylenol and                          Celebrex.   _x___ Stop supplements until after surgery.  But Feagans continue Vitamin D, Vitamin B,       and multivitamin.   ____ Bring C-Pap to the hospital.

## 2016-12-24 MED ORDER — CEFAZOLIN SODIUM-DEXTROSE 2-4 GM/100ML-% IV SOLN
2.0000 g | INTRAVENOUS | Status: AC
  Administered 2016-12-25: 2 g via INTRAVENOUS

## 2016-12-25 ENCOUNTER — Encounter: Payer: Self-pay | Admitting: *Deleted

## 2016-12-25 ENCOUNTER — Ambulatory Visit: Payer: Medicare Other

## 2016-12-25 ENCOUNTER — Observation Stay
Admission: RE | Admit: 2016-12-25 | Discharge: 2016-12-26 | Disposition: A | Discharge status: Home / Self Care | Payer: Medicare Other | Source: Ambulatory Visit | Attending: Surgery | Admitting: Surgery

## 2016-12-25 ENCOUNTER — Ambulatory Visit: Payer: Medicare Other | Admitting: Anesthesiology

## 2016-12-25 ENCOUNTER — Encounter
Admission: RE | Disposition: A | Discharge status: Home / Self Care | Payer: Self-pay | Source: Ambulatory Visit | Attending: Surgery

## 2016-12-25 DIAGNOSIS — Z9049 Acquired absence of other specified parts of digestive tract: Secondary | ICD-10-CM

## 2016-12-25 DIAGNOSIS — N189 Chronic kidney disease, unspecified: Secondary | ICD-10-CM | POA: Insufficient documentation

## 2016-12-25 DIAGNOSIS — I129 Hypertensive chronic kidney disease with stage 1 through stage 4 chronic kidney disease, or unspecified chronic kidney disease: Secondary | ICD-10-CM | POA: Insufficient documentation

## 2016-12-25 DIAGNOSIS — E785 Hyperlipidemia, unspecified: Secondary | ICD-10-CM | POA: Diagnosis not present

## 2016-12-25 DIAGNOSIS — E1122 Type 2 diabetes mellitus with diabetic chronic kidney disease: Secondary | ICD-10-CM | POA: Insufficient documentation

## 2016-12-25 DIAGNOSIS — K219 Gastro-esophageal reflux disease without esophagitis: Secondary | ICD-10-CM | POA: Insufficient documentation

## 2016-12-25 DIAGNOSIS — K8020 Calculus of gallbladder without cholecystitis without obstruction: Secondary | ICD-10-CM | POA: Diagnosis present

## 2016-12-25 DIAGNOSIS — K8010 Calculus of gallbladder with chronic cholecystitis without obstruction: Principal | ICD-10-CM | POA: Insufficient documentation

## 2016-12-25 DIAGNOSIS — K429 Umbilical hernia without obstruction or gangrene: Secondary | ICD-10-CM

## 2016-12-25 DIAGNOSIS — Z87891 Personal history of nicotine dependence: Secondary | ICD-10-CM | POA: Insufficient documentation

## 2016-12-25 DIAGNOSIS — Z6831 Body mass index (BMI) 31.0-31.9, adult: Secondary | ICD-10-CM | POA: Diagnosis not present

## 2016-12-25 DIAGNOSIS — E663 Overweight: Secondary | ICD-10-CM | POA: Insufficient documentation

## 2016-12-25 DIAGNOSIS — Z23 Encounter for immunization: Secondary | ICD-10-CM | POA: Insufficient documentation

## 2016-12-25 DIAGNOSIS — N400 Enlarged prostate without lower urinary tract symptoms: Secondary | ICD-10-CM | POA: Insufficient documentation

## 2016-12-25 DIAGNOSIS — Z419 Encounter for procedure for purposes other than remedying health state, unspecified: Secondary | ICD-10-CM

## 2016-12-25 DIAGNOSIS — Z7984 Long term (current) use of oral hypoglycemic drugs: Secondary | ICD-10-CM | POA: Diagnosis not present

## 2016-12-25 DIAGNOSIS — Z79899 Other long term (current) drug therapy: Secondary | ICD-10-CM | POA: Diagnosis not present

## 2016-12-25 DIAGNOSIS — K810 Acute cholecystitis: Secondary | ICD-10-CM | POA: Diagnosis not present

## 2016-12-25 HISTORY — PX: UMBILICAL HERNIA REPAIR: SHX196

## 2016-12-25 HISTORY — PX: CHOLECYSTECTOMY: SHX55

## 2016-12-25 LAB — CBC
HCT: 34.2 % — ABNORMAL LOW (ref 40.0–52.0)
HEMOGLOBIN: 10.8 g/dL — AB (ref 13.0–18.0)
MCH: 22.4 pg — AB (ref 26.0–34.0)
MCHC: 31.5 g/dL — AB (ref 32.0–36.0)
MCV: 71.1 fL — AB (ref 80.0–100.0)
Platelets: 206 10*3/uL (ref 150–440)
RBC: 4.82 MIL/uL (ref 4.40–5.90)
RDW: 19.2 % — ABNORMAL HIGH (ref 11.5–14.5)
WBC: 12.1 10*3/uL — ABNORMAL HIGH (ref 3.8–10.6)

## 2016-12-25 LAB — GLUCOSE, CAPILLARY
GLUCOSE-CAPILLARY: 117 mg/dL — AB (ref 65–99)
GLUCOSE-CAPILLARY: 160 mg/dL — AB (ref 65–99)
Glucose-Capillary: 153 mg/dL — ABNORMAL HIGH (ref 65–99)
Glucose-Capillary: 158 mg/dL — ABNORMAL HIGH (ref 65–99)

## 2016-12-25 LAB — POCT I-STAT 4, (NA,K, GLUC, HGB,HCT)
Glucose, Bld: 112 mg/dL — ABNORMAL HIGH (ref 65–99)
HEMATOCRIT: 37 % — AB (ref 39.0–52.0)
Hemoglobin: 12.6 g/dL — ABNORMAL LOW (ref 13.0–17.0)
Potassium: 3.5 mmol/L (ref 3.5–5.1)
Sodium: 140 mmol/L (ref 135–145)

## 2016-12-25 LAB — CREATININE, SERUM
CREATININE: 1.28 mg/dL — AB (ref 0.61–1.24)
GFR calc Af Amer: >60 mL/min (ref >60)
GFR calc non Af Amer: 55 mL/min — ABNORMAL LOW (ref >60)

## 2016-12-25 SURGERY — LAPAROSCOPIC CHOLECYSTECTOMY WITH INTRAOPERATIVE CHOLANGIOGRAM
Anesthesia: General | Wound class: Clean Contaminated

## 2016-12-25 MED ORDER — ONDANSETRON HCL 4 MG/2ML IJ SOLN: 4.0000 mg | Freq: Four times a day (QID) | INTRAMUSCULAR | Status: DC | PRN

## 2016-12-25 MED ORDER — MIDAZOLAM HCL 2 MG/2ML IJ SOLN
INTRAMUSCULAR | Status: AC
  Filled 2016-12-25: qty 2

## 2016-12-25 MED ORDER — SODIUM CHLORIDE 0.9 % IV SOLN
INTRAVENOUS | Status: DC
  Administered 2016-12-25: 20 mL/h via INTRAVENOUS

## 2016-12-25 MED ORDER — PROPOFOL 10 MG/ML IV BOLUS
INTRAVENOUS | Status: DC | PRN
  Administered 2016-12-25: 200 mg via INTRAVENOUS

## 2016-12-25 MED ORDER — HEPARIN SODIUM (PORCINE) 5000 UNIT/ML IJ SOLN
5000.0000 [IU] | Freq: Three times a day (TID) | INTRAMUSCULAR | Status: DC
  Administered 2016-12-25 – 2016-12-26 (×3): 5000 [IU] via SUBCUTANEOUS
  Filled 2016-12-25 (×3): qty 1

## 2016-12-25 MED ORDER — ACETAMINOPHEN 10 MG/ML IV SOLN
INTRAVENOUS | Status: DC | PRN
  Administered 2016-12-25: 1000 mg via INTRAVENOUS

## 2016-12-25 MED ORDER — MIDAZOLAM HCL 2 MG/2ML IJ SOLN
INTRAMUSCULAR | Status: DC | PRN
  Administered 2016-12-25: 2 mg via INTRAVENOUS

## 2016-12-25 MED ORDER — IOPAMIDOL (ISOVUE-M 200) INJECTION 41%
INTRAMUSCULAR | Status: AC
  Filled 2016-12-25: qty 10

## 2016-12-25 MED ORDER — CEFAZOLIN SODIUM-DEXTROSE 2-4 GM/100ML-% IV SOLN
INTRAVENOUS | Status: AC
  Filled 2016-12-25: qty 100

## 2016-12-25 MED ORDER — PROPOFOL 10 MG/ML IV BOLUS
INTRAVENOUS | Status: AC
  Filled 2016-12-25: qty 20

## 2016-12-25 MED ORDER — ROCURONIUM BROMIDE 50 MG/5ML IV SOLN
INTRAVENOUS | Status: AC
  Filled 2016-12-25: qty 1

## 2016-12-25 MED ORDER — EPHEDRINE SULFATE 50 MG/ML IJ SOLN
INTRAMUSCULAR | Status: DC | PRN
  Administered 2016-12-25: 10 mg via INTRAVENOUS

## 2016-12-25 MED ORDER — SUGAMMADEX SODIUM 200 MG/2ML IV SOLN
INTRAVENOUS | Status: AC
  Filled 2016-12-25: qty 2

## 2016-12-25 MED ORDER — LACTATED RINGERS IV SOLN
INTRAVENOUS | Status: DC
  Administered 2016-12-25 – 2016-12-26 (×2): via INTRAVENOUS

## 2016-12-25 MED ORDER — ONDANSETRON 4 MG PO TBDP: 4.0000 mg | ORAL_TABLET | Freq: Four times a day (QID) | ORAL | Status: DC | PRN

## 2016-12-25 MED ORDER — FENTANYL CITRATE (PF) 100 MCG/2ML IJ SOLN
INTRAMUSCULAR | Status: AC
  Filled 2016-12-25: qty 2

## 2016-12-25 MED ORDER — INFLUENZA VAC SPLIT HIGH-DOSE 0.5 ML IM SUSY
0.5000 mL | PREFILLED_SYRINGE | INTRAMUSCULAR | Status: AC
  Administered 2016-12-26: 0.5 mL via INTRAMUSCULAR
  Filled 2016-12-25 (×2): qty 0.5

## 2016-12-25 MED ORDER — ACETAMINOPHEN 500 MG PO TABS
1000.0000 mg | ORAL_TABLET | Freq: Four times a day (QID) | ORAL | Status: DC
  Administered 2016-12-25 – 2016-12-26 (×3): 1000 mg via ORAL
  Filled 2016-12-25 (×3): qty 2

## 2016-12-25 MED ORDER — ACETAMINOPHEN 10 MG/ML IV SOLN
INTRAVENOUS | Status: AC
  Filled 2016-12-25: qty 100

## 2016-12-25 MED ORDER — DEXAMETHASONE SODIUM PHOSPHATE 10 MG/ML IJ SOLN
INTRAMUSCULAR | Status: DC | PRN
  Administered 2016-12-25: 5 mg via INTRAVENOUS

## 2016-12-25 MED ORDER — MORPHINE SULFATE (PF) 4 MG/ML IV SOLN: 2.0000 mg | INTRAVENOUS | Status: DC | PRN

## 2016-12-25 MED ORDER — INSULIN ASPART 100 UNIT/ML ~~LOC~~ SOLN
0.0000 [IU] | Freq: Three times a day (TID) | SUBCUTANEOUS | Status: DC
  Administered 2016-12-25: 3 [IU] via SUBCUTANEOUS
  Administered 2016-12-26: 2 [IU] via SUBCUTANEOUS
  Filled 2016-12-25 (×2): qty 1

## 2016-12-25 MED ORDER — HYDROMORPHONE HCL 1 MG/ML IJ SOLN: 0.5000 mg | INTRAMUSCULAR | Status: DC | PRN

## 2016-12-25 MED ORDER — SUGAMMADEX SODIUM 200 MG/2ML IV SOLN
INTRAVENOUS | Status: DC | PRN
  Administered 2016-12-25: 200 mg via INTRAVENOUS

## 2016-12-25 MED ORDER — ONDANSETRON HCL 4 MG/2ML IJ SOLN: 4.0000 mg | Freq: Once | INTRAMUSCULAR | Status: DC | PRN

## 2016-12-25 MED ORDER — HYDROMORPHONE HCL 1 MG/ML IJ SOLN
INTRAMUSCULAR | Status: AC
  Filled 2016-12-25: qty 1

## 2016-12-25 MED ORDER — EPHEDRINE SULFATE 50 MG/ML IJ SOLN
INTRAMUSCULAR | Status: AC
  Filled 2016-12-25: qty 1

## 2016-12-25 MED ORDER — HYDRALAZINE HCL 20 MG/ML IJ SOLN: 10.0000 mg | INTRAMUSCULAR | Status: DC | PRN

## 2016-12-25 MED ORDER — ONDANSETRON HCL 4 MG/2ML IJ SOLN
INTRAMUSCULAR | Status: DC | PRN
  Administered 2016-12-25: 4 mg via INTRAVENOUS

## 2016-12-25 MED ORDER — OXYCODONE HCL 5 MG PO TABS
5.0000 mg | ORAL_TABLET | ORAL | Status: DC | PRN
  Administered 2016-12-25: 5 mg via ORAL
  Filled 2016-12-25: qty 1

## 2016-12-25 MED ORDER — BUPIVACAINE-EPINEPHRINE (PF) 0.25% -1:200000 IJ SOLN
INTRAMUSCULAR | Status: AC
  Filled 2016-12-25: qty 30

## 2016-12-25 MED ORDER — BUPIVACAINE-EPINEPHRINE 0.25% -1:200000 IJ SOLN
INTRAMUSCULAR | Status: DC | PRN
  Administered 2016-12-25: 30 mL

## 2016-12-25 MED ORDER — FENTANYL CITRATE (PF) 100 MCG/2ML IJ SOLN: 25.0000 ug | INTRAMUSCULAR | Status: DC | PRN

## 2016-12-25 MED ORDER — ROCURONIUM BROMIDE 100 MG/10ML IV SOLN
INTRAVENOUS | Status: DC | PRN
  Administered 2016-12-25: 20 mg via INTRAVENOUS
  Administered 2016-12-25: 30 mg via INTRAVENOUS
  Administered 2016-12-25: 20 mg via INTRAVENOUS

## 2016-12-25 MED ORDER — PANTOPRAZOLE SODIUM 40 MG IV SOLR
40.0000 mg | Freq: Two times a day (BID) | INTRAVENOUS | Status: DC
  Administered 2016-12-25 – 2016-12-26 (×3): 40 mg via INTRAVENOUS
  Filled 2016-12-25 (×3): qty 40

## 2016-12-25 MED ORDER — PNEUMOCOCCAL VAC POLYVALENT 25 MCG/0.5ML IJ INJ
0.5000 mL | INJECTION | INTRAMUSCULAR | Status: AC
  Administered 2016-12-26: 0.5 mL via INTRAMUSCULAR
  Filled 2016-12-25: qty 0.5

## 2016-12-25 MED ORDER — SODIUM CHLORIDE 0.9 % IV SOLN
INTRAVENOUS | Status: DC | PRN
  Administered 2016-12-25: 40 mL

## 2016-12-25 MED ORDER — FENTANYL CITRATE (PF) 100 MCG/2ML IJ SOLN
INTRAMUSCULAR | Status: DC | PRN
  Administered 2016-12-25: 100 ug via INTRAVENOUS
  Administered 2016-12-25 (×2): 50 ug via INTRAVENOUS
  Administered 2016-12-25: 100 ug via INTRAVENOUS

## 2016-12-25 SURGICAL SUPPLY — 51 items
APPLICATOR COTTON TIP 6IN STRL (MISCELLANEOUS) ×4 IMPLANT
APPLIER CLIP 5 13 M/L LIGAMAX5 (MISCELLANEOUS) ×4
BLADE SURG 15 STRL LF DISP TIS (BLADE) ×2 IMPLANT
BLADE SURG 15 STRL SS (BLADE) ×2
BULB RESERV EVAC DRAIN JP 100C (MISCELLANEOUS) ×8 IMPLANT
CANISTER SUCT 1200ML W/VALVE (MISCELLANEOUS) ×4 IMPLANT
CHLORAPREP W/TINT 26ML (MISCELLANEOUS) ×4 IMPLANT
CHOLANGIOGRAM CATH TAUT (CATHETERS) ×4 IMPLANT
CLEANER CAUTERY TIP 5X5 PAD (MISCELLANEOUS) IMPLANT
CLIP APPLIE 5 13 M/L LIGAMAX5 (MISCELLANEOUS) ×2 IMPLANT
DECANTER SPIKE VIAL GLASS SM (MISCELLANEOUS) ×8 IMPLANT
DERMABOND ADVANCED (GAUZE/BANDAGES/DRESSINGS) ×2
DERMABOND ADVANCED .7 DNX12 (GAUZE/BANDAGES/DRESSINGS) ×2 IMPLANT
DRAIN CHANNEL JP 19F (MISCELLANEOUS) ×8 IMPLANT
DRAPE C-ARM XRAY 36X54 (DRAPES) IMPLANT
ELECT CAUTERY BLADE 6.4 (BLADE) ×4 IMPLANT
ELECT REM PT RETURN 9FT ADLT (ELECTROSURGICAL) ×4
ELECTRODE REM PT RTRN 9FT ADLT (ELECTROSURGICAL) ×2 IMPLANT
ENDOLOOP SUT PDS II  0 18 (SUTURE) ×4
ENDOLOOP SUT PDS II 0 18 (SUTURE) ×4 IMPLANT
GLOVE BIO SURGEON STRL SZ7 (GLOVE) ×24 IMPLANT
GOWN STRL REUS W/ TWL LRG LVL3 (GOWN DISPOSABLE) ×6 IMPLANT
GOWN STRL REUS W/TWL LRG LVL3 (GOWN DISPOSABLE) ×6
IRRIGATION STRYKERFLOW (MISCELLANEOUS) ×2 IMPLANT
IRRIGATOR STRYKERFLOW (MISCELLANEOUS) ×4
IV CATH ANGIO 12GX3 LT BLUE (NEEDLE) ×4 IMPLANT
IV NS 1000ML (IV SOLUTION) ×2
IV NS 1000ML BAXH (IV SOLUTION) ×2 IMPLANT
L-HOOK LAP DISP 36CM (ELECTROSURGICAL) ×4
LHOOK LAP DISP 36CM (ELECTROSURGICAL) ×2 IMPLANT
NEEDLE HYPO 22GX1.5 SAFETY (NEEDLE) ×4 IMPLANT
PACK LAP CHOLECYSTECTOMY (MISCELLANEOUS) ×4 IMPLANT
PAD CLEANER CAUTERY TIP 5X5 (MISCELLANEOUS)
PENCIL ELECTRO HAND CTR (MISCELLANEOUS) ×4 IMPLANT
POUCH SPECIMEN RETRIEVAL 10MM (ENDOMECHANICALS) ×4 IMPLANT
SCISSORS METZENBAUM CVD 33 (INSTRUMENTS) ×4 IMPLANT
SLEEVE ENDOPATH XCEL 5M (ENDOMECHANICALS) ×8 IMPLANT
SOL ANTI-FOG 6CC FOG-OUT (MISCELLANEOUS) ×2 IMPLANT
SOL FOG-OUT ANTI-FOG 6CC (MISCELLANEOUS) ×2
SPONGE DRAIN TRACH 4X4 STRL 2S (GAUZE/BANDAGES/DRESSINGS) ×4 IMPLANT
SPONGE LAP 18X18 5 PK (GAUZE/BANDAGES/DRESSINGS) ×4 IMPLANT
STOPCOCK 4 WAY LG BORE MALE ST (IV SETS) ×4 IMPLANT
SUT ETHIBOND 0 MO6 C/R (SUTURE) ×4 IMPLANT
SUT ETHILON 3 0 FSLX (SUTURE) ×4 IMPLANT
SUT MNCRL AB 4-0 PS2 18 (SUTURE) ×4 IMPLANT
SUT VICRYL 0 AB UR-6 (SUTURE) ×8 IMPLANT
SYR 20CC LL (SYRINGE) ×4 IMPLANT
TROCAR XCEL BLUNT TIP 100MML (ENDOMECHANICALS) ×4 IMPLANT
TROCAR XCEL NON-BLD 5MMX100MML (ENDOMECHANICALS) ×4 IMPLANT
TUBING INSUFFLATOR HI FLOW (MISCELLANEOUS) ×4 IMPLANT
WATER STERILE IRR 1000ML POUR (IV SOLUTION) IMPLANT

## 2016-12-25 NOTE — Anesthesia Post-op Follow-up Note (Signed)
Anesthesia QCDR form completed.        

## 2016-12-25 NOTE — Anesthesia Preprocedure Evaluation (Signed)
Anesthesia Evaluation  Patient identified by MRN, date of birth, ID band Patient awake    Reviewed: Allergy & Precautions, NPO status , Patient's Chart, lab work & pertinent test results, reviewed documented beta blocker date and time   Airway Mallampati: II  TM Distance: >3 FB Neck ROM: Full    Dental  (+) Edentulous Lower, Edentulous Upper   Pulmonary former smoker,    Pulmonary exam normal breath sounds clear to auscultation       Cardiovascular hypertension, Pt. on medications and Pt. on home beta blockers Normal cardiovascular exam     Neuro/Psych negative neurological ROS  negative psych ROS   GI/Hepatic Neg liver ROS, GERD  Medicated and Controlled,  Endo/Other  diabetes, Well Controlled, Type 2, Oral Hypoglycemic Agents  Renal/GU Renal InsufficiencyRenal disease     Musculoskeletal negative musculoskeletal ROS (+)   Abdominal Normal abdominal exam  (+)   Peds negative pediatric ROS (+)  Hematology  (+) anemia ,   Anesthesia Other Findings Prostate BPH  Reproductive/Obstetrics                             Anesthesia Physical  Anesthesia Plan  ASA: III  Anesthesia Plan: General   Post-op Pain Management:    Induction: Intravenous  PONV Risk Score and Plan:   Airway Management Planned: Oral ETT  Additional Equipment:   Intra-op Plan:   Post-operative Plan: Extubation in OR  Informed Consent: I have reviewed the patients History and Physical, chart, labs and discussed the procedure including the risks, benefits and alternatives for the proposed anesthesia with the patient or authorized representative who has indicated his/her understanding and acceptance.   Dental advisory given  Plan Discussed with: Surgeon and CRNA  Anesthesia Plan Comments:         Anesthesia Quick Evaluation

## 2016-12-25 NOTE — H&P (View-Only) (Signed)
Outpatient Surgical Follow Up  12/11/2016  Brandon Benjamin is an 71 y.o. male.   Chief Complaint  Patient presents with  . Follow-up    Discuss Cholecystectomy    HPI: Brandon Benjamin is following up after he had his ERCP. MRCP w choledocholithiasis. He has been doing well since then. Continues to have some mild intermittent right upper quadrant pain. No evidence of biliary obstruction. No evidence of cholecystitis or cholangitis. No evidence of complications from ERCP.  Past Medical History:  Diagnosis Date  . Anemia   . Balanitis   . Balanitis   . BPH (benign prostatic hyperplasia)   . Chronic kidney disease   . Diabetes ([place])   . Erectile dysfunction   . GERD (gastroesophageal reflux disease)   . HLD (hyperlipidemia)   . HTN (hypertension)   . Over weight   . Priapism   . Scrotal cyst   . Urinary urgency     Past Surgical History:  Procedure Laterality Date  . ERCP N/A 11/28/2016   Procedure: ENDOSCOPIC RETROGRADE CHOLANGIOPANCREATOGRAPHY (ERCP);  Surgeon: [name], MD;  Location[facility] ENDOSCOPY;  Service: Endoscopy;  Laterality: N/A;  . HOLEP-LASER ENUCLEATION OF THE PROSTATE WITH MORCELLATION N/A 07/11/2015   Procedure: HOLEP-LASER ENUCLEATION OF THE PROSTATE WITH MORCELLATION;  Surgeon: Hollice Espy, MD;  Location: [facility] ORS;  Service: Urology;  Laterality: N/A;    Family History  Problem Relation Age of Onset  . Cancer Mother        blood stream  . High blood pressure Mother   . Prostate cancer Neg Hx   . Bladder Cancer Neg Hx   . Kidney disease Neg Hx     Social History:  reports that he quit smoking about 21 years ago. His smoking use included Cigarettes. He has never used smokeless tobacco. He reports that he does not drink alcohol or use drugs.  Allergies: No Known Allergies  Medications reviewed.    ROS Full ROS performed and is otherwise negative other than what is stated in HPI   BP (!) 154/87   Pulse (!) 59   Temp 97.9 F (36.6 C) (Oral)   Ht  5\' 9"  (1.753 m)   Wt 95.4 kg (210 lb 6.4 oz)   BMI 31.07 kg/m   Physical Exam  Constitutional: He is oriented to person, place, and time and well-developed, well-nourished, and in no distress. No distress.  Eyes: Right eye exhibits no discharge. Left eye exhibits no discharge.  Neck: Normal range of motion. No JVD present. No tracheal deviation present. No thyromegaly present.  Cardiovascular: Normal rate and intact distal pulses.   Pulmonary/Chest: Effort normal. No stridor. No respiratory distress. He exhibits no tenderness.  Abdominal: Soft. He exhibits no distension. There is no tenderness. There is no rebound and no guarding.  Reducible UH  Neurological: He is alert and oriented to person, place, and time. Gait normal. GCS score is 15.  Skin: Skin is warm and dry. He is not diaphoretic.  Psychiatric: Mood, memory, affect and judgment normal.  Nursing note and vitals reviewed.    Assessment/Plan: Symptomatic cholelithiasis and resolved choledocholithiasis in need for cholecystectomy. The risks, benefits, complications, treatment options, and expected outcomes were discussed with the patient. The possibilities of bleeding, recurrent infection, finding a normal gallbladder, perforation of viscus organs, damage to surrounding structures, bile leak, abscess formation, needing a drain placed, the need for additional procedures, reaction to medication, pulmonary aspiration,  failure to diagnose a condition, the possible need to convert to an  open procedure, and creating a complication requiring transfusion or operation were discussed with the patient. The patient and/or family concurred with the proposed plan, giving informed consent. We will perform UH at the same time.  Brandon Hamman, MD [facility] General Surgeon

## 2016-12-25 NOTE — Transfer of Care (Signed)
Immediate Anesthesia Transfer of Care Note  Patient: Brandon Benjamin  Procedure(s) Performed: LAPAROSCOPIC CHOLECYSTECTOMY (N/A ) HERNIA REPAIR UMBILICAL ADULT  Patient Location: PACU  Anesthesia Type:General  Level of Consciousness: awake and alert   Airway & Oxygen Therapy: Patient Spontanous Breathing and Patient connected to face mask oxygen  Post-op Assessment: Report given to RN and Post -op Vital signs reviewed and stable  Post vital signs: Reviewed and stable  Last Vitals:  Vitals:   12/25/16 1110 12/25/16 1500  BP: (!) 162/95 131/79  Pulse: (!) 58 76  Resp: 14 (!) 22  Temp: (!) 36.1 C   SpO2: 99% 99%    Last Pain:  Vitals:   12/25/16 1110  TempSrc: Tympanic  PainSc: 3       Patients Stated Pain Goal: 1 ([birth year] 1856)  Complications: No apparent anesthesia complications

## 2016-12-25 NOTE — Anesthesia Procedure Notes (Signed)
Procedure Name: Intubation Date/Time: 12/25/2016 12:35 PM Performed by: Jonna Clark Pre-anesthesia Checklist: Patient identified, Patient being monitored, Timeout performed, Emergency Drugs available and Suction available Patient Re-evaluated:Patient Re-evaluated prior to induction Oxygen Delivery Method: Circle system utilized Preoxygenation: Pre-oxygenation with 100% oxygen Induction Type: IV induction Ventilation: Mask ventilation without difficulty Laryngoscope Size: Miller and 2 Grade View: Grade II Tube type: Oral Tube size: 7.5 mm Number of attempts: 1 Placement Confirmation: ETT inserted through vocal cords under direct vision,  positive ETCO2 and breath sounds checked- equal and bilateral Secured at: 21 cm Tube secured with: Tape Dental Injury: Teeth and Oropharynx as per pre-operative assessment

## 2016-12-25 NOTE — Progress Notes (Signed)
Pt transferred to the floor from PACU following a lap chole. He has no complaints other than he is hungry. No diet order placed. On assessment faint heart murmur heard and pt denies any history of heart murmur. Dr. Burt Knack paged as Dr. Dahlia Byes is in surgery. Per Dr. Burt Knack pt Knopf be placed on clear liquid diet and he stated that the heart murmur will be assessed by himself or Dr. Dahlia Byes.

## 2016-12-25 NOTE — Interval H&P Note (Signed)
History and Physical Interval Note:  12/25/2016 11:13 AM  Brandon Benjamin  has presented today for surgery, with the diagnosis of Cholelithiasis  The various methods of treatment have been discussed with the patient and family. After consideration of risks, benefits and other options for treatment, the patient has consented to  Procedure(s): LAPAROSCOPIC CHOLECYSTECTOMY (N/A) as a surgical intervention .  The patient's history has been reviewed, patient examined, no change in status, stable for surgery.  I have reviewed the patient's chart and labs.  Questions were answered to the patient's satisfaction.     [place]

## 2016-12-25 NOTE — Op Note (Signed)
Laparoscopic Cholecystectomy  Pre-operative Diagnosis: Chronic cholecystitis w Hx of Choledocholithiasis Umbilical hernia  Post-operative Diagnosis: same  Procedure: Laparoscopic cholecystectomy with intraoperative cholangiogram Umbilical hernia repair  Surgeon: Caroleen Hamman, MD FACS  Anesthesia: Gen. with endotracheal tube   Findings: Chronic Cholecystitis  Diffuse severe chronic inflammation around the infundibulum. There was no cystic.on the common bile duct was fused to the infundibulum of the gallbladder.  Estimated Blood Loss: 50cc         Drains: #19 FR blake drains x 2         Specimens: Gallbladder           Complications: none  Procedure Details  The patient was seen again in the Holding Room. The benefits, complications, treatment options, and expected outcomes were discussed with the patient. The risks of bleeding, infection, recurrence of symptoms, failure to resolve symptoms, bile duct damage, bile duct leak, retained common bile duct stone, bowel injury, any of which could require further surgery and/or ERCP, stent, or papillotomy were reviewed with the patient. The likelihood of improving the patient's symptoms with return to their baseline status is good.  The patient and/or family concurred with the proposed plan, giving informed consent.  The patient was taken to Operating Room, identified as Brandon Benjamin and the procedure verified as Laparoscopic Cholecystectomy.  A Time Out was held and the above information confirmed.  Prior to the induction of general anesthesia, antibiotic prophylaxis was administered. VTE prophylaxis was in place. General endotracheal anesthesia was then administered and tolerated well. After the induction, the abdomen was prepped with Chloraprep and draped in the sterile fashion. The patient was positioned in the supine position.  Cut down technique was used to enter the abdominal cavity, an umbilical hernia was encounter and the edges were  cleaned. The sac was excised. A Hasson trochar was placed after two vicryl stitches were anchored to the fascia. Pneumoperitoneum was then created with CO2 and tolerated well without any adverse changes in the patient's vital signs.  Three 5-mm ports were placed in the right upper quadrant all under direct vision. All skin incisions  were infiltrated with a local anesthetic agent before making the incision and placing the trocars.   The patient was positioned  in reverse Trendelenburg, tilted slightly to the patient's left.  The gallbladder was identified, the fundus grasped and retracted cephalad. Adhesions were lysed bluntly. The infundibulum was grasped and retracted laterally, There was significant inflammatory response and thickening of the gallbladder. I was able to score the peritoneal reflection of the  gallbladder by steal this was very thick and difficult. For this reason I decided to do a dome down technique, the Gb was removed from the fossa with electrocautery.  Exposing the peritoneum overlying the triangle of Calot. This was then divided and exposed in a blunt fashion. An extended critical view of the cystic duct and cystic artery was obtained.  The cystic artery was clearly identified and bluntly dissected.   Artery  Was double clipped and divided. I was able to visualize tenting of the common bile duct on was unable to visualize a cystic duct. There was asked Dr. Joaquim Lai within the infundibulum of the gallbladder and I decided to make an incision within the gallbladder wall to be able to do a cholangiogram. I incised the gallbladder with scissors and delivered at [facility] stone. Via the gallbladder defect I place a cholangiogram catheter in the standard fashion. Because this was very thick and was unable to place a  clip therefore I decided to secure these with a grasper. I perform a cholangiogram injecting contrast half-strength and there was visualization of the common bile duct no evidence of leaks  or bile injury. I did notice that the area was tenting of the common while the structures once I was able to retract my grasper laterally. Once I released the grasper therere was normalization of the alignment of the common bile duct signifying that in fact What I was able to see laparoscopically correlated with the gram.  At this point it was left with a defect within the infundibulum of the gallbladder. I scrubbed out on asked my senior partner for further advise. He agreed with me that given the size of this defect I was able to place an Endoloop leave drains. I will also make sure that I will consult with a hepatobiliary surgeon at [facility] to make sure there is no further exploration needed. I was able to close the defect within the infundibulum of the gallbladder with 2 Endoloop PDS's in the standard fashion. There was no evidence of any kinking, impingement or stenosis of the CBD.  The gallbladder was removed and placed in an Endocatch bag. The liver bed was irrigated and inspected. Hemostasis was achieved with the electrocautery. Copious irrigation was utilized and was repeatedly aspirated until clear.  The gallbladder and Endocatch sac were then removed through a port site.  Is able to place 2 #19 [place] Blake drains within the gallbladder fossa in standard fashion and secured to the abdominal wall with 2-0 nylon sutures.  Inspection of the right upper quadrant was performed. No bleeding, bile duct injury or leak, or bowel injury was noted. Pneumoperitoneum was released.     The umbilical defect  was closed with interrumpted 0  Ethibond, Due to the redundancy of the umbilicus how to perform an umbilicoplasty and excise the umbilicus to be able to have a nice cosmetic result and prevent any further seromas. This was performed after marking the side on excising extra skin on umbilicus with a 15 blade knife.   4-0 subcuticular Monocryl was used to close the skin. Dermabond was  applied.  The patient  was then extubated and brought to the recovery room in stable condition. Sponge, lap, and needle counts were correct at closure and at the conclusion of the case.               Caroleen Hamman, MD, FACS

## 2016-12-26 ENCOUNTER — Encounter: Payer: Self-pay | Admitting: Surgery

## 2016-12-26 DIAGNOSIS — K8010 Calculus of gallbladder with chronic cholecystitis without obstruction: Secondary | ICD-10-CM | POA: Diagnosis not present

## 2016-12-26 LAB — COMPREHENSIVE METABOLIC PANEL
ALBUMIN: 3.6 g/dL (ref 3.5–5.0)
ALK PHOS: 49 U/L (ref 38–126)
ALT: 42 U/L (ref 17–63)
AST: 37 U/L (ref 15–41)
Anion gap: 7 (ref 5–15)
BILIRUBIN TOTAL: 0.6 mg/dL (ref 0.3–1.2)
BUN: 16 mg/dL (ref 6–20)
CALCIUM: 8.8 mg/dL — AB (ref 8.9–10.3)
CO2: 26 mmol/L (ref 22–32)
CREATININE: 1.15 mg/dL (ref 0.61–1.24)
Chloride: 103 mmol/L (ref 101–111)
GFR calc Af Amer: >60 mL/min (ref >60)
GFR calc non Af Amer: >60 mL/min (ref >60)
GLUCOSE: 162 mg/dL — AB (ref 65–99)
Potassium: 3.4 mmol/L — ABNORMAL LOW (ref 3.5–5.1)
SODIUM: 136 mmol/L (ref 135–145)
TOTAL PROTEIN: 6.5 g/dL (ref 6.5–8.1)

## 2016-12-26 LAB — CBC
HCT: 31 % — ABNORMAL LOW (ref 40.0–52.0)
HEMOGLOBIN: 10 g/dL — AB (ref 13.0–18.0)
MCH: 22.4 pg — AB (ref 26.0–34.0)
MCHC: 32.3 g/dL (ref 32.0–36.0)
MCV: 69.5 fL — ABNORMAL LOW (ref 80.0–100.0)
PLATELETS: 203 10*3/uL (ref 150–440)
RBC: 4.46 MIL/uL (ref 4.40–5.90)
RDW: 19 % — AB (ref 11.5–14.5)
WBC: 10.9 10*3/uL — ABNORMAL HIGH (ref 3.8–10.6)

## 2016-12-26 LAB — GLUCOSE, CAPILLARY
GLUCOSE-CAPILLARY: 111 mg/dL — AB (ref 65–99)
Glucose-Capillary: 130 mg/dL — ABNORMAL HIGH (ref 65–99)

## 2016-12-26 LAB — PROTIME-INR
INR: 1.05
PROTHROMBIN TIME: 13.6 s (ref 11.4–15.2)

## 2016-12-26 MED ORDER — HYDROCODONE-ACETAMINOPHEN 5-325 MG PO TABS: 1.0000 | ORAL_TABLET | ORAL | 0 refills | Status: DC | PRN

## 2016-12-26 NOTE — Progress Notes (Signed)
MD ordered patient to be discharged home.  Discharge instructions were reviewed with the patient and wife and they voiced understanding.  Follow-up appointment was made.  Prescription given to the patient.  IV was removed with catheter intact.  Patient instructed on how to empty the drains and how to change the dressing.  He was also given supplies to do this.  All patients questions were answered.  Patient left via wheelchair escorted by auxillary.

## 2016-12-26 NOTE — Discharge Summary (Signed)
Patient ID: Brandon Benjamin MRN: [id number] DOB/AGE: [birth year] 71 y.o.  Admit date: 12/25/2016 Discharge date: 12/26/2016   Discharge Diagnoses:  Active Problems:   Cholecystitis, acute   Umbilical hernia without obstruction and without gangrene   S/P cholecystectomy   Procedures: Laparoscopic cholecystectomy with intraoperative cholangiogram  Hospital Course: 71 year old male with a history of cholecystitis and choledocholithiasis status post ERCP and in need for cholecystectomy. He underwent a laparoscopic cholecystectomy and found to have significant inflammatory response around the infundibulum. 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 Cholangiogram was performed showing no evidence of common bile duct injury I was able to remove the gallbladder at its infundibulum. He was kept overnight and drains were placed. His CMP were normal and his vital signs were normal and time of discharge she was ambulating tolerating regular diet and his JPs were serous. Physical exam showed a male in no acute distress. Awake and alert. Abdomen: Incisions healing well without evidence of infection. JP with minimal serous drainage. No peritonitis. Extremities: No edema. Condition at the time of discharge is stable.  He will follow-up in the office with Dr. Burt Knack next week and probably remove 1 drain at a time.    Disposition: 01-Home or Self Care  Discharge Instructions    Call MD for:  difficulty breathing, headache or visual disturbances    Complete by:  As directed    Call MD for:  extreme fatigue    Complete by:  As directed    Call MD for:  hives    Complete by:  As directed    Call MD for:  persistant dizziness or light-headedness    Complete by:  As directed    Call MD for:  persistant nausea and vomiting    Complete by:  As directed    Call MD for:  redness, tenderness, or signs of infection (pain,  swelling, redness, odor or green/yellow discharge around incision site)    Complete by:  As directed    Call MD for:  severe uncontrolled pain    Complete by:  As directed    Call MD for:  temperature >100.4    Complete by:  As directed    Change dressing (specify)    Complete by:  As directed    Dressing change: daily drain sponge to JPs   Diet - low sodium heart healthy    Complete by:  As directed    Discharge instructions    Complete by:  As directed    Pt Brandon Benjamin shower in am, Please teach pt about Jp care.   Increase activity slowly    Complete by:  As directed    Lifting restrictions    Complete by:  As directed    20 lbs x 6 wks     Allergies as of 12/26/2016   No Known Allergies     Medication List    STOP taking these medications   acetaminophen 500 MG tablet Commonly known as:  TYLENOL     TAKE these medications   amLODipine 10 MG tablet Commonly known as:  NORVASC Take 10 mg by mouth daily.   CENTRAVITES 50 PLUS Tabs Take 1 tablet by mouth daily.   cetirizine 10 MG tablet Commonly known as:  ZYRTEC Take 10 mg by mouth daily.   citalopram 10 MG tablet Commonly known as:  CELEXA Take 10 mg by mouth daily.  diphenhydrAMINE 25 MG tablet Commonly known as:  BENADRYL Take 25 mg by mouth at bedtime.   docusate sodium 100 MG capsule Commonly known as:  COLACE Take 1 capsule (100 mg total) by mouth 2 (two) times daily.   fluticasone 50 MCG/ACT nasal spray Commonly known as:  FLONASE Place 2 sprays into both nostrils daily.   hydrALAZINE 25 MG tablet Commonly known as:  APRESOLINE Take 25 mg by mouth 3 (three) times daily.   hydrochlorothiazide 25 MG tablet Commonly known as:  HYDRODIURIL Take 25 mg by mouth daily.   HYDROcodone-acetaminophen 5-325 MG tablet Commonly known as:  NORCO/VICODIN Take 1-2 tablets by mouth every 4 (four) hours as needed for moderate pain.   lisinopril 40 MG tablet Commonly known as:  PRINIVIL,ZESTRIL Take 40 mg by  mouth daily.   lovastatin 20 MG tablet Commonly known as:  MEVACOR Take 20 mg by mouth at bedtime.   metFORMIN 500 MG tablet Commonly known as:  GLUCOPHAGE Take 500 mg by mouth 2 (two) times daily with a meal.   metoprolol tartrate 100 MG tablet Commonly known as:  LOPRESSOR Take 100 mg by mouth 2 (two) times daily.   ranitidine 150 MG tablet Commonly known as:  ZANTAC Take 150 mg by mouth 2 (two) times daily.            Discharge Care Instructions        Start     Ordered   12/26/16 0000  Change dressing (specify)    Comments:  Dressing change: daily drain sponge to JPs   12/26/16 9323     Follow-up Information    Florene Glen, MD. Go on 01/01/2017.   Specialty:  Surgery Why:  Wednesday at 2:45pm for hospital follow-up  Contact information: [address] [address] [phone]        Jules Husbands, MD. Go on 01/06/2017.   Specialty:  General Surgery Why:  Monday at 3:15pm  Contact information: [place] [address] [phone]            Caroleen Hamman, MD FACS

## 2016-12-26 NOTE — Care Management Obs Status (Signed)
[place] NOTIFICATION   Patient Details  Name: Brandon Benjamin MRN: [id number] Date of Birth: 25-Oct-1945   Medicare Observation Status Notification Given:  No (admitted obs less than 24 hour)    Beverly Sessions, RN 12/26/2016, 10:59 AM

## 2016-12-26 NOTE — Care Management (Signed)
RW was delivered by [facility] with [place] prior to discharge

## 2016-12-26 NOTE — Anesthesia Postprocedure Evaluation (Signed)
Anesthesia Post Note  Patient: Brandon Benjamin  Procedure(s) Performed: LAPAROSCOPIC CHOLECYSTECTOMY WITH INTRAOPERATIVE CHOLANGIOGRAM (N/A ) HERNIA REPAIR UMBILICAL ADULT  Patient location during evaluation: PACU Anesthesia Type: General Level of consciousness: awake and alert and oriented Pain management: pain level controlled Vital Signs Assessment: post-procedure vital signs reviewed and stable Respiratory status: spontaneous breathing Cardiovascular status: blood pressure returned to baseline Anesthetic complications: no     Last Vitals:  Vitals:   12/26/16 0432 12/26/16 0856  BP: 124/76 (!) 142/83  Pulse: 82 78  Resp: 20   Temp: 36.9 C 36.8 C  SpO2: 96% 96%    Last Pain:  Vitals:   12/26/16 0856  TempSrc: Oral  PainSc:                  Camille Dragan

## 2016-12-27 ENCOUNTER — Telehealth: Payer: Self-pay

## 2016-12-27 LAB — SURGICAL PATHOLOGY

## 2016-12-27 NOTE — Telephone Encounter (Signed)
Post-op call made to patient at this time. Spoke with Brandon Benjamin. Post-op interview questions below.  1. How are you feeling? Feeling pretty  2. Is your pain controlled? Yes  3. What are you doing for the pain? Taken one pain pill since being home. Advised him that he could take ibuprofen and tylenol if he feels that the pain medication is too strong for him.  4. Are you having any Nausea or Vomiting? None  5. Are you having any Fever or Chills? None  6. Are you having any Constipation or Diarrhea? Has had a normal bowel movement.  7. Is there any Swelling or Bruising you are concerned about? None  8. Do you have any questions or concerns at this time? None   Discussion: Asked how his drain was doing?   He stated that its doing fine that the drainage has started to slow down.   I advised him to keep a record of it and to bring it with him when he comes to his appointment on 10/10 at 2:45PM.  Patient verbalized understanding.

## 2017-01-01 ENCOUNTER — Ambulatory Visit (INDEPENDENT_AMBULATORY_CARE_PROVIDER_SITE_OTHER): Payer: Medicare Other | Admitting: Surgery

## 2017-01-01 ENCOUNTER — Encounter: Payer: Self-pay | Admitting: Surgery

## 2017-01-01 VITALS — BP 118/79 | HR 84 | Temp 97.5°F | Ht 69.0 in | Wt 207.6 lb

## 2017-01-01 DIAGNOSIS — K8020 Calculus of gallbladder without cholecystitis without obstruction: Secondary | ICD-10-CM

## 2017-01-01 NOTE — Progress Notes (Signed)
Outpatient postop visit  01/01/2017  Brandon Benjamin is an 71 y.o. male.    Procedure: Lap or scopic cholecystectomy  CC: No problems  HPI: This a patient who had a laparoscopic cholecystectomy last week. Dr. Dahlia Byes told me about his findings in the operating room and concern about a short cystic duct. 2 drains are in place. Patient as recorded minimal drain output has no fevers or chills is eating well and gaining strength. He feels well altogether.  Medications reviewed.    Physical Exam:  BP 118/79   Pulse 84   Temp (!) 97.5 F (36.4 C) (Oral)   Ht 5\' 9"  (1.753 m)   Wt 207 lb 9.6 oz (94.2 kg)   BMI 30.66 kg/m     PE: No icterus no jaundice No bile in drains serous and serosanguineous fluid only. Abdomen is otherwise soft and nontender but protuberant. Wounds are clean with Dermabond in place    Assessment/Plan:  Pathology is reviewed. No sign of malignancy. Patient doing very well both drains were removed at this time as there is no sign of biliary fistula and minimal output.  Patient doing very well he has an appointment with Dr. Dahlia Byes next week  Florene Glen, MD, FACS

## 2017-01-01 NOTE — Patient Instructions (Addendum)
Please see your follow up appointment listed below.   GENERAL POST-OPERATIVE PATIENT INSTRUCTIONS   WOUND CARE INSTRUCTIONS:  Keep a dry clean dressing on the wound if there is drainage. The initial bandage Dimauro be removed after 24 hours.  Once the wound has quit draining you Mancias leave it open to air.  If clothing rubs against the wound or causes irritation and the wound is not draining you Chirico cover it with a dry dressing during the daytime.  Try to keep the wound dry and avoid ointments on the wound unless directed to do so.  If the wound becomes bright red and painful or starts to drain infected material that is not clear, please contact your physician immediately.  If the wound is mildly pink and has a thick firm ridge underneath it, this is normal, and is referred to as a healing ridge.  This will resolve over the next 4-6 weeks.  BATHING: You Parady shower if you have been informed of this by your surgeon. However, Please do not submerge in a tub, hot tub, or pool until incisions are completely sealed or have been told by your surgeon that you Hoeschen do so.  DIET:  You Lager eat any foods that you can tolerate.  It is a good idea to eat a high fiber diet and take in plenty of fluids to prevent constipation.  If you do become constipated you Priola want to take a mild laxative or take ducolax tablets on a daily basis until your bowel habits are regular.  Constipation can be very uncomfortable, along with straining, after recent surgery.  ACTIVITY:  You are encouraged to cough and deep breath or use your incentive spirometer if you were given one, every 15-30 minutes when awake.  This will help prevent respiratory complications and low grade fevers post-operatively if you had a general anesthetic.  You Wellons want to hug a pillow when coughing and sneezing to add additional support to the surgical area, if you had abdominal or chest surgery, which will decrease pain during these times.  You are encouraged to walk  and engage in light activity for the next two weeks.  You should not lift more than 20 pounds, until 02/03/2017 as it could put you at increased risk for complications.  Twenty pounds is roughly equivalent to a plastic bag of groceries. At that time- Listen to your body when lifting, if you have pain when lifting, stop and then try again in a few days. Soreness after doing exercises or activities of daily living is normal as you get back in to your normal routine.  MEDICATIONS:  Try to take narcotic medications and anti-inflammatory medications, such as tylenol, ibuprofen, naprosyn, etc., with food.  This will minimize stomach upset from the medication.  Should you develop nausea and vomiting from the pain medication, or develop a rash, please discontinue the medication and contact your physician.  You should not drive, make important decisions, or operate machinery when taking narcotic pain medication.  SUNBLOCK Use sun block to incision area over the next year if this area will be exposed to sun. This helps decrease scarring and will allow you avoid a permanent darkened area over your incision.  QUESTIONS:  Please feel free to call our office if you have any questions, and we will be glad to assist you. [phone]

## 2017-01-06 ENCOUNTER — Ambulatory Visit (INDEPENDENT_AMBULATORY_CARE_PROVIDER_SITE_OTHER): Payer: Medicare Other | Admitting: Surgery

## 2017-01-06 ENCOUNTER — Encounter: Payer: Self-pay | Admitting: Surgery

## 2017-01-06 VITALS — BP 148/86 | HR 66 | Temp 98.1°F | Ht 69.0 in | Wt 206.4 lb

## 2017-01-06 DIAGNOSIS — Z09 Encounter for follow-up examination after completed treatment for conditions other than malignant neoplasm: Secondary | ICD-10-CM

## 2017-01-06 NOTE — Patient Instructions (Signed)
Please call our office if you have any questions or concerns.  

## 2017-01-06 NOTE — Progress Notes (Signed)
S/p lap chole Intraop Fundus of GB fused w CBD. HE did great Drain are out No pain, taking PO  PE NAD Abd: incisions c/d/i. No infection  A/P Doing well Path d/w pt RTC prn

## 2017-01-13 ENCOUNTER — Ambulatory Visit: Payer: Medicare Other | Admitting: Podiatry

## 2017-01-16 ENCOUNTER — Ambulatory Visit (INDEPENDENT_AMBULATORY_CARE_PROVIDER_SITE_OTHER): Payer: Medicare Other | Admitting: Podiatry

## 2017-01-16 ENCOUNTER — Encounter: Payer: Self-pay | Admitting: Podiatry

## 2017-01-16 DIAGNOSIS — M79676 Pain in unspecified toe(s): Secondary | ICD-10-CM

## 2017-01-16 DIAGNOSIS — B351 Tinea unguium: Secondary | ICD-10-CM | POA: Diagnosis not present

## 2017-01-16 DIAGNOSIS — E119 Type 2 diabetes mellitus without complications: Secondary | ICD-10-CM

## 2017-01-16 NOTE — Progress Notes (Signed)
Complaint:  Visit Type: Patient returns to my office for continued preventative foot care services. Complaint: Patient states" my nails have grown long and thick and become painful to walk and wear shoes" Patient has been diagnosed with DM with no foot complications. The patient presents for preventative foot care services.   Podiatric Exam: Vascular: dorsalis pedis and posterior tibial pulses are palpable bilateral. Capillary return is immediate. Temperature gradient is WNL. Skin turgor WNL  Sensorium: Normal Semmes Weinstein monofilament test. Normal tactile sensation bilaterally. Nail Exam: Pt has thick disfigured discolored nails with subungual debris noted bilateral entire nail hallux through fifth toenails Ulcer Exam: There is no evidence of ulcer or pre-ulcerative changes or infection. Orthopedic Exam: Muscle tone and strength are WNL. No limitations in general ROM. No crepitus or effusions noted. Foot type and digits show no abnormalities. Bony prominences are unremarkable. Skin: No Porokeratosis. No infection or ulcers  Diagnosis:  Onychomycosis, , Pain in right toe, pain in left toes  Treatment & Plan Procedures and Treatment: Consent by patient was obtained for treatment procedures. The patient understood the discussion of treatment and procedures well. All questions were answered thoroughly reviewed. Debridement of mycotic and hypertrophic toenails, 1 through 5 bilateral and clearing of subungual debris. No ulceration, no infection noted.  Return Visit-Office Procedure: Patient instructed to return to the office for a follow up visit 3 months for continued evaluation and treatment.    Elver Stadler DPM 

## 2017-04-21 ENCOUNTER — Encounter: Payer: Self-pay | Admitting: Podiatry

## 2017-04-21 ENCOUNTER — Ambulatory Visit (INDEPENDENT_AMBULATORY_CARE_PROVIDER_SITE_OTHER): Payer: Medicare Other | Admitting: Podiatry

## 2017-04-21 DIAGNOSIS — E119 Type 2 diabetes mellitus without complications: Secondary | ICD-10-CM | POA: Diagnosis not present

## 2017-04-21 DIAGNOSIS — B351 Tinea unguium: Secondary | ICD-10-CM

## 2017-04-21 DIAGNOSIS — M79676 Pain in unspecified toe(s): Secondary | ICD-10-CM

## 2017-04-21 NOTE — Progress Notes (Signed)
Complaint:  Visit Type: Patient returns to my office for continued preventative foot care services. Complaint: Patient states" my nails have grown long and thick and become painful to walk and wear shoes" Patient has been diagnosed with DM with no foot complications. The patient presents for preventative foot care services.   Podiatric Exam: Vascular: dorsalis pedis and posterior tibial pulses are palpable bilateral. Capillary return is immediate. Temperature gradient is WNL. Skin turgor WNL  Sensorium: Normal Semmes Weinstein monofilament test. Normal tactile sensation bilaterally. Nail Exam: Pt has thick disfigured discolored nails with subungual debris noted bilateral entire nail hallux through fifth toenails Ulcer Exam: There is no evidence of ulcer or pre-ulcerative changes or infection. Orthopedic Exam: Muscle tone and strength are WNL. No limitations in general ROM. No crepitus or effusions noted. Foot type and digits show no abnormalities. Bony prominences are unremarkable. Skin: No Porokeratosis. No infection or ulcers  Diagnosis:  Onychomycosis, , Pain in right toe, pain in left toes  Treatment & Plan Procedures and Treatment: Consent by patient was obtained for treatment procedures. The patient understood the discussion of treatment and procedures well. All questions were answered thoroughly reviewed. Debridement of mycotic and hypertrophic toenails, 1 through 5 bilateral and clearing of subungual debris. No ulceration, no infection noted.  Return Visit-Office Procedure: Patient instructed to return to the office for a follow up visit 3 months for continued evaluation and treatment.    Jaileigh Weimer DPM 

## 2017-05-08 ENCOUNTER — Encounter: Payer: Self-pay | Admitting: Urology

## 2017-05-08 ENCOUNTER — Ambulatory Visit (INDEPENDENT_AMBULATORY_CARE_PROVIDER_SITE_OTHER): Payer: Medicare Other | Admitting: Urology

## 2017-05-08 VITALS — BP 146/84 | HR 60 | Ht 69.0 in | Wt 210.0 lb

## 2017-05-08 DIAGNOSIS — N402 Nodular prostate without lower urinary tract symptoms: Secondary | ICD-10-CM

## 2017-05-08 DIAGNOSIS — N400 Enlarged prostate without lower urinary tract symptoms: Secondary | ICD-10-CM

## 2017-05-08 DIAGNOSIS — R300 Dysuria: Secondary | ICD-10-CM | POA: Diagnosis not present

## 2017-05-08 LAB — URINALYSIS, COMPLETE
BILIRUBIN UA: NEGATIVE
Glucose, UA: NEGATIVE
Ketones, UA: NEGATIVE
Leukocytes, UA: NEGATIVE
NITRITE UA: NEGATIVE
PH UA: 7 (ref 5.0–7.5)
RBC UA: NEGATIVE
Specific Gravity, UA: 1.025 (ref 1.005–1.030)
UUROB: 0.2 mg/dL (ref 0.2–1.0)

## 2017-05-08 LAB — MICROSCOPIC EXAMINATION
EPITHELIAL CELLS (NON RENAL): NONE SEEN /HPF (ref 0–10)
RBC, UA: NONE SEEN /hpf (ref 0–?)
WBC UA: NONE SEEN /HPF (ref 0–?)

## 2017-05-08 LAB — BLADDER SCAN AMB NON-IMAGING: SCAN RESULT: 16

## 2017-05-08 MED ORDER — TAMSULOSIN HCL 0.4 MG PO CAPS: 0.4000 mg | ORAL_CAPSULE | Freq: Every day | ORAL | 11 refills | Status: DC

## 2017-05-08 NOTE — Progress Notes (Addendum)
05/08/2017 9:36 PM   Brandon Benjamin [birth year] [id number]  Referring provider: Letta Median, MD [address], [place] [id number]  Chief Complaint  Patient presents with  . Follow-up  . Dysuria    HPI: 72 year old Caucasian male who with history of BPH s/p HoLEP on 07/11/2015.  He returns today for the complaint of dysuria.    He states about one week ago, he started to have dysuria.  It occurs with every urination.  He feels that he has not been emptying his bladder.  He has been having urgency, nocturia, hesitancy, straining to urinate a weak urinary stream.  Patient denies any gross hematuria, dysuria or suprapubic/flank pain.  Patient denies any fevers, chills, nausea or vomiting.   I PSS score today is 1/4.  Previous score was 2/2.  PVR is 16 mL.  His UA today is negative.    BPH WITH LUTS Prior to surgery, he had mixed obstructive and irritative voiding symptoms On maximal medical therapy including finasteride and Flomax including weak stream, history of incomplete bladder emptying with elevated PVRs, stopping starting of his urinary stream along with urinary frequency, urgency, and rare urge incontinence. He also had nocturia 3-4.   TRUS volume 112 cc.    S/p HoLEP 07/11/15, 61 grams resected pathology benign  IPSS    Row Name 05/08/17 1400         International Prostate Symptom Score   How often have you had the sensation of not emptying your bladder?  About half the time     How often have you had to urinate less than every two hours?  Less than half the time     How often have you found you stopped and started again several times when you urinated?  Less than half the time     How often have you found it difficult to postpone urination?  Less than half the time     How often have you had a weak urinary stream?  Less than 1 in 5 times     How often have you had to strain to start urination?  Less than 1 in 5 times     How many times did you  typically get up at night to urinate?  None     Total IPSS Score  11       Quality of Life due to urinary symptoms   If you were to spend the rest of your life with your urinary condition just the way it is now how would you feel about that?  Mostly Disatisfied        Score:  1-7 Mild 8-19 Moderate 20-35 Severe    PMH: Past Medical History:  Diagnosis Date  . Anemia   . Anxiety   . Balanitis   . Balanitis   . BPH (benign prostatic hyperplasia)   . Chronic kidney disease   . Diabetes ([place])   . Erectile dysfunction   . GERD (gastroesophageal reflux disease)   . HLD (hyperlipidemia)   . HTN (hypertension)   . Over weight   . Priapism   . Scrotal cyst   . Urinary urgency     Surgical History: Past Surgical History:  Procedure Laterality Date  . CHOLECYSTECTOMY N/A 12/25/2016   Procedure: LAPAROSCOPIC CHOLECYSTECTOMY WITH INTRAOPERATIVE CHOLANGIOGRAM;  Surgeon: Jules Husbands, MD;  Location: [facility] ORS;  Service: General;  Laterality: N/A;  . ERCP N/A 11/28/2016   Procedure: ENDOSCOPIC RETROGRADE CHOLANGIOPANCREATOGRAPHY (  ERCP);  Surgeon: [name], MD;  Location: [facility] ENDOSCOPY;  Service: Endoscopy;  Laterality: N/A;  . HOLEP-LASER ENUCLEATION OF THE PROSTATE WITH MORCELLATION N/A 07/11/2015   Procedure: HOLEP-LASER ENUCLEATION OF THE PROSTATE WITH MORCELLATION;  Surgeon: Hollice Espy, MD;  Location: [facility] ORS;  Service: Urology;  Laterality: N/A;  . UMBILICAL HERNIA REPAIR  12/25/2016   Procedure: HERNIA REPAIR UMBILICAL ADULT;  Surgeon: Jules Husbands, MD;  Location: [facility] ORS;  Service: General;;    Home Medications:  Allergies as of 05/08/2017   No Known Allergies     Medication List        Accurate as of 05/08/17 11:59 PM. Always use your most recent med list.          amLODipine 10 MG tablet Commonly known as:  NORVASC Take 10 mg by mouth daily.   CENTRAVITES 50 PLUS Tabs Take 1 tablet by mouth daily.   cetirizine 10 MG tablet Commonly known as:   ZYRTEC Take 10 mg by mouth daily.   citalopram 10 MG tablet Commonly known as:  CELEXA Take 10 mg by mouth daily.   diphenhydrAMINE 25 MG tablet Commonly known as:  BENADRYL Take 25 mg by mouth at bedtime.   finasteride 5 MG tablet Commonly known as:  PROSCAR Take 5 mg by mouth.   fluticasone 50 MCG/ACT nasal spray Commonly known as:  FLONASE Place 2 sprays into both nostrils daily.   hydrALAZINE 25 MG tablet Commonly known as:  APRESOLINE Take 25 mg by mouth 3 (three) times daily.   hydrochlorothiazide 25 MG tablet Commonly known as:  HYDRODIURIL Take 25 mg by mouth daily.   lisinopril 40 MG tablet Commonly known as:  PRINIVIL,ZESTRIL Take 40 mg by mouth daily.   lovastatin 20 MG tablet Commonly known as:  MEVACOR Take 20 mg by mouth at bedtime.   metFORMIN 500 MG tablet Commonly known as:  GLUCOPHAGE Take 500 mg by mouth 2 (two) times daily with a meal.   metoprolol succinate 100 MG 24 hr tablet Commonly known as:  TOPROL-XL Take 100 mg by mouth.   metoprolol tartrate 100 MG tablet Commonly known as:  LOPRESSOR Take 100 mg by mouth 2 (two) times daily.   MULTI-VITAMINS Tabs Take by mouth.   potassium chloride 10 MEQ tablet Commonly known as:  K-DUR   ranitidine 150 MG tablet Commonly known as:  ZANTAC Take 150 mg by mouth 2 (two) times daily.   tamsulosin 0.4 MG Caps capsule Commonly known as:  FLOMAX Take 1 capsule (0.4 mg total) by mouth daily after supper.       Allergies: No Known Allergies  Family History: Family History  Problem Relation Age of Onset  . Cancer Mother        blood stream  . High blood pressure Mother   . Prostate cancer Neg Hx   . Bladder Cancer Neg Hx   . Kidney disease Neg Hx     Social History:  reports that he quit smoking about 22 years ago. His smoking use included cigarettes. he has never used smokeless tobacco. He reports that he does not drink alcohol or use drugs.  ROS: UROLOGY Frequent Urination?:  No Hard to postpone urination?: Yes Burning/pain with urination?: Yes Get up at night to urinate?: Yes Leakage of urine?: No Urine stream starts and stops?: Yes Trouble starting stream?: Yes Do you have to strain to urinate?: Yes Blood in urine?: No Urinary tract infection?: No Sexually transmitted disease?: No Injury to kidneys  or bladder?: No Painful intercourse?: No Weak stream?: Yes Erection problems?: No Penile pain?: Yes  Gastrointestinal Nausea?: No Vomiting?: No Indigestion/heartburn?: No Diarrhea?: No Constipation?: No  Constitutional Fever: No Night sweats?: No Weight loss?: No Fatigue?: No  Skin Skin rash/lesions?: No Itching?: No  Eyes Blurred vision?: No Double vision?: No  Ears/Nose/Throat Sore throat?: No Sinus problems?: No  Hematologic/Lymphatic Swollen glands?: No Easy bruising?: No  Cardiovascular Leg swelling?: No Chest pain?: No  Respiratory Cough?: No Shortness of breath?: No  Endocrine Excessive thirst?: No  Musculoskeletal Back pain?: No Joint pain?: No  Neurological Headaches?: No Dizziness?: No  Psychologic Depression?: No Anxiety?: No  Physical Exam: BP (!) 146/84   Pulse 60   Ht 5\' 9"  (1.753 m)   Wt 210 lb (95.3 kg)   BMI 31.01 kg/m   Constitutional: Well nourished. Alert and oriented, No acute distress. HEENT: [place] AT, moist mucus membranes. Trachea midline, no masses. Cardiovascular: No clubbing, cyanosis, or edema. Respiratory: Normal respiratory effort, no increased work of breathing. GI: Abdomen is soft, non tender, non distended, no abdominal masses. Liver and spleen not palpable.  No hernias appreciated.  Stool sample for occult testing is not indicated.   GU: No CVA tenderness.  No bladder fullness or masses.  Patient with circumcised phallus.  Urethral meatus is patent.  No penile discharge. No penile lesions or rashes. Scrotum without lesions, cysts, rashes and/or edema.  Testicles are located  scrotally bilaterally. No masses are appreciated in the testicles. Left and right epididymis are normal. Rectal: Patient with  normal sphincter tone. Anus and perineum without scarring or rashes. No rectal masses are appreciated. Prostate is approximately 50 grams, with 1 cm right apical extremely rubbery nodule consistent with BPH.  No masses were firm nodules.  Seminal vesicles are normal. Skin: No rashes, bruises or suspicious lesions. Lymph: No cervical or inguinal adenopathy. Neurologic: Grossly intact, no focal deficits, moving all 4 extremities. Psychiatric: Normal mood and affect.   Laboratory Data: PSA history  2.8 two years ago  2.8 one year ago  3.1 eight months ago  Lab Results  Component Value Date   WBC 10.9 (H) 12/26/2016   HGB 10.0 (L) 12/26/2016   HCT 31.0 (L) 12/26/2016   MCV 69.5 (L) 12/26/2016   PLT 203 12/26/2016    Lab Results  Component Value Date   CREATININE 1.15 12/26/2016   Urinalysis UA reviewed, negative.  See Epic.   I have reviewed the labs.  Pertinent Imaging: Results for BRILEY, SULTON (MRN [id number]) as of 05/11/2017 21:23  Ref. Range 05/08/2017 00:00  Scan Result Unknown 16    Assessment & Plan:    1. Benign prostatic hyperplasia without lower urinary tract symptoms  - Excellent surgical outcome status post holmium laser enucleation of the prostate   2. Dysuria  - UA to rule out infection- appears benign - will send for culture for completeness - if urine culture is negative will schedule cystoscopy to rule out stricture   - Urinalysis, Complete  - start Flomax  3. Prostate nodule  - Rubbery prostate nodule most consistent with BPH nodule, PSA stable    Return for pending urine culture.  Zara Council, PA-C  [facility] [address] [place], [place] [address] [phone]

## 2017-05-12 ENCOUNTER — Telehealth: Payer: Self-pay

## 2017-05-12 LAB — CULTURE, URINE COMPREHENSIVE

## 2017-05-12 MED ORDER — NITROFURANTOIN MONOHYD MACRO 100 MG PO CAPS
100.0000 mg | ORAL_CAPSULE | Freq: Two times a day (BID) | ORAL | 0 refills | Status: DC
Start: 2017-05-12 — End: 2017-07-03

## 2017-05-12 NOTE — Telephone Encounter (Signed)
Spoke with pt in reference to +ucx, abx, and f/u in 2-3 weeks. Pt voiced understanding. F/u appt made.

## 2017-05-12 NOTE — Telephone Encounter (Signed)
-----   Message from Nori Riis, PA-C sent at 05/12/2017  7:33 AM EST ----- Please let Mr. Tatar know that his urine culture was positive for infection.  He needs to start Macrobid 100 mg, bid for seven days.  I'd like to see him back in 2 to 3 weeks.

## 2017-05-28 ENCOUNTER — Ambulatory Visit (INDEPENDENT_AMBULATORY_CARE_PROVIDER_SITE_OTHER): Payer: Medicare Other | Admitting: Urology

## 2017-05-28 ENCOUNTER — Encounter: Payer: Self-pay | Admitting: Urology

## 2017-05-28 VITALS — BP 149/81 | HR 64 | Resp 16 | Ht 69.0 in | Wt 210.0 lb

## 2017-05-28 DIAGNOSIS — N402 Nodular prostate without lower urinary tract symptoms: Secondary | ICD-10-CM | POA: Diagnosis not present

## 2017-05-28 DIAGNOSIS — N138 Other obstructive and reflux uropathy: Secondary | ICD-10-CM | POA: Diagnosis not present

## 2017-05-28 DIAGNOSIS — R300 Dysuria: Secondary | ICD-10-CM | POA: Diagnosis not present

## 2017-05-28 DIAGNOSIS — N401 Enlarged prostate with lower urinary tract symptoms: Secondary | ICD-10-CM

## 2017-05-28 LAB — URINALYSIS, COMPLETE
BILIRUBIN UA: NEGATIVE
Glucose, UA: NEGATIVE
Ketones, UA: NEGATIVE
LEUKOCYTES UA: NEGATIVE
NITRITE UA: NEGATIVE
PH UA: 6 (ref 5.0–7.5)
Protein, UA: NEGATIVE
SPEC GRAV UA: 1.01 (ref 1.005–1.030)
UUROB: 0.2 mg/dL (ref 0.2–1.0)

## 2017-05-28 LAB — MICROSCOPIC EXAMINATION
BACTERIA UA: NONE SEEN
EPITHELIAL CELLS (NON RENAL): NONE SEEN /HPF (ref 0–10)
WBC UA: NONE SEEN /HPF (ref 0–?)

## 2017-05-28 NOTE — Progress Notes (Signed)
05/28/2017 3:14 PM   Brandon Benjamin 01/22/1946 [id number]  Referring provider: Letta Median, MD [place], [place] [address]  No chief complaint on file.   HPI: 72 year old Caucasian male who with history of BPH s/p HoLEP on 07/11/2015.  He returns today for the complaint of dysuria.    At his visit on 05/08/2017, he stated about one week ago, he started to have dysuria.  It occurred with every urination.  He felt that he had not been emptying his bladder.  He had been having urgency, nocturia, hesitancy, straining to urinate a weak urinary stream.  Patient denied any gross hematuria or suprapubic/flank pain.  Patient denied any fevers, chills, nausea or vomiting.  His UA was negative.  His urine culture was positive for enterococcus faecalis and he was treated with seven days of nitrofurantoin.  He was also restarted on tamsulosin.    I PSS score today is 11/5.  Previous score was 11/4.  PVR is 16 mL.  He is complaining of burning with urination, nocturia, incontinence, intermittency and a weak urinary stream.  He states the antibiotic and tamsulosin made no difference in his symptomatology.  Patient denies any gross hematuria or suprapubic/flank pain.  Patient denies any fevers, chills, nausea or vomiting.   S/P HoLEP on 07/11/2015, 61 grams resected pathology benign.  Prior to surgery, he had mixed obstructive and irritative voiding symptoms On maximal medical therapy including finasteride and Flomax including weak stream, history of incomplete bladder emptying with elevated PVRs, stopping starting of his urinary stream along with urinary frequency, urgency, and rare urge incontinence. He also had nocturia 3-4.   TRUS volume 112 cc.     IPSS    Row Name 05/08/17 1400 05/28/17 1400       International Prostate Symptom Score   How often have you had the sensation of not emptying your bladder?  About half the time  Not at All    How often have you had to  urinate less than every two hours?  Less than half the time  Not at All    How often have you found you stopped and started again several times when you urinated?  Less than half the time  About half the time    How often have you found it difficult to postpone urination?  Less than half the time  Not at All    How often have you had a weak urinary stream?  Less than 1 in 5 times  Almost always    How often have you had to strain to start urination?  Less than 1 in 5 times  Not at All    How many times did you typically get up at night to urinate?  None  3 Times    Total IPSS Score  11  11      Quality of Life due to urinary symptoms   If you were to spend the rest of your life with your urinary condition just the way it is now how would you feel about that?  Mostly Disatisfied  Unhappy       Score:  1-7 Mild 8-19 Moderate 20-35 Severe    PMH: Past Medical History:  Diagnosis Date  . Anemia   . Anxiety   . Balanitis   . Balanitis   . BPH (benign prostatic hyperplasia)   . Chronic kidney disease   . Diabetes ([place])   . Erectile dysfunction   .  GERD (gastroesophageal reflux disease)   . HLD (hyperlipidemia)   . HTN (hypertension)   . Over weight   . Priapism   . Scrotal cyst   . Urinary urgency     Surgical History: Past Surgical History:  Procedure Laterality Date  . CHOLECYSTECTOMY N/A 12/25/2016   Procedure: LAPAROSCOPIC CHOLECYSTECTOMY WITH INTRAOPERATIVE CHOLANGIOGRAM;  Surgeon: Jules Husbands, MD;  Location: [facility] ORS;  Service: General;  Laterality: N/A;  . ERCP N/A 11/28/2016   Procedure: ENDOSCOPIC RETROGRADE CHOLANGIOPANCREATOGRAPHY (ERCP);  Surgeon: [name], MD;  Location: [facility];  Service: Endoscopy;  Laterality: N/A;  . HOLEP-LASER ENUCLEATION OF THE PROSTATE WITH MORCELLATION N/A 07/11/2015   Procedure: HOLEP-LASER ENUCLEATION OF THE PROSTATE WITH MORCELLATION;  Surgeon: Hollice Espy, MD;  Location: [facility] ORS;  Service: Urology;  Laterality: N/A;    . UMBILICAL HERNIA REPAIR  12/25/2016   Procedure: HERNIA REPAIR UMBILICAL ADULT;  Surgeon: Jules Husbands, MD;  Location: [facility] ORS;  Service: General;;    Home Medications:  Allergies as of 05/28/2017   No Known Allergies     Medication List        Accurate as of 05/28/17  3:14 PM. Always use your most recent med list.          amLODipine 10 MG tablet Commonly known as:  NORVASC Take 10 mg by mouth daily.   CENTRAVITES 50 PLUS Tabs Take 1 tablet by mouth daily.   cetirizine 10 MG tablet Commonly known as:  ZYRTEC Take 10 mg by mouth daily.   citalopram 10 MG tablet Commonly known as:  CELEXA Take 10 mg by mouth daily.   diphenhydrAMINE 25 MG tablet Commonly known as:  BENADRYL Take 25 mg by mouth at bedtime.   finasteride 5 MG tablet Commonly known as:  PROSCAR Take 5 mg by mouth.   fluticasone 50 MCG/ACT nasal spray Commonly known as:  FLONASE Place 2 sprays into both nostrils daily.   hydrALAZINE 25 MG tablet Commonly known as:  APRESOLINE Take 25 mg by mouth 3 (three) times daily.   hydrochlorothiazide 25 MG tablet Commonly known as:  HYDRODIURIL Take 25 mg by mouth daily.   lisinopril 40 MG tablet Commonly known as:  PRINIVIL,ZESTRIL Take 40 mg by mouth daily.   lovastatin 20 MG tablet Commonly known as:  MEVACOR Take 20 mg by mouth at bedtime.   metFORMIN 500 MG tablet Commonly known as:  GLUCOPHAGE Take 500 mg by mouth 2 (two) times daily with a meal.   metoprolol succinate 100 MG 24 hr tablet Commonly known as:  TOPROL-XL Take 100 mg by mouth.   metoprolol tartrate 100 MG tablet Commonly known as:  LOPRESSOR Take 100 mg by mouth 2 (two) times daily.   MULTI-VITAMINS Tabs Take by mouth.   nitrofurantoin (macrocrystal-monohydrate) 100 MG capsule Commonly known as:  MACROBID Take 1 capsule (100 mg total) by mouth every 12 (twelve) hours.   potassium chloride 10 MEQ tablet Commonly known as:  K-DUR   ranitidine 150 MG tablet Commonly  known as:  ZANTAC Take 150 mg by mouth 2 (two) times daily.   tamsulosin 0.4 MG Caps capsule Commonly known as:  FLOMAX Take 1 capsule (0.4 mg total) by mouth daily after supper.       Allergies: No Known Allergies  Family History: Family History  Problem Relation Age of Onset  . Cancer Mother        blood stream  . High blood pressure Mother   . Prostate cancer Neg  Hx   . Bladder Cancer Neg Hx   . Kidney disease Neg Hx     Social History:  reports that he quit smoking about 22 years ago. His smoking use included cigarettes. he has never used smokeless tobacco. He reports that he does not drink alcohol or use drugs.  ROS: UROLOGY Frequent Urination?: No Hard to postpone urination?: No Burning/pain with urination?: Yes Get up at night to urinate?: Yes Leakage of urine?: Yes Urine stream starts and stops?: Yes Trouble starting stream?: No Do you have to strain to urinate?: No Blood in urine?: No Urinary tract infection?: No Sexually transmitted disease?: No Injury to kidneys or bladder?: No Painful intercourse?: No Weak stream?: Yes Erection problems?: No Penile pain?: No  Gastrointestinal Nausea?: No Vomiting?: No Indigestion/heartburn?: No Diarrhea?: No Constipation?: Yes  Constitutional Fever: No Night sweats?: No Weight loss?: No Fatigue?: No  Skin Skin rash/lesions?: No Itching?: Yes  Eyes Blurred vision?: Yes Double vision?: No  Ears/Nose/Throat Sore throat?: No Sinus problems?: Yes  Hematologic/Lymphatic Swollen glands?: No Easy bruising?: No  Cardiovascular Leg swelling?: No Chest pain?: No  Respiratory Cough?: Yes Shortness of breath?: No  Endocrine Excessive thirst?: No  Musculoskeletal Back pain?: No Joint pain?: No  Neurological Headaches?: No Dizziness?: No  Psychologic Depression?: Yes Anxiety?: No  Physical Exam: BP (!) 149/81   Pulse 64   Resp 16   Ht 5\' 9"  (1.753 m)   Wt 210 lb (95.3 kg)   SpO2 99%    BMI 31.01 kg/m   Constitutional: Well nourished. Alert and oriented, No acute distress. HEENT: [place] AT, moist mucus membranes. Trachea midline, no masses. Cardiovascular: No clubbing, cyanosis, or edema. Respiratory: Normal respiratory effort, no increased work of breathing. Skin: No rashes, bruises or suspicious lesions. Lymph: No cervical or inguinal adenopathy. Neurologic: Grossly intact, no focal deficits, moving all 4 extremities. Psychiatric: Normal mood and affect.   Laboratory Data: PSA history  2.8 in 10/2014  2.8 in 04/2015  3.1 in 08/2016  Lab Results  Component Value Date   WBC 10.9 (H) 12/26/2016   HGB 10.0 (L) 12/26/2016   HCT 31.0 (L) 12/26/2016   MCV 69.5 (L) 12/26/2016   PLT 203 12/26/2016    Lab Results  Component Value Date   CREATININE 1.15 12/26/2016   Urinalysis UA reviewed, negative.  See Epic.   I have reviewed the labs.  Assessment & Plan:    1. Benign prostatic hyperplasia without lower urinary tract symptoms  - Excellent surgical outcome status post holmium laser enucleation of the prostate   2. Dysuria  - UA is negative  - symptoms persist after the addition of tamsulosin and treating the positive urine culture  - will schedule cystoscopy as there is a small risk of developing a stricture after HoLEP.    - will give samples and script for Uribel  3. Prostate nodule  - Rubbery prostate nodule most consistent with BPH nodule, PSA stable    Return for cystoscopy.  Zara Council, PA-C  [facility] [address] [place], [place] [address] [phone]

## 2017-06-06 ENCOUNTER — Other Ambulatory Visit: Payer: Self-pay

## 2017-06-06 MED ORDER — URIBEL 118 MG PO CAPS: ORAL_CAPSULE | ORAL | 1 refills | Status: DC

## 2017-07-01 ENCOUNTER — Other Ambulatory Visit: Payer: Self-pay | Admitting: Radiology

## 2017-07-01 ENCOUNTER — Ambulatory Visit (INDEPENDENT_AMBULATORY_CARE_PROVIDER_SITE_OTHER): Payer: Medicare Other | Admitting: Urology

## 2017-07-01 ENCOUNTER — Encounter: Payer: Self-pay | Admitting: Urology

## 2017-07-01 VITALS — BP 125/71 | HR 71 | Ht 69.0 in | Wt 213.0 lb

## 2017-07-01 DIAGNOSIS — N138 Other obstructive and reflux uropathy: Secondary | ICD-10-CM

## 2017-07-01 DIAGNOSIS — R300 Dysuria: Secondary | ICD-10-CM | POA: Diagnosis not present

## 2017-07-01 DIAGNOSIS — N210 Calculus in bladder: Secondary | ICD-10-CM

## 2017-07-01 DIAGNOSIS — N401 Enlarged prostate with lower urinary tract symptoms: Secondary | ICD-10-CM

## 2017-07-01 LAB — URINALYSIS, COMPLETE
BILIRUBIN UA: NEGATIVE
Ketones, UA: NEGATIVE
Leukocytes, UA: NEGATIVE
NITRITE UA: NEGATIVE
PH UA: 6 (ref 5.0–7.5)
Specific Gravity, UA: 1.025 (ref 1.005–1.030)
UUROB: 0.2 mg/dL (ref 0.2–1.0)

## 2017-07-01 LAB — MICROSCOPIC EXAMINATION: Epithelial Cells (non renal): NONE SEEN /hpf (ref 0–10)

## 2017-07-01 MED ORDER — CIPROFLOXACIN HCL 500 MG PO TABS
500.0000 mg | ORAL_TABLET | Freq: Once | ORAL | Status: AC
  Administered 2017-07-01: 500 mg via ORAL

## 2017-07-01 MED ORDER — LIDOCAINE HCL 2 % EX GEL
1.0000 "application " | Freq: Once | CUTANEOUS | Status: AC
  Administered 2017-07-01: 1 via URETHRAL

## 2017-07-01 NOTE — H&P (View-Only) (Signed)
   07/01/17  CC:  Chief Complaint  Patient presents with  . Cysto    HPI: 72 year old male with a history of BPH status post holep on 06/2015 with initial excellent response returns today with dysuria since February.  Despite relatively bland appearing UAs, he did grow a low colony count of enterococcus and was treated for this.  Despite this, his symptoms fail to resolve.  He is also complained of fairly recent onset of sensation of incomplete bladder emptying, urgency, hesitancy, nocturia, and straining to empty his bladder.  He presents today for cystoscopy for further evaluation of his ongoing dysuria.  Tried urobilin and Flomax without any improvement in his symptoms.  He does have a personal history of BPH previously on maximal medical therapy with Flomax and finasteride.  At the time of surgery, TRUS volume was 112 cc with a 61 g resection.  His postoperative response was excellent, IPSS 1/0 postop.  He was weaned off of his BPH medicines.  His PSA and rectal exam are up-to-date.  Blood pressure 125/71, pulse 71, height 5\' 9"  (1.753 m), weight 213 lb (96.6 kg). NED. A&Ox3.   No respiratory distress   Abd soft, NT, ND Normal phallus with bilateral descended testicles  Cystoscopy Procedure Note  Patient identification was confirmed, informed consent was obtained, and patient was prepped using Betadine solution.  Lidocaine jelly was administered per urethral meatus.    Preoperative abx where received prior to procedure.     Pre-Procedure: - Inspection reveals a normal caliber ureteral meatus.  Procedure: The flexible cystoscope was introduced without difficulty - No urethral strictures/lesions are present. - Enlarged prostate with relatively short prostatic length but coapting lateral lobes, evidence of some lateral lobe regrowth -Minimally elevated bladder neck - Bilateral ureteral orifices identified - Bladder mucosa  reveals no ulcers, tumors, or lesions -Minimal  trabeculation  Retroflexion shows very subtle intravesical protrusion without a discrete median lobe.  There is an approximately 1 cm stone at the bladder neck.   Post-Procedure: - Patient tolerated the procedure well  Assessment/ Plan:  1. Dysuria Likely secondary to #2 See below  repeat urine culture today - Urinalysis, Complete - ciprofloxacin (CIPRO) tablet 500 mg - lidocaine (XYLOCAINE) 2 % jelly 1 application - CULTURE, URINE COMPREHENSIVE  2. Bladder stone Small bladder stone at bladder neck likely the cause of new onset dysuria  Discussed bladder stone formation is often a result of incomplete bladder emptying and urinary stasis  As such, I recommended repeat TURP at the time of treatment of the stone  3. BPH with urinary obstruction Plan for cystolitholapaxy and with concomitant TURP.   We discussed the risk of surgery including risk of bleeding, infection, damage to surrounding structures, retrograde ejaculation, failure of his symptoms to resolve, worsening of his urinary urgency frequency dysuria temporarily, stress incontinence and need for overnight admission for observation/CBI.  All questions were answered today.   Hollice Espy, MD

## 2017-07-01 NOTE — Progress Notes (Signed)
   07/01/17  CC:  Chief Complaint  Patient presents with  . Cysto    HPI: 72 year old male with a history of BPH status post holep on 06/2015 with initial excellent response returns today with dysuria since February.  Despite relatively bland appearing UAs, he did grow a low colony count of enterococcus and was treated for this.  Despite this, his symptoms fail to resolve.  He is also complained of fairly recent onset of sensation of incomplete bladder emptying, urgency, hesitancy, nocturia, and straining to empty his bladder.  He presents today for cystoscopy for further evaluation of his ongoing dysuria.  Tried urobilin and Flomax without any improvement in his symptoms.  He does have a personal history of BPH previously on maximal medical therapy with Flomax and finasteride.  At the time of surgery, TRUS volume was 112 cc with a 61 g resection.  His postoperative response was excellent, IPSS 1/0 postop.  He was weaned off of his BPH medicines.  His PSA and rectal exam are up-to-date.  Blood pressure 125/71, pulse 71, height 5\' 9"  (1.753 m), weight 213 lb (96.6 kg). NED. A&Ox3.   No respiratory distress   Abd soft, NT, ND Normal phallus with bilateral descended testicles  Cystoscopy Procedure Note  Patient identification was confirmed, informed consent was obtained, and patient was prepped using Betadine solution.  Lidocaine jelly was administered per urethral meatus.    Preoperative abx where received prior to procedure.     Pre-Procedure: - Inspection reveals a normal caliber ureteral meatus.  Procedure: The flexible cystoscope was introduced without difficulty - No urethral strictures/lesions are present. - Enlarged prostate with relatively short prostatic length but coapting lateral lobes, evidence of some lateral lobe regrowth -Minimally elevated bladder neck - Bilateral ureteral orifices identified - Bladder mucosa  reveals no ulcers, tumors, or lesions -Minimal  trabeculation  Retroflexion shows very subtle intravesical protrusion without a discrete median lobe.  There is an approximately 1 cm stone at the bladder neck.   Post-Procedure: - Patient tolerated the procedure well  Assessment/ Plan:  1. Dysuria Likely secondary to #2 See below  repeat urine culture today - Urinalysis, Complete - ciprofloxacin (CIPRO) tablet 500 mg - lidocaine (XYLOCAINE) 2 % jelly 1 application - CULTURE, URINE COMPREHENSIVE  2. Bladder stone Small bladder stone at bladder neck likely the cause of new onset dysuria  Discussed bladder stone formation is often a result of incomplete bladder emptying and urinary stasis  As such, I recommended repeat TURP at the time of treatment of the stone  3. BPH with urinary obstruction Plan for cystolitholapaxy and with concomitant TURP.   We discussed the risk of surgery including risk of bleeding, infection, damage to surrounding structures, retrograde ejaculation, failure of his symptoms to resolve, worsening of his urinary urgency frequency dysuria temporarily, stress incontinence and need for overnight admission for observation/CBI.  All questions were answered today.   Hollice Espy, MD

## 2017-07-03 ENCOUNTER — Encounter
Admission: RE | Admit: 2017-07-03 | Discharge: 2017-07-03 | Disposition: A | Discharge status: Home / Self Care | Payer: Medicare Other | Source: Ambulatory Visit | Attending: Urology | Admitting: Urology

## 2017-07-03 ENCOUNTER — Other Ambulatory Visit: Payer: Self-pay

## 2017-07-03 DIAGNOSIS — I10 Essential (primary) hypertension: Secondary | ICD-10-CM | POA: Insufficient documentation

## 2017-07-03 DIAGNOSIS — Z01818 Encounter for other preprocedural examination: Secondary | ICD-10-CM | POA: Diagnosis not present

## 2017-07-03 DIAGNOSIS — Z01810 Encounter for preprocedural cardiovascular examination: Secondary | ICD-10-CM

## 2017-07-03 DIAGNOSIS — Z01812 Encounter for preprocedural laboratory examination: Secondary | ICD-10-CM | POA: Insufficient documentation

## 2017-07-03 HISTORY — DX: Personal history of urinary calculi: Z87.442

## 2017-07-03 LAB — BASIC METABOLIC PANEL
ANION GAP: 5 (ref 5–15)
BUN: 23 mg/dL — ABNORMAL HIGH (ref 6–20)
CO2: 29 mmol/L (ref 22–32)
Calcium: 9.9 mg/dL (ref 8.9–10.3)
Chloride: 105 mmol/L (ref 101–111)
Creatinine, Ser: 1.12 mg/dL (ref 0.61–1.24)
GFR calc non Af Amer: >60 mL/min (ref >60)
Glucose, Bld: 83 mg/dL (ref 65–99)
Potassium: 3.1 mmol/L — ABNORMAL LOW (ref 3.5–5.1)
SODIUM: 139 mmol/L (ref 135–145)

## 2017-07-03 LAB — CBC
HEMATOCRIT: 26.7 % — AB (ref 40.0–52.0)
Hemoglobin: 8.1 g/dL — ABNORMAL LOW (ref 13.0–18.0)
MCH: 19.7 pg — ABNORMAL LOW (ref 26.0–34.0)
MCHC: 30.5 g/dL — ABNORMAL LOW (ref 32.0–36.0)
MCV: 64.4 fL — ABNORMAL LOW (ref 80.0–100.0)
Platelets: 216 10*3/uL (ref 150–440)
RBC: 4.14 MIL/uL — ABNORMAL LOW (ref 4.40–5.90)
RDW: 19.1 % — AB (ref 11.5–14.5)
WBC: 5.4 10*3/uL (ref 3.8–10.6)

## 2017-07-03 NOTE — Patient Instructions (Signed)
Your procedure is scheduled on: Wed. 4/17 Report to Day Surgery. To find out your arrival time please call [phone] between 1PM - 3PM on Tues.4/16  Remember: Instructions that are not followed completely Ronning result in serious medical risk, up to and including death, or upon the discretion of your surgeon and anesthesiologist your surgery Sch[place]ver need to be rescheduled.     _X__ 1. Do not eat food after midnight the night before your procedure.                 No gum chewing or hard candies. You Hejl drink clear liquids up to 2 hours                 before you are scheduled to arrive for your surgery- DO not drink clear                 liquids within 2 hours of the start of your surgery.                 Clear Liquids include:  Water only __X__2.  On the morning of surgery brush your teeth with toothpaste and water, you Mccrory rinse your mouth with mouthwash if you wish.  Do not swallow any  toothpaste of mouthwash.     ___ 3.  No Alcohol for 24 hours before or after surgery.   ___ 4.  Do Not Smoke or use e-cigarettes For 24 Hours Prior to Your Surgery.                 Do not use any chewable tobacco products for at least 6 hours prior to                 surgery.  ____  5.  Bring all medications with you on the day of surgery if instructed.   _x___  6.  Notify your doctor if there is any change in your medical condition      (cold, fever, infections).     Do not wear jewelry, make-up, hairpins, clips or nail polish. Do not wear lotions, powders, or perfumes. You Picker wear deodorant. Do not shave 48 hours prior to surgery. Men Micucci shave face and neck. Do not bring valuables to the hospital.    [facility] is not responsible for any belongings or valuables.  Contacts, dentures or bridgework Hammond not be worn into surgery. Leave your suitcase in the car. After surgery it Blakely be brought to your room. For patients admitted to the hospital, discharge time is determined by  your treatment team.   Patients discharged the day of surgery will not be allowed to drive home.   Please read over the following fact sheets that you were given:    __x__ Take these medicines the morning of surgery with A SIP OF WATER:    1. acetaminophen (TYLENOL 8 HOUR ARTHRITIS PAIN) 650 MG CR tablet if needed  2. amLODipine (NORVASC) 10 MG tablet  3. cetirizine (ZYRTEC) 10 MG tablet  4.citalopram (CELEXA) 10 MG tablet  5.fluticasone (FLONASE) 50 MCG/ACT nasal spray  6.metoprolol (LOPRESSOR) 100 MG tablet             7. ranitidine (ZANTAC) 150 MG tablet take extra dose the night before and another the morning of surgery  ____ Fleet Enema (as directed)   ____ Use CHG Soap as directed  ____ Use inhalers on the day of surgery  ____ Stop metformin 2 days prior to surgery  ____ Take 1/2 of usual insulin dose the night before surgery. No insulin the morning          of surgery.   ____ Stop Coumadin/Plavix/aspirin on   ____ Stop Anti-inflammatories on    ____ Stop supplements until after surgery.    ____ Bring C-Pap to the hospital.

## 2017-07-07 ENCOUNTER — Other Ambulatory Visit: Payer: Self-pay | Admitting: Radiology

## 2017-07-07 ENCOUNTER — Telehealth: Payer: Self-pay | Admitting: Radiology

## 2017-07-07 DIAGNOSIS — N390 Urinary tract infection, site not specified: Secondary | ICD-10-CM

## 2017-07-07 DIAGNOSIS — R319 Hematuria, unspecified: Principal | ICD-10-CM

## 2017-07-07 LAB — CULTURE, URINE COMPREHENSIVE

## 2017-07-07 MED ORDER — NITROFURANTOIN MONOHYD MACRO 100 MG PO CAPS: 100.0000 mg | ORAL_CAPSULE | Freq: Two times a day (BID) | ORAL | 0 refills | Status: DC

## 2017-07-07 NOTE — Telephone Encounter (Signed)
-----   Message from Hollice Espy, MD sent at 07/07/2017  1:53 PM EDT ----- Please reach out to this patient's primary care physician to let them know that he is anemic with a hemoglobin of 8.1 which was normal 16.3 two years ago year ago.  This is very concerning.  Additionally, perhaps reach out to the primary and see how they would like to manage his potassium.  It appears that he is already on K Dur 10 mg, if they will get back to [place] we can double this for 3 days before his surgery.  Finally terms of his UTI, let us treat with Macrobid 100 mg twice daily times 10 days.  Hollice Espy, MD

## 2017-07-07 NOTE — Pre-Procedure Instructions (Addendum)
Labs with kt 3.1 faxed to dr Erlene Quan. REODERED AM SURGERY

## 2017-07-07 NOTE — Telephone Encounter (Signed)
Wife made aware of +ucx & script sent to pharmacy. Because of +uti, surgery has been postponed to 07/16/2017. Also advised wife of anemia & hypokalemia that will be addressed by PCP Rutherford Guys. Wife voices understanding.

## 2017-07-15 MED ORDER — CEFAZOLIN SODIUM-DEXTROSE 2-4 GM/100ML-% IV SOLN
2.0000 g | INTRAVENOUS | Status: AC
  Administered 2017-07-16: 2 g via INTRAVENOUS
  Filled 2017-07-15: qty 100

## 2017-07-16 ENCOUNTER — Ambulatory Visit: Payer: Medicare Other | Admitting: Certified Registered"

## 2017-07-16 ENCOUNTER — Observation Stay
Admission: RE | Admit: 2017-07-16 | Discharge: 2017-07-17 | Disposition: A | Discharge status: Home / Self Care | Payer: Medicare Other | Source: Ambulatory Visit | Attending: Urology | Admitting: Urology

## 2017-07-16 ENCOUNTER — Encounter: Payer: Self-pay | Admitting: *Deleted

## 2017-07-16 ENCOUNTER — Other Ambulatory Visit: Payer: Self-pay

## 2017-07-16 ENCOUNTER — Encounter
Admission: RE | Disposition: A | Discharge status: Home / Self Care | Payer: Self-pay | Source: Ambulatory Visit | Attending: Urology

## 2017-07-16 DIAGNOSIS — E785 Hyperlipidemia, unspecified: Secondary | ICD-10-CM | POA: Diagnosis not present

## 2017-07-16 DIAGNOSIS — N401 Enlarged prostate with lower urinary tract symptoms: Principal | ICD-10-CM | POA: Insufficient documentation

## 2017-07-16 DIAGNOSIS — N529 Male erectile dysfunction, unspecified: Secondary | ICD-10-CM | POA: Insufficient documentation

## 2017-07-16 DIAGNOSIS — I129 Hypertensive chronic kidney disease with stage 1 through stage 4 chronic kidney disease, or unspecified chronic kidney disease: Secondary | ICD-10-CM | POA: Insufficient documentation

## 2017-07-16 DIAGNOSIS — Z79899 Other long term (current) drug therapy: Secondary | ICD-10-CM | POA: Diagnosis not present

## 2017-07-16 DIAGNOSIS — R300 Dysuria: Secondary | ICD-10-CM | POA: Insufficient documentation

## 2017-07-16 DIAGNOSIS — E1122 Type 2 diabetes mellitus with diabetic chronic kidney disease: Secondary | ICD-10-CM | POA: Insufficient documentation

## 2017-07-16 DIAGNOSIS — K219 Gastro-esophageal reflux disease without esophagitis: Secondary | ICD-10-CM | POA: Insufficient documentation

## 2017-07-16 DIAGNOSIS — Z7984 Long term (current) use of oral hypoglycemic drugs: Secondary | ICD-10-CM | POA: Insufficient documentation

## 2017-07-16 DIAGNOSIS — N138 Other obstructive and reflux uropathy: Secondary | ICD-10-CM | POA: Diagnosis present

## 2017-07-16 DIAGNOSIS — Z87891 Personal history of nicotine dependence: Secondary | ICD-10-CM | POA: Insufficient documentation

## 2017-07-16 DIAGNOSIS — N210 Calculus in bladder: Secondary | ICD-10-CM | POA: Insufficient documentation

## 2017-07-16 DIAGNOSIS — N189 Chronic kidney disease, unspecified: Secondary | ICD-10-CM | POA: Insufficient documentation

## 2017-07-16 HISTORY — PX: TRANSURETHRAL RESECTION OF PROSTATE: SHX73

## 2017-07-16 HISTORY — PX: CYSTOSCOPY WITH LITHOLAPAXY: SHX1425

## 2017-07-16 LAB — POCT I-STAT 4, (NA,K, GLUC, HGB,HCT)
GLUCOSE: 115 mg/dL — AB (ref 65–99)
HEMATOCRIT: 31 % — AB (ref 39.0–52.0)
Hemoglobin: 10.5 g/dL — ABNORMAL LOW (ref 13.0–17.0)
Potassium: 3.5 mmol/L (ref 3.5–5.1)
SODIUM: 139 mmol/L (ref 135–145)

## 2017-07-16 LAB — GLUCOSE, CAPILLARY
GLUCOSE-CAPILLARY: 127 mg/dL — AB (ref 65–99)
Glucose-Capillary: 119 mg/dL — ABNORMAL HIGH (ref 65–99)

## 2017-07-16 SURGERY — CYSTOSCOPY, WITH BLADDER CALCULUS LITHOLAPAXY
Anesthesia: General | Site: Prostate | Wound class: Clean Contaminated

## 2017-07-16 MED ORDER — CEFAZOLIN SODIUM-DEXTROSE 1-4 GM/50ML-% IV SOLN
1.0000 g | Freq: Three times a day (TID) | INTRAVENOUS | Status: AC
  Administered 2017-07-16 – 2017-07-17 (×2): 1 g via INTRAVENOUS
  Filled 2017-07-16 (×3): qty 50

## 2017-07-16 MED ORDER — SODIUM CHLORIDE 0.9 % IJ SOLN
INTRAMUSCULAR | Status: AC
  Filled 2017-07-16: qty 10

## 2017-07-16 MED ORDER — CITALOPRAM HYDROBROMIDE 20 MG PO TABS
10.0000 mg | ORAL_TABLET | Freq: Every day | ORAL | Status: DC
  Administered 2017-07-17: 10 mg via ORAL
  Filled 2017-07-16: qty 1

## 2017-07-16 MED ORDER — DOCUSATE SODIUM 100 MG PO CAPS: 100.0000 mg | ORAL_CAPSULE | Freq: Two times a day (BID) | ORAL | Status: DC

## 2017-07-16 MED ORDER — HEPARIN SODIUM (PORCINE) 5000 UNIT/ML IJ SOLN
5000.0000 [IU] | Freq: Three times a day (TID) | INTRAMUSCULAR | Status: DC
  Administered 2017-07-16 – 2017-07-17 (×3): 5000 [IU] via SUBCUTANEOUS
  Filled 2017-07-16 (×3): qty 1

## 2017-07-16 MED ORDER — METOPROLOL TARTRATE 50 MG PO TABS
100.0000 mg | ORAL_TABLET | Freq: Two times a day (BID) | ORAL | Status: DC
  Administered 2017-07-16 – 2017-07-17 (×2): 100 mg via ORAL
  Filled 2017-07-16 (×2): qty 2

## 2017-07-16 MED ORDER — LIDOCAINE HCL (CARDIAC) PF 100 MG/5ML IV SOSY
PREFILLED_SYRINGE | INTRAVENOUS | Status: DC | PRN
  Administered 2017-07-16: 100 mg via INTRAVENOUS

## 2017-07-16 MED ORDER — MIDAZOLAM HCL 2 MG/2ML IJ SOLN
INTRAMUSCULAR | Status: AC
  Filled 2017-07-16: qty 2

## 2017-07-16 MED ORDER — SUGAMMADEX SODIUM 200 MG/2ML IV SOLN
INTRAVENOUS | Status: AC
  Filled 2017-07-16: qty 2

## 2017-07-16 MED ORDER — DEXMEDETOMIDINE HCL 200 MCG/2ML IV SOLN
INTRAVENOUS | Status: DC | PRN
  Administered 2017-07-16: 12 ug via INTRAVENOUS

## 2017-07-16 MED ORDER — FAMOTIDINE 20 MG PO TABS
20.0000 mg | ORAL_TABLET | Freq: Two times a day (BID) | ORAL | Status: DC
  Administered 2017-07-16 – 2017-07-17 (×2): 20 mg via ORAL
  Filled 2017-07-16 (×2): qty 1

## 2017-07-16 MED ORDER — OXYBUTYNIN CHLORIDE 5 MG PO TABS: 5.0000 mg | ORAL_TABLET | Freq: Three times a day (TID) | ORAL | Status: DC | PRN

## 2017-07-16 MED ORDER — POTASSIUM CHLORIDE CRYS ER 10 MEQ PO TBCR
10.0000 meq | EXTENDED_RELEASE_TABLET | Freq: Every day | ORAL | Status: DC
  Administered 2017-07-16 – 2017-07-17 (×2): 10 meq via ORAL
  Filled 2017-07-16 (×2): qty 1

## 2017-07-16 MED ORDER — ALBUTEROL SULFATE HFA 108 (90 BASE) MCG/ACT IN AERS
INHALATION_SPRAY | RESPIRATORY_TRACT | Status: AC
  Filled 2017-07-16: qty 6.7

## 2017-07-16 MED ORDER — DIPHENHYDRAMINE HCL 50 MG/ML IJ SOLN: 12.5000 mg | Freq: Four times a day (QID) | INTRAMUSCULAR | Status: DC | PRN

## 2017-07-16 MED ORDER — TAMSULOSIN HCL 0.4 MG PO CAPS
0.4000 mg | ORAL_CAPSULE | Freq: Every day | ORAL | Status: DC
  Administered 2017-07-16: 0.4 mg via ORAL
  Filled 2017-07-16: qty 1

## 2017-07-16 MED ORDER — PROPOFOL 10 MG/ML IV BOLUS
INTRAVENOUS | Status: AC
  Filled 2017-07-16: qty 20

## 2017-07-16 MED ORDER — OXYCODONE-ACETAMINOPHEN 5-325 MG PO TABS: 1.0000 | ORAL_TABLET | ORAL | Status: DC | PRN

## 2017-07-16 MED ORDER — DEXAMETHASONE SODIUM PHOSPHATE 10 MG/ML IJ SOLN
INTRAMUSCULAR | Status: DC | PRN
  Administered 2017-07-16: 5 mg via INTRAVENOUS

## 2017-07-16 MED ORDER — EPHEDRINE SULFATE 50 MG/ML IJ SOLN
INTRAMUSCULAR | Status: AC
  Filled 2017-07-16: qty 1

## 2017-07-16 MED ORDER — LIDOCAINE HCL (PF) 2 % IJ SOLN
INTRAMUSCULAR | Status: AC
  Filled 2017-07-16: qty 10

## 2017-07-16 MED ORDER — HYDROCHLOROTHIAZIDE 25 MG PO TABS
25.0000 mg | ORAL_TABLET | Freq: Every day | ORAL | Status: DC
  Administered 2017-07-16 – 2017-07-17 (×2): 25 mg via ORAL
  Filled 2017-07-16 (×2): qty 1

## 2017-07-16 MED ORDER — SODIUM CHLORIDE 0.9 % IV SOLN
INTRAVENOUS | Status: DC
  Administered 2017-07-16: 17:00:00 via INTRAVENOUS

## 2017-07-16 MED ORDER — FENTANYL CITRATE (PF) 100 MCG/2ML IJ SOLN: 25.0000 ug | INTRAMUSCULAR | Status: DC | PRN

## 2017-07-16 MED ORDER — PROPOFOL 10 MG/ML IV BOLUS
INTRAVENOUS | Status: DC | PRN
  Administered 2017-07-16: 50 mg via INTRAVENOUS
  Administered 2017-07-16: 200 mg via INTRAVENOUS

## 2017-07-16 MED ORDER — ONDANSETRON HCL 4 MG/2ML IJ SOLN
INTRAMUSCULAR | Status: DC | PRN
  Administered 2017-07-16: 4 mg via INTRAVENOUS

## 2017-07-16 MED ORDER — ROCURONIUM BROMIDE 100 MG/10ML IV SOLN
INTRAVENOUS | Status: DC | PRN
  Administered 2017-07-16: 50 mg via INTRAVENOUS

## 2017-07-16 MED ORDER — VASOPRESSIN 20 UNIT/ML IV SOLN
INTRAVENOUS | Status: AC
  Filled 2017-07-16: qty 1

## 2017-07-16 MED ORDER — GLYCOPYRROLATE 0.2 MG/ML IJ SOLN
INTRAMUSCULAR | Status: DC | PRN
  Administered 2017-07-16: 0.2 mg via INTRAVENOUS

## 2017-07-16 MED ORDER — DOCUSATE SODIUM 100 MG PO CAPS
100.0000 mg | ORAL_CAPSULE | Freq: Two times a day (BID) | ORAL | Status: DC
  Administered 2017-07-16 – 2017-07-17 (×2): 100 mg via ORAL
  Filled 2017-07-16 (×2): qty 1

## 2017-07-16 MED ORDER — ONDANSETRON HCL 4 MG/2ML IJ SOLN: 4.0000 mg | INTRAMUSCULAR | Status: DC | PRN

## 2017-07-16 MED ORDER — ALBUTEROL SULFATE HFA 108 (90 BASE) MCG/ACT IN AERS
INHALATION_SPRAY | RESPIRATORY_TRACT | Status: DC | PRN
  Administered 2017-07-16: 6 via RESPIRATORY_TRACT

## 2017-07-16 MED ORDER — ACETAMINOPHEN 325 MG PO TABS: 650.0000 mg | ORAL_TABLET | ORAL | Status: DC | PRN

## 2017-07-16 MED ORDER — CEFAZOLIN SODIUM-DEXTROSE 2-3 GM-%(50ML) IV SOLR
INTRAVENOUS | Status: AC
  Filled 2017-07-16: qty 50

## 2017-07-16 MED ORDER — LORATADINE 10 MG PO TABS
10.0000 mg | ORAL_TABLET | Freq: Every day | ORAL | Status: DC
  Administered 2017-07-17: 10 mg via ORAL
  Filled 2017-07-16: qty 1

## 2017-07-16 MED ORDER — AMLODIPINE BESYLATE 10 MG PO TABS
10.0000 mg | ORAL_TABLET | Freq: Every day | ORAL | Status: DC
  Administered 2017-07-17: 10 mg via ORAL
  Filled 2017-07-16: qty 1

## 2017-07-16 MED ORDER — FENTANYL CITRATE (PF) 100 MCG/2ML IJ SOLN
INTRAMUSCULAR | Status: AC
  Filled 2017-07-16: qty 2

## 2017-07-16 MED ORDER — PHENYLEPHRINE HCL 10 MG/ML IJ SOLN
INTRAMUSCULAR | Status: DC | PRN
  Administered 2017-07-16 (×2): 200 ug via INTRAVENOUS
  Administered 2017-07-16 (×3): 100 ug via INTRAVENOUS

## 2017-07-16 MED ORDER — MORPHINE SULFATE (PF) 2 MG/ML IV SOLN: 2.0000 mg | INTRAVENOUS | Status: DC | PRN

## 2017-07-16 MED ORDER — SODIUM CHLORIDE 0.9 % IV SOLN
INTRAVENOUS | Status: DC
  Administered 2017-07-16 (×2): via INTRAVENOUS

## 2017-07-16 MED ORDER — SUGAMMADEX SODIUM 200 MG/2ML IV SOLN
INTRAVENOUS | Status: DC | PRN
  Administered 2017-07-16: 193.2 mg via INTRAVENOUS
  Administered 2017-07-16: 200 mg via INTRAVENOUS

## 2017-07-16 MED ORDER — ONDANSETRON HCL 4 MG/2ML IJ SOLN: 4.0000 mg | Freq: Once | INTRAMUSCULAR | Status: DC | PRN

## 2017-07-16 MED ORDER — ONDANSETRON HCL 4 MG/2ML IJ SOLN
INTRAMUSCULAR | Status: AC
  Filled 2017-07-16: qty 2

## 2017-07-16 MED ORDER — FLUTICASONE PROPIONATE 50 MCG/ACT NA SUSP
2.0000 | Freq: Every day | NASAL | Status: DC
  Administered 2017-07-17: 2 via NASAL
  Filled 2017-07-16: qty 16

## 2017-07-16 MED ORDER — MIDAZOLAM HCL 2 MG/2ML IJ SOLN
INTRAMUSCULAR | Status: DC | PRN
  Administered 2017-07-16: 2 mg via INTRAVENOUS

## 2017-07-16 MED ORDER — PRAVASTATIN SODIUM 20 MG PO TABS
20.0000 mg | ORAL_TABLET | Freq: Every day | ORAL | Status: DC
  Administered 2017-07-16: 20 mg via ORAL
  Filled 2017-07-16: qty 1

## 2017-07-16 MED ORDER — GLYCOPYRROLATE 0.2 MG/ML IJ SOLN
INTRAMUSCULAR | Status: AC
  Filled 2017-07-16: qty 1

## 2017-07-16 MED ORDER — LISINOPRIL 20 MG PO TABS
40.0000 mg | ORAL_TABLET | Freq: Every day | ORAL | Status: DC
  Administered 2017-07-17: 40 mg via ORAL
  Filled 2017-07-16: qty 2

## 2017-07-16 MED ORDER — DEXMEDETOMIDINE HCL IN NACL 200 MCG/50ML IV SOLN
INTRAVENOUS | Status: AC
  Filled 2017-07-16: qty 50

## 2017-07-16 MED ORDER — FENTANYL CITRATE (PF) 100 MCG/2ML IJ SOLN
INTRAMUSCULAR | Status: DC | PRN
  Administered 2017-07-16 (×2): 50 ug via INTRAVENOUS

## 2017-07-16 MED ORDER — EPHEDRINE SULFATE 50 MG/ML IJ SOLN
INTRAMUSCULAR | Status: DC | PRN
  Administered 2017-07-16 (×5): 10 mg via INTRAVENOUS

## 2017-07-16 MED ORDER — BELLADONNA ALKALOIDS-OPIUM 16.2-60 MG RE SUPP: 1.0000 | Freq: Four times a day (QID) | RECTAL | Status: DC | PRN

## 2017-07-16 MED ORDER — DIPHENHYDRAMINE HCL 12.5 MG/5ML PO ELIX
12.5000 mg | ORAL_SOLUTION | Freq: Four times a day (QID) | ORAL | Status: DC | PRN
  Filled 2017-07-16: qty 5

## 2017-07-16 MED ORDER — METFORMIN HCL 500 MG PO TABS
500.0000 mg | ORAL_TABLET | Freq: Two times a day (BID) | ORAL | Status: DC
  Administered 2017-07-16 – 2017-07-17 (×2): 500 mg via ORAL
  Filled 2017-07-16 (×2): qty 1

## 2017-07-16 SURGICAL SUPPLY — 25 items
ADAPTER IRRIG TUBE 2 SPIKE SOL (ADAPTER) ×8 IMPLANT
BAG DRAIN CYSTO-URO LG1000N (MISCELLANEOUS) ×4 IMPLANT
BAG URO DRAIN 4000ML (MISCELLANEOUS) ×4 IMPLANT
BASKET ZERO TIP 1.9FR (BASKET) IMPLANT
CATH FOL 2WAY LX 24X30 (CATHETERS) IMPLANT
CATH FOLEY 3WAY 30CC 22FR (CATHETERS) ×4 IMPLANT
DRAPE UTILITY 15X26 TOWEL STRL (DRAPES) ×4 IMPLANT
ELECT LOOP 22F BIPOLAR SML (ELECTROSURGICAL) ×4
ELECTRODE LOOP 22F BIPOLAR SML (ELECTROSURGICAL) ×2 IMPLANT
GLOVE BIO SURGEON STRL SZ 6.5 (GLOVE) ×3 IMPLANT
GLOVE BIO SURGEONS STRL SZ 6.5 (GLOVE) ×1
GOWN STRL REUS W/ TWL LRG LVL3 (GOWN DISPOSABLE) ×4 IMPLANT
GOWN STRL REUS W/TWL LRG LVL3 (GOWN DISPOSABLE) ×4
HOLDER FOLEY CATH W/STRAP (MISCELLANEOUS) IMPLANT
KIT TURNOVER CYSTO (KITS) ×4 IMPLANT
LOOP CUT BIPOLAR 24F LRG (ELECTROSURGICAL) IMPLANT
PACK CYSTO AR (MISCELLANEOUS) ×4 IMPLANT
SET IRRIG Y TYPE TUR BLADDER L (SET/KITS/TRAYS/PACK) ×4 IMPLANT
SET IRRIGATING DISP (SET/KITS/TRAYS/PACK) ×4 IMPLANT
SOL .9 NS 3000ML IRR  AL (IV SOLUTION) ×8
SOL .9 NS 3000ML IRR UROMATIC (IV SOLUTION) ×8 IMPLANT
SYR TOOMEY 50ML (SYRINGE) ×4 IMPLANT
SYRINGE IRR TOOMEY STRL 70CC (SYRINGE) ×4 IMPLANT
WATER STERILE IRR 1000ML POUR (IV SOLUTION) ×4 IMPLANT
WATER STERILE IRR 3000ML UROMA (IV SOLUTION) ×4 IMPLANT

## 2017-07-16 NOTE — Op Note (Signed)
Date of procedure: 07/16/17  Preoperative diagnosis:  1. BPH with urinary obstruction 2. Bladder stone  Postoperative diagnosis:  1. Same as above  Procedure: 1. Cystolitholapaxy 2. 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  Right-sided prostatic lobe regrowth   EBL: 100 cc  Specimens: Prostate chips  Drains: 22 French catheter with 30 cc balloon  Indication: Brandon Benjamin is a 72 y.o. patient with personal history of BPH who developed dysuria of unknown of a bladder stone and right lateral prostatic regrowth.  After reviewing the management options for treatment, he elected to proceed with the above surgical procedure(s). We have discussed the potential benefits and risks of the procedure, side effects of the proposed treatment, the likelihood of the patient achieving the goals of the procedure, and any potential problems that might occur during the procedure or recuperation. Informed consent has been obtained.  Description of procedure:  The patient was taken to the operating room and general anesthesia was induced.  The patient was placed in the dorsal lithotomy position, prepped and draped in the usual sterile fashion, and preoperative antibiotics were administered. A preoperative time-out was performed.   Using a blunt angled obturator, 26 French cystoscope was introduced per urethra into the bladder.  The bladder was carefully inspected and noted to be trabeculated with some small saccules.  There is a bladder stone, approximately 1 cm in diameter near the bladder neck.  The trigone was normal with reflux of concentrated appearing urine from both ureters.  This point in time, 550 m laser fiber was brought in and using settings of 2 J and 20 Hz, the stone was fragmented into approximately 8 or 9 smaller pieces and evacuated  from the bladder without difficulty.  At this point time, large bipolar loop was brought in and the prostatic fossa was inspected.  Notably, the entire left lateral lobe was absent with a clear defect of the prostatic fossa down to the capsule on the side.  On the right, however, there was a lobe coapting into the prostatic fossa.  Bipolar was used to take down the slope and the best attempt to match it to the left fossa.  The lobe was taken down to the level of The Majority of the Lobe.  Care Was Taken to Avoid Any Resection beyond the Vero.  Once the Resection Was Deemed Adequate, the Bladder Was Irrigated Using a Child psychotherapist and the [employer].  Careful Hemostasis Was Achieved within the Prostatic Fossa.  Once Complete, the Bladder Was Drained and the Scope Was Removed.  A 22 French Three-Way Foley Catheter Was Then Inserted.  The Balloon Was Filled with 30 Cc of Sterile Water.  He was started on a slow drip bladder irrigation.  The patient was then cleaned and dried repositioned in supine position and taken to the PACU in stable condition.  Plan: We will admit him overnight for CBI and observation.  Hollice Espy, M.D.

## 2017-07-16 NOTE — Anesthesia Procedure Notes (Deleted)
Performed by: Alvin Critchley, MD

## 2017-07-16 NOTE — Anesthesia Postprocedure Evaluation (Signed)
Anesthesia Post Note  Patient: Brandon Benjamin  Procedure(s) Performed: CYSTOSCOPY WITH LITHOLAPAXY (N/A Bladder) TRANSURETHRAL RESECTION OF THE PROSTATE (TURP) (N/A Prostate)  Patient location during evaluation: PACU Anesthesia Type: General Level of consciousness: awake and alert Pain management: pain level controlled Vital Signs Assessment: post-procedure vital signs reviewed and stable Respiratory status: spontaneous breathing and respiratory function stable Cardiovascular status: stable Anesthetic complications: no     Last Vitals:  Vitals:   07/16/17 1138 07/16/17 1453  BP: (!) 153/89 128/73  Pulse: 63   Resp: 20   Temp: (!) 36.3 C 36.6 C  SpO2: 98%     Last Pain:  Vitals:   07/16/17 1138  TempSrc: Oral  PainSc: 2                  Alazia Crocket K

## 2017-07-16 NOTE — Transfer of Care (Signed)
Immediate Anesthesia Transfer of Care Note  Patient: Brandon Benjamin  Procedure(s) Performed: CYSTOSCOPY WITH LITHOLAPAXY (N/A Bladder) TRANSURETHRAL RESECTION OF THE PROSTATE (TURP) (N/A Prostate)  Patient Location: PACU  Anesthesia Type:General  Level of Consciousness: awake, alert  and oriented  Airway & Oxygen Therapy: Patient Spontanous Breathing  Post-op Assessment: Report given to RN and Post -op Vital signs reviewed and stable  Post vital signs: Reviewed and stable  Last Vitals:  Vitals Value Taken Time  BP 128/73 07/16/2017  2:53 PM  Temp 36.6 C 07/16/2017  2:53 PM  Pulse 74 07/16/2017  2:55 PM  Resp 22 07/16/2017  2:55 PM  SpO2 100 % 07/16/2017  2:55 PM  Vitals shown include unvalidated device data.  Last Pain:  Vitals:   07/16/17 1138  TempSrc: Oral  PainSc: 2       Patients Stated Pain Goal: 1 (45/36/46 8032)  Complications: No apparent anesthesia complications

## 2017-07-16 NOTE — Anesthesia Preprocedure Evaluation (Addendum)
Anesthesia Evaluation  Patient identified by MRN, date of birth, ID band Patient awake    Reviewed: Allergy & Precautions, NPO status , Patient's Chart, lab work & pertinent test results, reviewed documented beta blocker date and time   Airway Mallampati: II  TM Distance: >3 FB Neck ROM: Full    Dental  (+) Edentulous Lower, Edentulous Upper   Pulmonary former smoker,    Pulmonary exam normal breath sounds clear to auscultation       Cardiovascular hypertension, Pt. on medications and Pt. on home beta blockers Normal cardiovascular exam     Neuro/Psych negative neurological ROS  negative psych ROS   GI/Hepatic Neg liver ROS, GERD  Medicated and Controlled,  Endo/Other  diabetes, Well Controlled, Type 2, Oral Hypoglycemic Agents  Renal/GU Renal InsufficiencyRenal disease     Musculoskeletal negative musculoskeletal ROS (+)   Abdominal Normal abdominal exam  (+)   Peds negative pediatric ROS (+)  Hematology  (+) anemia ,   Anesthesia Other Findings Prostate BPH  Reproductive/Obstetrics                             Anesthesia Physical  Anesthesia Plan  ASA: III  Anesthesia Plan: General   Post-op Pain Management:    Induction: Intravenous  PONV Risk Score and Plan:   Airway Management Planned: Oral ETT and LMA  Additional Equipment:   Intra-op Plan:   Post-operative Plan: Extubation in OR  Informed Consent: I have reviewed the patients History and Physical, chart, labs and discussed the procedure including the risks, benefits and alternatives for the proposed anesthesia with the patient or authorized representative who has indicated his/her understanding and acceptance.   Dental advisory given  Plan Discussed with: Surgeon and CRNA  Anesthesia Plan Comments:         Anesthesia Quick Evaluation

## 2017-07-16 NOTE — Anesthesia Procedure Notes (Addendum)
Procedure Name: LMA Insertion Date/Time: 07/16/2017 1:12 PM Performed by: Carver Fila, RN Pre-anesthesia Checklist: Patient identified, Emergency Drugs available, Suction available, Patient being monitored and Timeout performed Patient Re-evaluated:Patient Re-evaluated prior to induction Oxygen Delivery Method: Circle system utilized Preoxygenation: Pre-oxygenation with 100% oxygen Induction Type: IV induction Ventilation: Mask ventilation without difficulty LMA: LMA inserted LMA Size: 5.0 Number of attempts: 1

## 2017-07-16 NOTE — Anesthesia Procedure Notes (Addendum)
Procedure Name: Intubation Date/Time: 07/16/2017 1:16 PM Performed by: Carver Fila, RN Pre-anesthesia Checklist: Patient identified, Emergency Drugs available, Suction available, Patient being monitored and Timeout performed Patient Re-evaluated:Patient Re-evaluated prior to induction Oxygen Delivery Method: Circle system utilized Preoxygenation: Pre-oxygenation with 100% oxygen Induction Type: IV induction Ventilation: Mask ventilation without difficulty Laryngoscope Size: Miller and 3 Grade View: Grade I Tube type: Oral Tube size: 7.5 mm Number of attempts: 1 Airway Equipment and Method: Patient positioned with wedge pillow and Stylet Placement Confirmation: ETT inserted through vocal cords under direct vision,  positive ETCO2 and breath sounds checked- equal and bilateral Secured at: 23 cm Tube secured with: Tape Dental Injury: Teeth and Oropharynx as per pre-operative assessment

## 2017-07-16 NOTE — Interval H&P Note (Signed)
History and Physical Interval Note:  07/16/2017 12:45 PM  Brandon Benjamin  has presented today for surgery, with the diagnosis of BPH, bladder stone  The various methods of treatment have been discussed with the patient and family. After consideration of risks, benefits and other options for treatment, the patient has consented to  Procedure(s): CYSTOSCOPY WITH LITHOLAPAXY (N/A) TRANSURETHRAL RESECTION OF THE PROSTATE (TURP) (N/A) as a surgical intervention .  The patient's history has been reviewed, patient examined, no change in status, stable for surgery.  I have reviewed the patient's chart and labs.  Questions were answered to the patient's satisfaction.   RRR CTAB   Hollice Espy

## 2017-07-16 NOTE — Anesthesia Post-op Follow-up Note (Signed)
Anesthesia QCDR form completed.        

## 2017-07-17 ENCOUNTER — Encounter: Payer: Self-pay | Admitting: Urology

## 2017-07-17 DIAGNOSIS — N401 Enlarged prostate with lower urinary tract symptoms: Secondary | ICD-10-CM | POA: Diagnosis not present

## 2017-07-17 LAB — BASIC METABOLIC PANEL
Anion gap: 6 (ref 5–15)
BUN: 17 mg/dL (ref 6–20)
CHLORIDE: 104 mmol/L (ref 101–111)
CO2: 27 mmol/L (ref 22–32)
CREATININE: 1.01 mg/dL (ref 0.61–1.24)
Calcium: 9 mg/dL (ref 8.9–10.3)
GFR calc Af Amer: >60 mL/min (ref >60)
GFR calc non Af Amer: >60 mL/min (ref >60)
Glucose, Bld: 137 mg/dL — ABNORMAL HIGH (ref 65–99)
POTASSIUM: 3.5 mmol/L (ref 3.5–5.1)
Sodium: 137 mmol/L (ref 135–145)

## 2017-07-17 LAB — CBC
HEMATOCRIT: 26.8 % — AB (ref 40.0–52.0)
HEMOGLOBIN: 8.1 g/dL — AB (ref 13.0–18.0)
MCH: 20.9 pg — AB (ref 26.0–34.0)
MCHC: 30.4 g/dL — AB (ref 32.0–36.0)
MCV: 68.8 fL — AB (ref 80.0–100.0)
Platelets: 159 10*3/uL (ref 150–440)
RBC: 3.89 MIL/uL — AB (ref 4.40–5.90)
RDW: 22 % — ABNORMAL HIGH (ref 11.5–14.5)
WBC: 7.7 10*3/uL (ref 3.8–10.6)

## 2017-07-17 MED ORDER — ALUM & MAG HYDROXIDE-SIMETH 200-200-20 MG/5ML PO SUSP
30.0000 mL | Freq: Four times a day (QID) | ORAL | Status: DC | PRN
  Administered 2017-07-17: 30 mL via ORAL
  Filled 2017-07-17: qty 30

## 2017-07-17 NOTE — Progress Notes (Signed)
Patient bladder scanned and had 296 ml of urine present. Will continue to monitor patient for voiding and notify Dr. Erlene Quan.

## 2017-07-17 NOTE — Discharge Summary (Signed)
Date of admission: 07/16/2017  Date of discharge: 07/17/2017  Admission diagnosis: BPH with bladder outlet obstruction, bladder stone  Discharge diagnosis: Same as above  Secondary diagnoses:  Patient Active Problem List   Diagnosis Date Noted  . BPH with urinary obstruction 07/16/2017  . S/P cholecystectomy 12/25/2016  . Cholecystitis, acute   . Umbilical hernia without obstruction and without gangrene   . Calculus of gallbladder without cholecystitis without obstruction   . Common bile duct calculus   . Scrotal cyst 11/15/2016  . Urinary urgency 11/15/2016  . Priapism 11/15/2016  . HTN (hypertension) 11/15/2016  . Hyperlipidemia 11/15/2016  . Erectile dysfunction 11/15/2016  . Diabetes education, encounter for 11/15/2016  . Chronic kidney disease 11/15/2016  . BPH (benign prostatic hyperplasia) 11/15/2016  . Anemia 11/15/2016  . Spermatocele 05/20/2015  . BPH with obstruction/lower urinary tract symptoms 11/16/2014    History and Physical: For full details, please see admission history and physical. Briefly, Brandon Benjamin is a 72 y.o. year old patient with history of BPH with urination and regrowth of his right prostatic lobe.  He was admitted following uncomplicated cystolitholapaxy and TURP for overnight CBI.Marland Kitchen   Hospital Course: Patient tolerated the procedure well.  He was then transferred to the floor after an uneventful PACU stay.  His hospital course was uncomplicated.  CBI was titrated off overnight and he underwent a successful voiding trial.  On POD#1 he had met discharge criteria: was eating a regular diet, was up and ambulating independently,  pain was well controlled, was voiding without a catheter, and was ready to for discharge.  Physical Exam  Constitutional: He is oriented to person, place, and time. He appears well-developed and well-nourished.  HENT:  Head: Normocephalic and atraumatic.  Pulmonary/Chest: Effort normal. No respiratory distress.  Abdominal:  Soft. He exhibits no distension. There is no tenderness.  Genitourinary: Penis normal.  Genitourinary Comments: Foley draining clear yellow urine and very slow drip  Neurological: He is alert and oriented to person, place, and time.  Skin: Skin is warm and dry.    Laboratory values:  Recent Labs    07/16/17 1204 07/17/17 0455  WBC  --  7.7  HGB 10.5* 8.1*  HCT 31.0* 26.8*   Recent Labs    07/16/17 1204 07/17/17 0455  NA 139 137  K 3.5 3.5  CL  --  104  CO2  --  27  GLUCOSE 115* 137*  BUN  --  17  CREATININE  --  1.01  CALCIUM  --  9.0   Disposition: Home  Discharge instruction: The patient was instructed to be ambulatory but told to refrain from heavy lifting, strenuous activity, or driving.   Discharge medications:  Allergies as of 07/17/2017   No Known Allergies     Medication List    TAKE these medications   amLODipine 10 MG tablet Commonly known as:  NORVASC Take 10 mg by mouth daily.   cetirizine 10 MG tablet Commonly known as:  ZYRTEC Take 10 mg by mouth daily.   citalopram 10 MG tablet Commonly known as:  CELEXA Take 10 mg by mouth daily.   docusate sodium 100 MG capsule Commonly known as:  COLACE Take 100 mg by mouth 2 (two) times daily.   fluticasone 50 MCG/ACT nasal spray Commonly known as:  FLONASE Place 2 sprays into both nostrils daily.   hydrochlorothiazide 25 MG tablet Commonly known as:  HYDRODIURIL Take 25 mg by mouth daily.   lisinopril 40 MG tablet Commonly  known as:  PRINIVIL,ZESTRIL Take 40 mg by mouth daily.   lovastatin 20 MG tablet Commonly known as:  MEVACOR Take 20 mg by mouth at bedtime.   metFORMIN 500 MG tablet Commonly known as:  GLUCOPHAGE Take 500 mg by mouth 2 (two) times daily.   metoprolol tartrate 100 MG tablet Commonly known as:  LOPRESSOR Take 100 mg by mouth 2 (two) times daily.   multivitamin with minerals Tabs tablet Take 1 tablet by mouth daily. Centrum Silver Multivitamin   nitrofurantoin  (macrocrystal-monohydrate) 100 MG capsule Commonly known as:  MACROBID Take 1 capsule (100 mg total) by mouth every 12 (twelve) hours.   potassium chloride 10 MEQ tablet Commonly known as:  K-DUR Take 10 mEq by mouth daily.   ranitidine 150 MG tablet Commonly known as:  ZANTAC Take 150 mg by mouth 2 (two) times daily.   tamsulosin 0.4 MG Caps capsule Commonly known as:  FLOMAX Take 1 capsule (0.4 mg total) by mouth daily after supper.   TYLENOL 8 HOUR ARTHRITIS PAIN 650 MG CR tablet Generic drug:  acetaminophen Take 650 mg by mouth every 8 (eight) hours as needed for pain.   URIBEL 118 MG Caps 1 tablet 4x daily What changed:    how much to take  how to take this  when to take this  additional instructions       Followup:  Follow-up Information    Hollice Espy, MD.   Specialty:  Urology Why:  [address] as scheduled Contact information: [address] [place] [id number] [phone]

## 2017-07-17 NOTE — Discharge Instructions (Signed)
Transurethral Resection of the Prostate, Care After °Refer to this sheet in the next few weeks. These instructions provide Brandon Benjamin with information about caring for yourself after your procedure. Your health care provider Brandon Benjamin also give Brandon Benjamin more specific instructions. Your treatment has been planned according to current medical practices, but problems sometimes occur. Call your health care provider if Brandon Benjamin have any problems or questions after your procedure. °What can I expect after the procedure? °After the procedure, it is common to have: °· Mild pain in your lower abdomen. °· Soreness or mild discomfort in your penis from having the catheter inserted during the procedure. °· A feeling of urgency when Brandon Benjamin need to urinate. °· A small amount of blood in your urine. Brandon Benjamin Brandon Benjamin notice some small blood clots in your urine. These are normal. ° °Follow these instructions at home: °Medicines ° °· Take over-the-counter and prescription medicines only as told by your health care provider. °· Do not drive or operate heavy machinery while taking prescription pain medicine. °· Do not drive for 24 hours if Brandon Benjamin received a sedative. °· If Brandon Benjamin were prescribed antibiotic medicine, take it as told by your health care provider. Do not stop taking the antibiotic even if Brandon Benjamin start to feel better. °Activity °· Return to your normal activities as told by your health care provider. Ask your health care provider what activities are safe for Brandon Benjamin. °· Do not lift anything that is heavier than 10 lb (4.5 kg) for 3 weeks after your procedure, or as long as told by your health care provider. °· Avoid intense physical activity for as long as told by your health care provider. °· Walk at least one time every day. This helps to prevent blood clots. Brandon Benjamin Brandon Benjamin increase your physical activity gradually as Brandon Benjamin start to feel better. °Lifestyle °· Do not drink alcohol for as long as told by your health care provider. This is especially important if Brandon Benjamin are taking  prescription pain medicines. °· Do not engage in sexual activity until your health care provider says that Brandon Benjamin can do this. °General instructions °· Do not take baths, swim, or use a hot tub until your health care provider approves. °· Drink enough fluid to keep your urine clear or pale yellow. °· Urinate as soon as Brandon Benjamin feel the need to. Do not try to hold your urine for long periods of time. °· If your health care provider approves, Brandon Benjamin Brandon Benjamin take a stool softener for 2-3 weeks to prevent Brandon Benjamin from straining to have a bowel movement. °· Wear compression stockings as told by your health care provider. These stockings help to prevent blood clots and reduce swelling in your legs. °· Keep all follow-up visits as told by your health care provider. This is important. °Contact a health care provider if: °· Brandon Benjamin have difficulty urinating. °· Brandon Benjamin have a fever. °· Brandon Benjamin have pain that gets worse or does not improve with medicine. °· Brandon Benjamin have blood in your urine that does not go away after 1 week of resting and drinking more fluids. °· Brandon Benjamin have swelling in your penis or testicles. °Get help right away if: °· Brandon Benjamin are unable to urinate. °· Brandon Benjamin are having more blood clots in your urine instead of fewer. °· Brandon Benjamin have: °? Large blood clots. °? A lot of blood in your urine. °? Pain in your back or lower abdomen. °? Pain or swelling in your legs. °? Chills and Brandon Benjamin are shaking. °This information is not intended to   replace advice given to Brandon Benjamin by your health care provider. Make sure Brandon Benjamin discuss any questions Brandon Benjamin have with your health care provider. °Document Released: 03/11/2005 Document Revised: 11/12/2015 Document Reviewed: 12/01/2014 °Elsevier Interactive Patient Education © 2017 [employer] ° °

## 2017-07-17 NOTE — Progress Notes (Signed)
Discharge teaching given to patient, patient verbalized understanding and had no questions. Patient IV removed. Patient will be transported home by family. All patient belongings gathered prior to leaving.  

## 2017-07-18 LAB — SURGICAL PATHOLOGY

## 2017-07-24 ENCOUNTER — Ambulatory Visit: Payer: Medicare Other | Admitting: Podiatry

## 2017-08-07 ENCOUNTER — Ambulatory Visit (INDEPENDENT_AMBULATORY_CARE_PROVIDER_SITE_OTHER): Payer: Medicare Other | Admitting: Podiatry

## 2017-08-07 ENCOUNTER — Encounter: Payer: Self-pay | Admitting: Podiatry

## 2017-08-07 DIAGNOSIS — E119 Type 2 diabetes mellitus without complications: Secondary | ICD-10-CM

## 2017-08-07 DIAGNOSIS — M79676 Pain in unspecified toe(s): Secondary | ICD-10-CM | POA: Diagnosis not present

## 2017-08-07 DIAGNOSIS — B351 Tinea unguium: Secondary | ICD-10-CM

## 2017-08-07 NOTE — Progress Notes (Signed)
Complaint:  Visit Type: Patient returns to my office for continued preventative foot care services. Complaint: Patient states" my nails have grown long and thick and become painful to walk and wear shoes" Patient has been diagnosed with DM with no foot complications. The patient presents for preventative foot care services.   Podiatric Exam: Vascular: dorsalis pedis and posterior tibial pulses are palpable bilateral. Capillary return is immediate. Temperature gradient is WNL. Skin turgor WNL  Sensorium: Normal Semmes Weinstein monofilament test. Normal tactile sensation bilaterally. Nail Exam: Pt has thick disfigured discolored nails with subungual debris noted bilateral entire nail hallux through fifth toenails Ulcer Exam: There is no evidence of ulcer or pre-ulcerative changes or infection. Orthopedic Exam: Muscle tone and strength are WNL. No limitations in general ROM. No crepitus or effusions noted. Foot type and digits show no abnormalities. Bony prominences are unremarkable. Skin: No Porokeratosis. No infection or ulcers  Diagnosis:  Onychomycosis, , Pain in right toe, pain in left toes  Treatment & Plan Procedures and Treatment: Consent by patient was obtained for treatment procedures. The patient understood the discussion of treatment and procedures well. All questions were answered thoroughly reviewed. Debridement of mycotic and hypertrophic toenails, 1 through 5 bilateral and clearing of subungual debris. No ulceration, no infection noted.  Return Visit-Office Procedure: Patient instructed to return to the office for a follow up visit 3 months for continued evaluation and treatment.    Othon Guardia DPM 

## 2017-08-21 ENCOUNTER — Encounter: Payer: Self-pay | Admitting: Urology

## 2017-08-21 ENCOUNTER — Ambulatory Visit (INDEPENDENT_AMBULATORY_CARE_PROVIDER_SITE_OTHER): Payer: Medicare Other | Admitting: Urology

## 2017-08-21 VITALS — BP 148/73 | HR 68 | Ht 69.0 in | Wt 214.0 lb

## 2017-08-21 DIAGNOSIS — R300 Dysuria: Secondary | ICD-10-CM

## 2017-08-21 DIAGNOSIS — N138 Other obstructive and reflux uropathy: Secondary | ICD-10-CM

## 2017-08-21 DIAGNOSIS — N401 Enlarged prostate with lower urinary tract symptoms: Secondary | ICD-10-CM

## 2017-08-21 LAB — URINALYSIS, COMPLETE
BILIRUBIN UA: NEGATIVE
Glucose, UA: NEGATIVE
KETONES UA: NEGATIVE
Nitrite, UA: NEGATIVE
PH UA: 5.5 (ref 5.0–7.5)
SPEC GRAV UA: 1.02 (ref 1.005–1.030)
Urobilinogen, Ur: 0.2 mg/dL (ref 0.2–1.0)

## 2017-08-21 LAB — MICROSCOPIC EXAMINATION: Epithelial Cells (non renal): NONE SEEN /hpf (ref 0–10)

## 2017-08-21 LAB — BLADDER SCAN AMB NON-IMAGING

## 2017-08-21 NOTE — Progress Notes (Signed)
08/21/2017 4:19 PM   Brandon Benjamin 09-Apr-1945 [id number]  Referring provider: Letta Median, MD Brandon Benjamin, [place] [address]  Chief Complaint  Patient presents with  . Benign Prostatic Hypertrophy    6wk    HPI: 72 year old male who returns today following TURP/cystolitholapaxy for postop visit.  He initially presented with mixed obstructive and irritative voiding symptoms on maximal medical therapy.  He is found to have massive prostamegaly, TRUS volume 112 g.  He underwent holep in 06/2015 at which time 61 g was resected, pathology benign.  He had dramatic improvement in his urinary symptoms and he was weaned off of his medications.  He returned complaining of dysuria and was found to have a small bladder stone as well as right lateral prostatic regrowth and office cystoscopy.  He returned to the operating room on 07/17/2017 for TURP as well as treatment of a 1 cm bladder stone.  An additional 8 g of tissue was resected from the right lateral lobe, consistent with benign glandular and stromal hyperplasia, negative for malignancy.  His urinary symptoms have returned back to his previous baseline he is doing quite well.  Unfortunately, he continues to have dysuria only when voiding and resolves quickly after voiding.  This is not improved.  He is tried Uribell/Pyridium without much effect.  No other significant urinary symptoms.  IPSS as below.  Uroflow today with excellent parabolic curve, qmax 27 cc, void 131 mL, postvoid residual 5 cc.  IPSS    Row Name 08/21/17 1600         International Prostate Symptom Score   How often have you had the sensation of not emptying your bladder?  Less than 1 in 5     How often have you had to urinate less than every two hours?  Not at All     How often have you found you stopped and started again several times when you urinated?  Not at All     How often have you found it difficult to postpone urination?  Less than  1 in 5 times     How often have you had a weak urinary stream?  Less than 1 in 5 times     How often have you had to strain to start urination?  Not at All     How many times did you typically get up at night to urinate?  1 Time     Total IPSS Score  4       Quality of Life due to urinary symptoms   If you were to spend the rest of your life with your urinary condition just the way it is now how would you feel about that?  Mixed        Score:  1-7 Mild 8-19 Moderate 20-35 Severe   PMH: Past Medical History:  Diagnosis Date  . Anemia   . Anxiety   . Balanitis   . Balanitis   . BPH (benign prostatic hyperplasia)   . Diabetes ([place])   . Erectile dysfunction   . GERD (gastroesophageal reflux disease)   . History of kidney stones   . HLD (hyperlipidemia)   . HTN (hypertension)   . Over weight   . Priapism   . Scrotal cyst   . Urinary urgency     Surgical History: Past Surgical History:  Procedure Laterality Date  . CHOLECYSTECTOMY N/A 12/25/2016   Procedure: LAPAROSCOPIC CHOLECYSTECTOMY WITH INTRAOPERATIVE CHOLANGIOGRAM;  Surgeon: Caroleen Hamman  F, MD;  Location: [facility] ORS;  Service: General;  Laterality: N/A;  . CYSTOSCOPY WITH LITHOLAPAXY N/A 07/16/2017   Procedure: CYSTOSCOPY WITH LITHOLAPAXY;  Surgeon: Hollice Espy, MD;  Location: [facility];  Service: Urology;  Laterality: N/A;  . ERCP N/A 11/28/2016   Procedure: ENDOSCOPIC RETROGRADE CHOLANGIOPANCREATOGRAPHY (ERCP);  Surgeon: [name], MD;  Location: [facility] ENDOSCOPY;  Service: Endoscopy;  Laterality: N/A;  . HOLEP-LASER ENUCLEATION OF THE PROSTATE WITH MORCELLATION N/A 07/11/2015   Procedure: HOLEP-LASER ENUCLEATION OF THE PROSTATE WITH MORCELLATION;  Surgeon: Hollice Espy, MD;  Location: [facility] ORS;  Service: Urology;  Laterality: N/A;  . TRANSURETHRAL RESECTION OF PROSTATE N/A 07/16/2017   Procedure: TRANSURETHRAL RESECTION OF THE PROSTATE (TURP);  Surgeon: Hollice Espy, MD;  Location: [facility];  Service: Urology;   Laterality: N/A;  . UMBILICAL HERNIA REPAIR  12/25/2016   Procedure: HERNIA REPAIR UMBILICAL ADULT;  Surgeon: Jules Husbands, MD;  Location: [facility] ORS;  Service: General;;    Home Medications:  Allergies as of 08/21/2017   No Known Allergies     Medication List        Accurate as of 08/21/17  4:19 PM. Always use your most recent med list.          amLODipine 10 MG tablet Commonly known as:  NORVASC Take 10 mg by mouth daily.   cetirizine 10 MG tablet Commonly known as:  ZYRTEC Take 10 mg by mouth daily.   citalopram 10 MG tablet Commonly known as:  CELEXA Take 10 mg by mouth daily.   docusate sodium 100 MG capsule Commonly known as:  COLACE Take 100 mg by mouth 2 (two) times daily.   fluticasone 50 MCG/ACT nasal spray Commonly known as:  FLONASE Place 2 sprays into both nostrils daily.   hydrochlorothiazide 25 MG tablet Commonly known as:  HYDRODIURIL Take 25 mg by mouth daily.   lisinopril 40 MG tablet Commonly known as:  PRINIVIL,ZESTRIL Take 40 mg by mouth daily.   lovastatin 20 MG tablet Commonly known as:  MEVACOR Take 20 mg by mouth at bedtime.   metFORMIN 500 MG tablet Commonly known as:  GLUCOPHAGE Take 500 mg by mouth 2 (two) times daily.   metoprolol tartrate 100 MG tablet Commonly known as:  LOPRESSOR Take 100 mg by mouth 2 (two) times daily.   multivitamin with minerals Tabs tablet Take 1 tablet by mouth daily. Centrum Silver Multivitamin   nitrofurantoin (macrocrystal-monohydrate) 100 MG capsule Commonly known as:  MACROBID Take 1 capsule (100 mg total) by mouth every 12 (twelve) hours.   potassium chloride 10 MEQ tablet Commonly known as:  K-DUR Take 10 mEq by mouth daily.   ranitidine 150 MG tablet Commonly known as:  ZANTAC Take 150 mg by mouth 2 (two) times daily.   tamsulosin 0.4 MG Caps capsule Commonly known as:  FLOMAX Take 1 capsule (0.4 mg total) by mouth daily after supper.   TYLENOL 8 HOUR ARTHRITIS PAIN 650 MG CR  tablet Generic drug:  acetaminophen Take 650 mg by mouth every 8 (eight) hours as needed for pain.   URIBEL 118 MG Caps 1 tablet 4x daily       Allergies: No Known Allergies  Family History: Family History  Problem Relation Age of Onset  . Cancer Mother        blood stream  . High blood pressure Mother   . Prostate cancer Neg Hx   . Bladder Cancer Neg Hx   . Kidney disease Neg Hx  Social History:  reports that he quit smoking about 22 years ago. His smoking use included cigarettes. He has never used smokeless tobacco. He reports that he does not drink alcohol or use drugs.  ROS: UROLOGY Frequent Urination?: No Hard to postpone urination?: Yes Burning/pain with urination?: Yes Get up at night to urinate?: No Leakage of urine?: No Urine stream starts and stops?: Yes Trouble starting stream?: No Do you have to strain to urinate?: No Blood in urine?: No Urinary tract infection?: No Sexually transmitted disease?: No Injury to kidneys or bladder?: No Painful intercourse?: No Weak stream?: Yes Erection problems?: No Penile pain?: No  Gastrointestinal Nausea?: No Vomiting?: No Indigestion/heartburn?: No Diarrhea?: No Constipation?: Yes  Constitutional Fever: No Night sweats?: No Weight loss?: No Fatigue?: No  Skin Skin rash/lesions?: No Itching?: No  Eyes Blurred vision?: No Double vision?: No  Ears/Nose/Throat Sore throat?: No Sinus problems?: No  Hematologic/Lymphatic Swollen glands?: No Easy bruising?: No  Cardiovascular Leg swelling?: No Chest pain?: No  Respiratory Cough?: No Shortness of breath?: No  Endocrine Excessive thirst?: No  Musculoskeletal Back pain?: No Joint pain?: No  Neurological Headaches?: No Dizziness?: No  Psychologic Depression?: No Anxiety?: No  Physical Exam: BP (!) 148/73   Pulse 68   Ht 5\' 9"  (1.753 m)   Wt 214 lb (97.1 kg)   BMI 31.60 kg/m   Constitutional:  Alert and oriented, No acute  distress.  Accompanied by wife today. HEENT: [place] AT, moist mucus membranes.  Trachea midline, no masses. Cardiovascular: No clubbing, cyanosis, or edema. Respiratory: Normal respiratory effort, no increased work of breathing. Skin: No rashes, bruises or suspicious lesions. Neurologic: Grossly intact, no focal deficits, moving all 4 extremities. Psychiatric: Normal mood and affect.  Laboratory Data: Lab Results  Component Value Date   WBC 7.7 07/17/2017   HGB 8.1 (L) 07/17/2017   HCT 26.8 (L) 07/17/2017   MCV 68.8 (L) 07/17/2017   PLT 159 07/17/2017    Lab Results  Component Value Date   CREATININE 1.01 07/17/2017    Urinalysis UA pending, will return later this afternoon to provide sample for UA/urine culture  Pertinent Imaging: Results for orders placed or performed in visit on 08/21/17  BLADDER SCAN AMB NON-IMAGING  Result Value Ref Range   Scan Result 4ml     Assessment & Plan:    1. BPH with urinary obstruction Status post recent redo TUR of right lateral lobe regrowth and cystolitholapaxy PVR, uroflow, and IPSS excellent today - PR COMPLEX UROFLOWMETRY - BLADDER SCAN AMB NON-IMAGING  2. Dysuria Etiology unclear Previously with enterococcus in urine prior to surgery, will repeat UA/urine culture to ensure that this is completely resolved Cystoscopy was otherwise unremarkable, no evidence of malignancy is contributing factor Etiology unclear at this point We will treat symptomatically, encourage adequate hydration, hope for improvement after irritation from recent surgery has resolved - Urinalysis, Complete - CULTURE, URINE COMPREHENSIVE   Return in about 6 months (around 02/21/2018) for UA/ UPSS/ PVR.  Hollice Espy, MD  [facility] [address] [place], [place] [address] [phone]

## 2017-08-26 ENCOUNTER — Telehealth: Payer: Self-pay

## 2017-08-26 LAB — CULTURE, URINE COMPREHENSIVE

## 2017-08-26 MED ORDER — CIPROFLOXACIN HCL 500 MG PO TABS: 500.0000 mg | ORAL_TABLET | Freq: Two times a day (BID) | ORAL | 0 refills | Status: DC

## 2017-08-26 NOTE — Telephone Encounter (Signed)
Patient's wife notified, script sent to [facility]

## 2017-08-26 NOTE — Telephone Encounter (Signed)
-----   Message from Hollice Espy, MD sent at 08/26/2017  1:44 PM EDT ----- Urine culture grew enterococcus again, albeit low,.  I think we should go ahead and treat this in light of ongoing dysuria.  Treat with Cipro 500 mg twice daily for 10 days.  Please support the patient about the incidence of tendon rupture and avoid vigorous exercise while on this medication.  Hollice Espy, MD

## 2017-08-27 NOTE — Telephone Encounter (Signed)
Spoke w/ Pharmacist at [facility] he states that the Cipro would interact with patient's Celexa and could another medication be sent in for patient. Per Dr. Erlene Quan a verbal order was given for Ampicillin 500mg  QID 10days

## 2017-08-28 ENCOUNTER — Encounter: Payer: Self-pay | Admitting: Gastroenterology

## 2017-08-28 ENCOUNTER — Ambulatory Visit (INDEPENDENT_AMBULATORY_CARE_PROVIDER_SITE_OTHER): Payer: Medicare Other | Admitting: Gastroenterology

## 2017-08-28 VITALS — BP 157/90 | HR 60 | Ht 69.0 in | Wt 213.2 lb

## 2017-08-28 DIAGNOSIS — D509 Iron deficiency anemia, unspecified: Secondary | ICD-10-CM

## 2017-08-28 NOTE — Progress Notes (Signed)
Brandon Bellows MD, MRCP(U.K) [address]  [place]  [place], [place] [address]  Main: [phone]  Fax: [phone]   Gastroenterology Consultation  Referring Provider:     Letta Median, MD Primary Care Physician:  Letta Median, MD Primary Gastroenterologist:  Dr. Jonathon Benjamin  Reason for Consultation:     Black stools and anemia         HPI:   Brandon Benjamin is a 72 y.o. y/o male referred for consultation & management  by Dr. Rebeca Alert, Durene Cal, MD.  He has had BPH and has had recent TURP  Recent labs suggest a microcytic anemia with a Hb 8.1 grams and MCV 68. Was 10.5 grams 1 month back .  He says he has been having black stools on and off ongoing for the past 1 month, denies any abdominal pain . No blood in his stool. No NSAID's.  He has had a colonoscopy in [facility] 2 years back and was normal.   Denies use of any blood thinners. He has been on iron for a month .     Past Medical History:  Diagnosis Date  . Anemia   . Anxiety   . Balanitis   . Balanitis   . BPH (benign prostatic hyperplasia)   . Diabetes ([place])   . Erectile dysfunction   . GERD (gastroesophageal reflux disease)   . History of kidney stones   . HLD (hyperlipidemia)   . HTN (hypertension)   . Over weight   . Priapism   . Scrotal cyst   . Urinary urgency     Past Surgical History:  Procedure Laterality Date  . CHOLECYSTECTOMY N/A 12/25/2016   Procedure: LAPAROSCOPIC CHOLECYSTECTOMY WITH INTRAOPERATIVE CHOLANGIOGRAM;  Surgeon: Jules Husbands, MD;  Location: [facility] ORS;  Service: General;  Laterality: N/A;  . CYSTOSCOPY WITH LITHOLAPAXY N/A 07/16/2017   Procedure: CYSTOSCOPY WITH LITHOLAPAXY;  Surgeon: Hollice Espy, MD;  Location: [facility] OR[facility];  Service: Urology;  Laterality: N/A;  . ERCP N/A 11/28/2016   Procedure: ENDOSCOPIC RETROGRADE CHOLANGIOPANCREATOGRAPHY (ERCP);  Surgeon: [name], MD;  Location: [facility] ENDOSCOPY;  Service: Endoscopy;  Laterality: N/A;  . HOLEP-LASER  ENUCLEATION OF THE PROSTATE WITH MORCELLATION N/A 07/11/2015   Procedure: HOLEP-LASER ENUCLEATION OF THE PROSTATE WITH MORCELLATION;  Surgeon: Hollice Espy, MD;  Location: [facility] ORS;  Service: Urology;  Laterality: N/A;  . TRANSURETHRAL RESECTION OF PROSTATE N/A 07/16/2017   Procedure: TRANSURETHRAL RESECTION OF THE PROSTATE (TURP);  Surgeon: Hollice Espy, MD;  Location: [facility] ORS;  Service: Urology;  Laterality: N/A;  . UMBILICAL HERNIA REPAIR  12/25/2016   Procedure: HERNIA REPAIR UMBILICAL ADULT;  Surgeon: Jules Husbands, MD;  Location: [facility] ORS;  Service: General;;    Prior to Admission medications   Medication Sig Start Date End Date Taking? Authorizing Provider  ACCU-CHEK AVIVA PLUS test strip  08/04/17   [provider]  acetaminophen (TYLENOL 8 HOUR ARTHRITIS PAIN) 650 MG CR tablet Take 650 mg by mouth every 8 (eight) hours as needed for pain.    [provider]  amLODipine (NORVASC) 10 MG tablet Take 10 mg by mouth daily.    [provider]  ampicillin (PRINCIPEN) 500 MG capsule  08/27/17   [provider]  cetirizine (ZYRTEC) 10 MG tablet Take 10 mg by mouth daily.    [provider]  ciprofloxacin (CIPRO) 500 MG tablet Take 1 tablet (500 mg total) by mouth every 12 (twelve) hours. 08/26/17   Hollice Espy, MD  citalopram (CELEXA) 10 MG tablet Take 10 mg by mouth daily.    [provider]  docusate sodium (COLACE) 100 MG capsule Take 100 mg by mouth 2 (two) times daily.    [provider]  fluticasone (FLONASE) 50 MCG/ACT nasal spray Place 2 sprays into both nostrils daily.  08/31/13   [provider]  hydrALAZINE (APRESOLINE) 25 MG tablet  08/26/17   [provider]  hydrochlorothiazide (HYDRODIURIL) 25 MG tablet Take 25 mg by mouth daily.     [provider]  lisinopril (PRINIVIL,ZESTRIL) 40 MG tablet Take 40 mg by mouth daily.    [provider]  lovastatin (MEVACOR) 20 MG tablet Take 20 mg  by mouth at bedtime.    [provider]  metFORMIN (GLUCOPHAGE) 500 MG tablet Take 500 mg by mouth 2 (two) times daily.  04/05/15   [provider]  Meth-Hyo-M Bl-Na Phos-Ph Sal (URIBEL) 118 MG CAPS 1 tablet 4x daily Patient taking differently: Take 1 capsule by mouth 4 (four) times daily.  06/06/17   Zara Council A, PA-C  metoprolol (LOPRESSOR) 100 MG tablet Take 100 mg by mouth 2 (two) times daily.  05/10/13   [provider]  Multiple Vitamin (MULTIVITAMIN WITH MINERALS) TABS tablet Take 1 tablet by mouth daily. Centrum Silver Multivitamin    [provider]  nitrofurantoin, macrocrystal-monohydrate, (MACROBID) 100 MG capsule Take 1 capsule (100 mg total) by mouth every 12 (twelve) hours. 07/07/17   Hollice Espy, MD  potassium chloride (K-DUR) 10 MEQ tablet Take 10 mEq by mouth daily.  12/23/16   [provider]  potassium chloride (K-DUR,KLOR-CON) 10 MEQ tablet  08/25/17   [provider]  ranitidine (ZANTAC) 150 MG tablet Take 150 mg by mouth 2 (two) times daily.    [provider]  tamsulosin (FLOMAX) 0.4 MG CAPS capsule Take 1 capsule (0.4 mg total) by mouth daily after supper. 05/08/17   Nori Riis, PA-C    Family History  Problem Relation Age of Onset  . Cancer Mother        blood stream  . High blood pressure Mother   . Prostate cancer Neg Hx   . Bladder Cancer Neg Hx   . Kidney disease Neg Hx      Social History   Tobacco Use  . Smoking status: Former Smoker    Types: Cigarettes    Last attempt to quit: 03/26/1995    Years since quitting: 22.4  . Smokeless tobacco: Never Used  Substance Use Topics  . Alcohol use: No  . Drug use: No    Allergies as of 08/28/2017  . (No Known Allergies)    Review of Systems:    All systems reviewed and negative except where noted in HPI.   Physical Exam:  There were no vitals taken for this visit. No LMP for male patient. Psych:  Alert and cooperative. Normal  mood and affect. General:   Alert,  Well-developed, well-nourished, pleasant and cooperative in NAD Head:  Normocephalic and atraumatic. Eyes:  Sclera clear, no icterus.   Conjunctiva pink. Ears:  Normal auditory acuity. Nose:  No deformity, discharge, or lesions. Mouth:  No deformity or lesions,oropharynx pink & moist. Neck:  Supple; no masses or thyromegaly. Lungs:  Respirations even and unlabored.  Clear throughout to auscultation.   No wheezes, crackles, or rhonchi. No acute distress. Heart:  Regular rate and rhythm; no murmurs, clicks, rubs, or gallops. Abdomen:  Normal bowel sounds.  No bruits.  Soft,  non-tender and non-distended without masses, hepatosplenomegaly or hernias noted.  No guarding or rebound tenderness.    Neurologic:  Alert and oriented x3;  grossly normal neurologically. Skin:  Intact without significant lesions or rashes. No jaundice. Lymph Nodes:  No significant cervical adenopathy. Psych:  Alert and cooperative. Normal mood and affect.  Imaging Studies: No results found.  Assessment and Plan:   Winson Eichorn Quashie is a 72 y.o. y/o male has been referred for black tarry stool , microcytic anemia. No iron studies available. He has been on oral iron   Plan  1. Check Iron studies and if low refer for IV iron , check b12,folate 2. EGD+colonoscopy +/- capsule study of the small bowel    I have discussed alternative options, risks & benefits,  which include, but are not limited to, bleeding, infection, perforation,respiratory complication & drug reaction.  The patient agrees with this plan & written consent will be obtained.   '  Follow up in 6 weeks   Dr Brandon Bellows MD,MRCP(U.K)

## 2017-08-29 ENCOUNTER — Telehealth: Payer: Self-pay | Admitting: Gastroenterology

## 2017-08-29 ENCOUNTER — Other Ambulatory Visit: Payer: Self-pay

## 2017-08-29 DIAGNOSIS — D509 Iron deficiency anemia, unspecified: Secondary | ICD-10-CM

## 2017-08-29 NOTE — Progress Notes (Signed)
Patient currently taking antibiotics for 10 days.   Changed procedure date to follow medication.   Sent referral to Dr. Tasia Catchings.

## 2017-08-29 NOTE — Telephone Encounter (Signed)
Pt wife is calling to let Dr. Vicente Males know pt has infection and is taking  rx Ampociline 500 mg 4 times a day for 10 day  and pt is having procedure 6/13  She wants to make sure it is ok to take this rx.

## 2017-09-01 ENCOUNTER — Telehealth: Payer: Self-pay | Admitting: Gastroenterology

## 2017-09-01 LAB — CBC WITH DIFFERENTIAL/PLATELET
BASOS: 1 %
Basophils Absolute: 0 10*3/uL (ref 0.0–0.2)
EOS (ABSOLUTE): 0.2 10*3/uL (ref 0.0–0.4)
Eos: 3 %
HEMATOCRIT: 42.5 % (ref 37.5–51.0)
HEMOGLOBIN: 13.4 g/dL (ref 13.0–17.7)
IMMATURE GRANS (ABS): 0 10*3/uL (ref 0.0–0.1)
IMMATURE GRANULOCYTES: 0 %
LYMPHS: 30 %
Lymphocytes Absolute: 1.9 10*3/uL (ref 0.7–3.1)
MCH: 26 pg — ABNORMAL LOW (ref 26.6–33.0)
MCHC: 31.5 g/dL (ref 31.5–35.7)
MCV: 83 fL (ref 79–97)
Monocytes Absolute: 0.5 10*3/uL (ref 0.1–0.9)
Monocytes: 8 %
NEUTROS ABS: 3.6 10*3/uL (ref 1.4–7.0)
NEUTROS PCT: 58 %
Platelets: 214 10*3/uL (ref 150–450)
RBC: 5.15 x10E6/uL (ref 4.14–5.80)
RDW: 26.8 % — AB (ref 12.3–15.4)
WBC: 6.2 10*3/uL (ref 3.4–10.8)

## 2017-09-01 LAB — IRON,TIBC AND FERRITIN PANEL
FERRITIN: 20 ng/mL — AB (ref 30–400)
IRON SATURATION: 41 % (ref 15–55)
Iron: 172 ug/dL — ABNORMAL HIGH (ref 38–169)
TIBC: 416 ug/dL (ref 250–450)
UIBC: 244 ug/dL (ref 111–343)

## 2017-09-01 LAB — CELIAC DISEASE PANEL
Endomysial IgA: NEGATIVE
IgA/Immunoglobulin A, Serum: 201 mg/dL (ref 61–437)

## 2017-09-01 LAB — B12 AND FOLATE PANEL
Folate: >20 ng/mL (ref >3.0)
VITAMIN B 12: 635 pg/mL (ref 232–1245)

## 2017-09-01 NOTE — Telephone Encounter (Signed)
Pt wife left vm in regards to pt apt for colonoscopy he is scheduled for a shot on the day of procedure and she needs advise please call

## 2017-09-04 ENCOUNTER — Inpatient Hospital Stay: Payer: Medicare Other

## 2017-09-04 ENCOUNTER — Other Ambulatory Visit: Payer: Self-pay

## 2017-09-04 ENCOUNTER — Encounter: Payer: Self-pay | Admitting: Oncology

## 2017-09-04 ENCOUNTER — Inpatient Hospital Stay: Payer: Medicare Other | Attending: Oncology | Admitting: Oncology

## 2017-09-04 VITALS — BP 160/88 | HR 60 | Temp 96.9°F | Resp 18 | Ht 69.0 in | Wt 213.4 lb

## 2017-09-04 DIAGNOSIS — R5383 Other fatigue: Secondary | ICD-10-CM

## 2017-09-04 DIAGNOSIS — N400 Enlarged prostate without lower urinary tract symptoms: Secondary | ICD-10-CM | POA: Insufficient documentation

## 2017-09-04 DIAGNOSIS — F419 Anxiety disorder, unspecified: Secondary | ICD-10-CM | POA: Insufficient documentation

## 2017-09-04 DIAGNOSIS — K219 Gastro-esophageal reflux disease without esophagitis: Secondary | ICD-10-CM

## 2017-09-04 DIAGNOSIS — I10 Essential (primary) hypertension: Secondary | ICD-10-CM | POA: Insufficient documentation

## 2017-09-04 DIAGNOSIS — Z87891 Personal history of nicotine dependence: Secondary | ICD-10-CM | POA: Diagnosis not present

## 2017-09-04 DIAGNOSIS — E785 Hyperlipidemia, unspecified: Secondary | ICD-10-CM | POA: Diagnosis not present

## 2017-09-04 DIAGNOSIS — D509 Iron deficiency anemia, unspecified: Secondary | ICD-10-CM | POA: Diagnosis not present

## 2017-09-04 DIAGNOSIS — E119 Type 2 diabetes mellitus without complications: Secondary | ICD-10-CM | POA: Insufficient documentation

## 2017-09-04 DIAGNOSIS — D649 Anemia, unspecified: Secondary | ICD-10-CM

## 2017-09-04 DIAGNOSIS — R5381 Other malaise: Secondary | ICD-10-CM | POA: Insufficient documentation

## 2017-09-04 DIAGNOSIS — Z7984 Long term (current) use of oral hypoglycemic drugs: Secondary | ICD-10-CM | POA: Insufficient documentation

## 2017-09-04 DIAGNOSIS — Z79899 Other long term (current) drug therapy: Secondary | ICD-10-CM | POA: Insufficient documentation

## 2017-09-04 DIAGNOSIS — D500 Iron deficiency anemia secondary to blood loss (chronic): Secondary | ICD-10-CM

## 2017-09-04 HISTORY — DX: Iron deficiency anemia secondary to blood loss (chronic): D50.0

## 2017-09-04 LAB — CBC WITH DIFFERENTIAL/PLATELET
BASOS ABS: 0 10*3/uL (ref 0–0.1)
BASOS PCT: 1 %
EOS ABS: 0.2 10*3/uL (ref 0–0.7)
Eosinophils Relative: 4 %
HEMATOCRIT: 42.8 % (ref 40.0–52.0)
Hemoglobin: 14.1 g/dL (ref 13.0–18.0)
Lymphocytes Relative: 29 %
Lymphs Abs: 1.7 10*3/uL (ref 1.0–3.6)
MCH: 27.4 pg (ref 26.0–34.0)
MCHC: 32.8 g/dL (ref 32.0–36.0)
MCV: 83.4 fL (ref 80.0–100.0)
Monocytes Absolute: 0.5 10*3/uL (ref 0.2–1.0)
Monocytes Relative: 10 %
NEUTROS ABS: 3.2 10*3/uL (ref 1.4–6.5)
NEUTROS PCT: 56 %
Platelets: 179 10*3/uL (ref 150–440)
RBC: 5.13 MIL/uL (ref 4.40–5.90)
RDW: 27.8 % — AB (ref 11.5–14.5)
WBC: 5.7 10*3/uL (ref 3.8–10.6)

## 2017-09-04 LAB — IRON AND TIBC
Iron: 45 ug/dL (ref 45–182)
SATURATION RATIOS: 10 % — AB (ref 17.9–39.5)
TIBC: 449 ug/dL (ref 250–450)
UIBC: 404 ug/dL

## 2017-09-04 LAB — FERRITIN: FERRITIN: 13 ng/mL — AB (ref 24–336)

## 2017-09-04 NOTE — Addendum Note (Signed)
Addended by: Earlie Server on: 09/04/2017 08:07 PM   Modules accepted: Orders

## 2017-09-04 NOTE — Progress Notes (Signed)
Hematology/Oncology Consult note [facility] Telephone:([phone] Fax:([phone]   Patient Care Team: Letta Median, MD as PCP - General Rebeca Alert, Durene Cal, MD as Referring Physician (Family Medicine) Christene Lye, MD (General Surgery)  REFERRING PROVIDER: Dr.Ann CHIEF COMPLAINTS/REASON FOR VISIT:  Evaluation of microcytic anemia  HISTORY OF PRESENTING ILLNESS:  Brandon Benjamin is a  72 y.o.  male with PMH listed below who was referred to me for evaluation of microcytic anemia.  Recent labs suggest microcytic anemia with hemoglobin 8.1 and MCV of 68.  Reviewed patient's previous Lab work in the past reviewed showed a chronic history of anemia with baseline hemoglobin around 10. Patient reports having black stools, intermittently, duration for about past month.  Not associated with abdominal pain, diarrhea, constipation, change of bowel habits.  Patient does not use any NSAIDs, denies using any anticoagulation..  Last colonoscopy was about 2 years ago at [facility] and per patient was normal. She started back on oral iron supplementation for about a month.  Repeat CBC recently showed corrected hemoglobin of 13.  Denies any history of PRBC transfusion during the interval.  Patient has been scheduled for EGD and colonoscopy.  Refer to oncology for evaluation and future treatment plan.  Review of Systems  Constitutional: Positive for malaise/fatigue. Negative for chills, fever and weight loss.  HENT: Negative for congestion, ear discharge, ear pain, nosebleeds, sinus pain and sore throat.   Eyes: Negative for double vision, photophobia, pain, discharge and redness.  Respiratory: Negative for cough, hemoptysis, sputum production, shortness of breath and wheezing.   Cardiovascular: Negative for chest pain, palpitations, orthopnea, claudication and leg swelling.  Gastrointestinal: Negative for abdominal pain, blood in stool, constipation,  diarrhea, heartburn, melena, nausea and vomiting.  Genitourinary: Negative for dysuria, flank pain, frequency and hematuria.  Musculoskeletal: Negative for back pain, myalgias and neck pain.  Skin: Negative for itching and rash.  Neurological: Negative for dizziness, tingling, tremors, focal weakness, weakness and headaches.  Endo/Heme/Allergies: Negative for environmental allergies. Does not bruise/bleed easily.  Psychiatric/Behavioral: Negative for depression and hallucinations. The patient is not nervous/anxious.     MEDICAL HISTORY:  Past Medical History:  Diagnosis Date  . Anemia   . Anxiety   . Balanitis   . Balanitis   . BPH (benign prostatic hyperplasia)   . Diabetes ([place])   . Erectile dysfunction   . GERD (gastroesophageal reflux disease)   . History of kidney stones   . HLD (hyperlipidemia)   . HTN (hypertension)   . Over weight   . Priapism   . Scrotal cyst   . Urinary urgency     SURGICAL HISTORY: Past Surgical History:  Procedure Laterality Date  . CHOLECYSTECTOMY N/A 12/25/2016   Procedure: LAPAROSCOPIC CHOLECYSTECTOMY WITH INTRAOPERATIVE CHOLANGIOGRAM;  Surgeon: Jules Husbands, MD;  Location: [facility] ORS;  Service: General;  Laterality: N/A;  . CYSTOSCOPY WITH LITHOLAPAXY N/A 07/16/2017   Procedure: CYSTOSCOPY WITH LITHOLAPAXY;  Surgeon: Hollice Espy, MD;  Location: [facility] ORS;  Service: Urology;  Laterality: N/A;  . ERCP N/A 11/28/2016   Procedure: ENDOSCOPIC RETROGRADE CHOLANGIOPANCREATOGRAPHY (ERCP);  Surgeon: [name], MD;  Location: [facility] ENDOSCOPY;  Service: Endoscopy;  Laterality: N/A;  . HOLEP-LASER ENUCLEATION OF THE PROSTATE WITH MORCELLATION N/A 07/11/2015   Procedure: HOLEP-LASER ENUCLEATION OF THE PROSTATE WITH MORCELLATION;  Surgeon: Hollice Espy, MD;  Location: [facility] ORS;  Service: Urology;  Laterality: N/A;  . TRANSURETHRAL RESECTION OF PROSTATE N/A 07/16/2017   Procedure: TRANSURETHRAL RESECTION OF THE PROSTATE (  TURP);  Surgeon: Hollice Espy,  MD;  Location: [facility] ORS;  Service: Urology;  Laterality: N/A;  . UMBILICAL HERNIA REPAIR  12/25/2016   Procedure: HERNIA REPAIR UMBILICAL ADULT;  Surgeon: Jules Husbands, MD;  Location: [facility] ORS;  Service: General;;    SOCIAL HISTORY: Social History   Socioeconomic History  . Marital status: Married    Spouse name: Not on file  . Number of children: Not on file  . Years of education: Not on file  . Highest education level: Not on file  Occupational History  . Not on file  Social Needs  . Financial resource strain: Not on file  . Food insecurity:    Worry: Not on file    Inability: Not on file  . Transportation needs:    Medical: Not on file    Non-medical: Not on file  Tobacco Use  . Smoking status: Former Smoker    Types: Cigarettes    Last attempt to quit: 03/26/1995    Years since quitting: 22.4  . Smokeless tobacco: Never Used  Substance and Sexual Activity  . Alcohol use: No  . Drug use: No  . Sexual activity: Not on file  Lifestyle  . Physical activity:    Days per week: Not on file    Minutes per session: Not on file  . Stress: Not on file  Relationships  . Social connections:    Talks on phone: Not on file    Gets together: Not on file    Attends religious service: Not on file    Active member of club or organization: Not on file    Attends meetings of clubs or organizations: Not on file    Relationship status: Not on file  . Intimate partner violence:    Fear of current or ex partner: Not on file    Emotionally abused: Not on file    Physically abused: Not on file    Forced sexual activity: Not on file  Other Topics Concern  . Not on file  Social History Narrative  . Not on file    FAMILY HISTORY: Family History  Problem Relation Age of Onset  . Cancer Mother        blood stream  . High blood pressure Mother   . Hypertension Sister   . Anemia Sister   . Prostate cancer Neg Hx   . Bladder Cancer Neg Hx   . Kidney disease Neg Hx      ALLERGIES:  has No Known Allergies.  MEDICATIONS:  Current Outpatient Medications  Medication Sig Dispense Refill  . ACCU-CHEK AVIVA PLUS test strip     . acetaminophen (TYLENOL 8 HOUR ARTHRITIS PAIN) 650 MG CR tablet Take 650 mg by mouth every 8 (eight) hours as needed for pain.    Marland Kitchen amLODipine (NORVASC) 10 MG tablet Take 10 mg by mouth daily.    Marland Kitchen ampicillin (PRINCIPEN) 500 MG capsule     . cetirizine (ZYRTEC) 10 MG tablet Take 10 mg by mouth daily.    . ciprofloxacin (CIPRO) 500 MG tablet Take 1 tablet (500 mg total) by mouth every 12 (twelve) hours. 20 tablet 0  . citalopram (CELEXA) 10 MG tablet Take 10 mg by mouth daily.    Marland Kitchen docusate sodium (COLACE) 100 MG capsule Take 100 mg by mouth 2 (two) times daily.    . ferrous sulfate 325 (65 FE) MG tablet Take 325 mg by mouth 3 (three) times daily with meals.    Marland Kitchen  fluticasone (FLONASE) 50 MCG/ACT nasal spray Place 2 sprays into both nostrils daily.     . hydrALAZINE (APRESOLINE) 25 MG tablet     . lisinopril (PRINIVIL,ZESTRIL) 40 MG tablet Take 40 mg by mouth daily.    Marland Kitchen lovastatin (MEVACOR) 20 MG tablet Take 20 mg by mouth at bedtime.    . metFORMIN (GLUCOPHAGE) 500 MG tablet Take 500 mg by mouth 2 (two) times daily.     . metoprolol (LOPRESSOR) 100 MG tablet Take 100 mg by mouth 2 (two) times daily.     . Multiple Vitamin (MULTIVITAMIN WITH MINERALS) TABS tablet Take 1 tablet by mouth daily. Centrum Silver Multivitamin    . potassium chloride (K-DUR) 10 MEQ tablet Take 10 mEq by mouth daily.     . ranitidine (ZANTAC) 150 MG tablet Take 150 mg by mouth 2 (two) times daily.    . tamsulosin (FLOMAX) 0.4 MG CAPS capsule Take 1 capsule (0.4 mg total) by mouth daily after supper. 30 capsule 11  . hydrochlorothiazide (HYDRODIURIL) 25 MG tablet Take 25 mg by mouth daily.     . Meth-Hyo-M Bl-Na Phos-Ph Sal (URIBEL) 118 MG CAPS 1 tablet 4x daily (Patient not taking: Reported on 09/04/2017) 120 capsule 1  . nitrofurantoin,  macrocrystal-monohydrate, (MACROBID) 100 MG capsule Take 1 capsule (100 mg total) by mouth every 12 (twelve) hours. (Patient not taking: Reported on 08/28/2017) 20 capsule 0   No current facility-administered medications for this visit.      PHYSICAL EXAMINATION: ECOG PERFORMANCE STATUS: 1 - Symptomatic but completely ambulatory Vitals:   09/04/17 1458  BP: (!) 160/88  Pulse: 60  Resp: 18  Temp: (!) 96.9 F (36.1 C)  SpO2: 99%   Filed Weights   09/04/17 1458  Weight: 213 lb 6.4 oz (96.8 kg)    Physical Exam  Constitutional: He is oriented to person, place, and time. He appears well-developed and well-nourished. No distress.  HENT:  Head: Normocephalic and atraumatic.  Right Ear: External ear normal.  Left Ear: External ear normal.  Mouth/Throat: Oropharynx is clear and moist.  Eyes: Pupils are equal, round, and reactive to light. Conjunctivae and EOM are normal. No scleral icterus.  Neck: Normal range of motion. Neck supple.  Cardiovascular: Normal rate, regular rhythm and normal heart sounds.  Pulmonary/Chest: Effort normal and breath sounds normal. No respiratory distress. He has no wheezes. He has no rales. He exhibits no tenderness.  Abdominal: Soft. Bowel sounds are normal. He exhibits no distension and no mass. There is no tenderness.  Musculoskeletal: Normal range of motion. He exhibits no edema or deformity.  Lymphadenopathy:    He has no cervical adenopathy.  Neurological: He is alert and oriented to person, place, and time. No cranial nerve deficit. Coordination normal.  Skin: Skin is warm and dry. No rash noted.  Psychiatric: He has a normal mood and affect. His behavior is normal. Thought content normal.     LABORATORY DATA:  I have reviewed the data as listed Lab Results  Component Value Date   WBC 5.7 09/04/2017   HGB 14.1 09/04/2017   HCT 42.8 09/04/2017   MCV 83.4 09/04/2017   PLT 179 09/04/2017   Recent Labs    11/20/16 1525  12/26/16 0417  07/03/17 1524 07/16/17 1204 07/17/17 0455  NA 139   < > 136 139 139 137  K 3.1*   < > 3.4* 3.1* 3.5 3.5  CL 102  --  103 105  --  104  CO2 26  --  26 29  --  27  GLUCOSE 96   < > 162* 83 115* 137*  BUN 20  --  16 23*  --  17  CREATININE 1.24   < > 1.15 1.12  --  1.01  CALCIUM 10.3  --  8.8* 9.9  --  9.0  GFRNONAA 58*   < > >60 >60  --  >60  GFRAA >60   < > >60 >60  --  >60  PROT 7.5  --  6.5  --   --   --   ALBUMIN 4.2  --  3.6  --   --   --   AST 20  --  37  --   --   --   ALT 19  --  42  --   --   --   ALKPHOS 59  --  49  --   --   --   BILITOT 0.5  --  0.6  --   --   --    < > = values in this interval not displayed.   Iron/TIBC/Ferritin/ %Sat    Component Value Date/Time   IRON 45 09/04/2017 1535   IRON 172 (H) 08/28/2017 1428   TIBC 449 09/04/2017 1535   TIBC 416 08/28/2017 1428   FERRITIN 13 (L) 09/04/2017 1535   FERRITIN 20 (L) 08/28/2017 1428   IRONPCTSAT 10 (L) 09/04/2017 1535   IRONPCTSAT 41 08/28/2017 1428        ASSESSMENT & PLAN:  1. Iron deficiency anemia due to chronic blood loss    Repeat CBC showed improved hemoglobin level to 14.  I repeat iron TIBC and ferritin as the panel that was recently done on August 28, 2017 does not make sense.  At that time patient was shown to have a high TIBC, low ferritin but high normal iron saturation. Repeat iron panel today which showed low ferritin at 13, saturation ratio at 10.  Elevated TIBC 449, consistent with iron deficiency. Plan IV iron with Venofer 200mg  weekly for 4 doses. Allergy reactions/infusion reaction including anaphylactic reaction discussed with patient. Patient voices understanding and willing to proceed.  Orders Placed This Encounter  Procedures  . CBC with Differential/Platelet    Standing Status:   Future    Number of Occurrences:   1    Standing Expiration Date:   09/05/2018  . Iron and TIBC    Standing Status:   Future    Number of Occurrences:   1    Standing Expiration Date:   09/05/2018   . Ferritin    Standing Status:   Future    Number of Occurrences:   1    Standing Expiration Date:   09/05/2018  . CBC with Differential/Platelet    Standing Status:   Future    Standing Expiration Date:   09/05/2018  . Iron and TIBC    Standing Status:   Future    Standing Expiration Date:   09/05/2018  . Ferritin    Standing Status:   Future    Standing Expiration Date:   09/05/2018    All questions were answered. The patient knows to call the clinic with any problems questions or concerns.  Return of visit: 6 weeks for reassessment for additional need of IV iron. Thank you for this kind referral and the opportunity to participate in the care of this patient. A copy of today's note is routed to referring provider  Total face to face encounter time  for this patient visit was 40 min. >50% of the time was  spent in counseling and coordination of care.    Earlie Server, MD, PhD Hematology Oncology [facility] Pager- [phone] 09/04/2017

## 2017-09-04 NOTE — Progress Notes (Signed)
Patient here today for initial evaluation.  

## 2017-09-08 ENCOUNTER — Telehealth: Payer: Self-pay

## 2017-09-08 ENCOUNTER — Encounter: Payer: Self-pay | Admitting: Student

## 2017-09-08 NOTE — Telephone Encounter (Signed)
-----   Message from [name], MD sent at 09/07/2017  3:32 PM EDT ----- Dahlia Byes inform normal hb but still low iron but responding - continue oral iron

## 2017-09-08 NOTE — Telephone Encounter (Signed)
Advised patient of results per Dr. Vicente Males:  - inform normal hb but still low iron but responding - continue oral iron.

## 2017-09-09 ENCOUNTER — Encounter: Payer: Self-pay | Admitting: Anesthesiology

## 2017-09-09 ENCOUNTER — Ambulatory Visit: Payer: Medicare Other | Admitting: Anesthesiology

## 2017-09-09 ENCOUNTER — Encounter
Admission: RE | Disposition: A | Discharge status: Home / Self Care | Payer: Self-pay | Source: Ambulatory Visit | Attending: Gastroenterology

## 2017-09-09 ENCOUNTER — Ambulatory Visit
Admission: RE | Admit: 2017-09-09 | Discharge: 2017-09-09 | Disposition: A | Discharge status: Home / Self Care | Payer: Medicare Other | Source: Ambulatory Visit | Attending: Gastroenterology | Admitting: Gastroenterology

## 2017-09-09 DIAGNOSIS — K635 Polyp of colon: Secondary | ICD-10-CM | POA: Insufficient documentation

## 2017-09-09 DIAGNOSIS — N400 Enlarged prostate without lower urinary tract symptoms: Secondary | ICD-10-CM | POA: Insufficient documentation

## 2017-09-09 DIAGNOSIS — D122 Benign neoplasm of ascending colon: Secondary | ICD-10-CM

## 2017-09-09 DIAGNOSIS — D120 Benign neoplasm of cecum: Secondary | ICD-10-CM | POA: Diagnosis not present

## 2017-09-09 DIAGNOSIS — Z87891 Personal history of nicotine dependence: Secondary | ICD-10-CM | POA: Diagnosis not present

## 2017-09-09 DIAGNOSIS — I10 Essential (primary) hypertension: Secondary | ICD-10-CM | POA: Insufficient documentation

## 2017-09-09 DIAGNOSIS — Z7984 Long term (current) use of oral hypoglycemic drugs: Secondary | ICD-10-CM | POA: Diagnosis not present

## 2017-09-09 DIAGNOSIS — D509 Iron deficiency anemia, unspecified: Secondary | ICD-10-CM | POA: Diagnosis not present

## 2017-09-09 DIAGNOSIS — K5730 Diverticulosis of large intestine without perforation or abscess without bleeding: Secondary | ICD-10-CM | POA: Insufficient documentation

## 2017-09-09 DIAGNOSIS — Z6831 Body mass index (BMI) 31.0-31.9, adult: Secondary | ICD-10-CM | POA: Diagnosis not present

## 2017-09-09 DIAGNOSIS — K2950 Unspecified chronic gastritis without bleeding: Secondary | ICD-10-CM | POA: Diagnosis not present

## 2017-09-09 DIAGNOSIS — D125 Benign neoplasm of sigmoid colon: Secondary | ICD-10-CM | POA: Diagnosis not present

## 2017-09-09 DIAGNOSIS — K219 Gastro-esophageal reflux disease without esophagitis: Secondary | ICD-10-CM | POA: Insufficient documentation

## 2017-09-09 DIAGNOSIS — K449 Diaphragmatic hernia without obstruction or gangrene: Secondary | ICD-10-CM | POA: Insufficient documentation

## 2017-09-09 DIAGNOSIS — Z79899 Other long term (current) drug therapy: Secondary | ICD-10-CM | POA: Diagnosis not present

## 2017-09-09 DIAGNOSIS — N529 Male erectile dysfunction, unspecified: Secondary | ICD-10-CM | POA: Diagnosis not present

## 2017-09-09 DIAGNOSIS — K2210 Ulcer of esophagus without bleeding: Secondary | ICD-10-CM | POA: Diagnosis not present

## 2017-09-09 DIAGNOSIS — E119 Type 2 diabetes mellitus without complications: Secondary | ICD-10-CM | POA: Insufficient documentation

## 2017-09-09 DIAGNOSIS — E785 Hyperlipidemia, unspecified: Secondary | ICD-10-CM | POA: Diagnosis not present

## 2017-09-09 DIAGNOSIS — E669 Obesity, unspecified: Secondary | ICD-10-CM | POA: Insufficient documentation

## 2017-09-09 HISTORY — PX: ESOPHAGOGASTRODUODENOSCOPY (EGD) WITH PROPOFOL: SHX5813

## 2017-09-09 HISTORY — PX: COLONOSCOPY WITH PROPOFOL: SHX5780

## 2017-09-09 LAB — GLUCOSE, CAPILLARY: GLUCOSE-CAPILLARY: 140 mg/dL — AB (ref 65–99)

## 2017-09-09 SURGERY — ESOPHAGOGASTRODUODENOSCOPY (EGD) WITH PROPOFOL
Anesthesia: General

## 2017-09-09 MED ORDER — PROPOFOL 10 MG/ML IV BOLUS
INTRAVENOUS | Status: AC
  Filled 2017-09-09: qty 20

## 2017-09-09 MED ORDER — PROPOFOL 10 MG/ML IV BOLUS
INTRAVENOUS | Status: DC | PRN
  Administered 2017-09-09: 100 mg via INTRAVENOUS

## 2017-09-09 MED ORDER — LIDOCAINE HCL (PF) 1 % IJ SOLN
INTRAMUSCULAR | Status: AC
  Administered 2017-09-09: 0.3 mL via INTRADERMAL
  Filled 2017-09-09: qty 2

## 2017-09-09 MED ORDER — LIDOCAINE HCL (PF) 1 % IJ SOLN
2.0000 mL | Freq: Once | INTRAMUSCULAR | Status: AC
  Administered 2017-09-09: 0.3 mL via INTRADERMAL

## 2017-09-09 MED ORDER — PROPOFOL 500 MG/50ML IV EMUL
INTRAVENOUS | Status: DC | PRN
  Administered 2017-09-09: 100 ug/kg/min via INTRAVENOUS

## 2017-09-09 MED ORDER — FENTANYL CITRATE (PF) 100 MCG/2ML IJ SOLN
INTRAMUSCULAR | Status: AC
  Filled 2017-09-09: qty 2

## 2017-09-09 MED ORDER — SODIUM CHLORIDE 0.9 % IV SOLN
INTRAVENOUS | Status: DC
  Administered 2017-09-09: 1000 mL via INTRAVENOUS

## 2017-09-09 MED ORDER — LIDOCAINE HCL (CARDIAC) PF 100 MG/5ML IV SOSY
PREFILLED_SYRINGE | INTRAVENOUS | Status: DC | PRN
  Administered 2017-09-09: 40 mg via INTRAVENOUS

## 2017-09-09 NOTE — Anesthesia Preprocedure Evaluation (Addendum)
Anesthesia Evaluation  Patient identified by MRN, date of birth, ID band Patient awake    Reviewed: Allergy & Precautions, NPO status , Patient's Chart, lab work & pertinent test results, reviewed documented beta blocker date and time   Airway Mallampati: III  TM Distance: >3 FB     Dental  (+) Upper Dentures, Lower Dentures   Pulmonary former smoker,           Cardiovascular hypertension, Pt. on medications and Pt. on home beta blockers      Neuro/Psych Anxiety    GI/Hepatic GERD  ,  Endo/Other  diabetes, Type 2  Renal/GU Renal disease     Musculoskeletal   Abdominal   Peds  Hematology  (+) anemia ,   Anesthesia Other Findings Obese. EKG ok.  Reproductive/Obstetrics                            Anesthesia Physical Anesthesia Plan  ASA: III  Anesthesia Plan: General   Post-op Pain Management:    Induction: Intravenous  PONV Risk Score and Plan:   Airway Management Planned:   Additional Equipment:   Intra-op Plan:   Post-operative Plan:   Informed Consent: I have reviewed the patients History and Physical, chart, labs and discussed the procedure including the risks, benefits and alternatives for the proposed anesthesia with the patient or authorized representative who has indicated his/her understanding and acceptance.     Plan Discussed with: CRNA  Anesthesia Plan Comments:         Anesthesia Quick Evaluation

## 2017-09-09 NOTE — Op Note (Signed)
[facility] Patient Name: Brandon Benjamin Procedure Date: 09/09/2017 8:55 AM MRN: [id number] Account #: [id number] Date of Birth: [birth year] Admit Type: Outpatient Age: 72 Room: [facility] ENDO ROOM 1 Gender: Male Note Status: Finalized Procedure:            Upper GI endoscopy Indications:          Iron deficiency anemia Providers:            [name] MD, MD Referring MD:         Baxter Kail. Rebeca Alert MD, MD (Referring MD) Medicines:            Monitored Anesthesia Care Complications:        No immediate complications. Procedure:            Pre-Anesthesia Assessment:                       - Prior to the procedure, a History and Physical was                        performed, and patient medications, allergies and                        sensitivities were reviewed. The patient's tolerance of                        previous anesthesia was reviewed.                       - The risks and benefits of the procedure and the                        sedation options and risks were discussed with the                        patient. All questions were answered and informed                        consent was obtained.                       - ASA Grade Assessment: II - A patient with mild                        systemic disease.                       After obtaining informed consent, the endoscope was                        passed under direct vision. Throughout the procedure,                        the patient's blood pressure, pulse, and oxygen                        saturations were monitored continuously. The Endoscope                        was introduced through the mouth, and advanced to the  third part of duodenum. The upper GI endoscopy was                        accomplished with ease. The patient tolerated the                        procedure well. Findings:      The examined duodenum was normal.      The stomach was normal.      Two  superficial esophageal ulcers with no bleeding and no stigmata of       recent bleeding were found at the gastroesophageal junction (on       retroflexion). The largest lesion was 5 mm in largest dimension. This       was biopsied with a cold forceps for histology.      A 7 cm hiatal hernia was present.      The exam was otherwise without abnormality. Impression:           - Normal examined duodenum.                       - Normal stomach.                       - Non-bleeding esophageal ulcers. Biopsied.                       - 7 cm hiatal hernia.                       - The examination was otherwise normal. Recommendation:       - Await pathology results.                       - Perform a colonoscopy today. Procedure Code(s):    --- Professional ---                       [phone], Esophagogastroduodenoscopy, flexible, transoral;                        with biopsy, single or multiple Diagnosis Code(s):    --- Professional ---                       K22.10, Ulcer of esophagus without bleeding                       K44.9, Diaphragmatic hernia without obstruction or                        gangrene                       D50.9, Iron deficiency anemia, unspecified CPT copyright 2017 American Medical Association. All rights reserved. The codes documented in this report are preliminary and upon coder review [place]  be revised to meet current compliance requirements. [name], MD [name] MD, MD 09/09/2017 9:09:33 AM This report has been signed electronically. Number of Addenda: 0 Note Initiated On: 09/09/2017 8:55 AM      [facility]

## 2017-09-09 NOTE — Transfer of Care (Signed)
Immediate Anesthesia Transfer of Care Note  Patient: Brandon Benjamin  Procedure(s) Performed: ESOPHAGOGASTRODUODENOSCOPY (EGD) WITH PROPOFOL (N/A ) COLONOSCOPY WITH PROPOFOL (N/A )  Patient Location: PACU  Anesthesia Type:General  Level of Consciousness: awake, alert  and oriented  Airway & Oxygen Therapy: Patient Spontanous Breathing  Post-op Assessment: Report given to RN and Post -op Vital signs reviewed and stable  Post vital signs: Reviewed and stable  Last Vitals:  Vitals Value Taken Time  BP 119/74 09/09/2017  9:34 AM  Temp 36.1 C 09/09/2017  9:34 AM  Pulse 52 09/09/2017  9:38 AM  Resp 19 09/09/2017  9:38 AM  SpO2 94 % 09/09/2017  9:38 AM  Vitals shown include unvalidated device data.  Last Pain:  Vitals:   09/09/17 0934  TempSrc:   PainSc: 0-No pain         Complications: No apparent anesthesia complications

## 2017-09-09 NOTE — Op Note (Signed)
[facility] Patient Name: Brandon Benjamin Procedure Date: 09/09/2017 8:54 AM MRN: [id number] Account #: [id number] Date of Birth: Dec 04, 1945 Admit Type: Outpatient Age: 72 Room: [facility] ENDO ROOM 1 Gender: Male Note Status: Finalized Procedure:            Colonoscopy Indications:          Iron deficiency anemia Providers:            [name] MD, MD Referring MD:         Baxter Kail. Rebeca Alert MD, MD (Referring MD) Medicines:            Monitored Anesthesia Care Complications:        No immediate complications. Procedure:            Pre-Anesthesia Assessment:                       - Prior to the procedure, a History and Physical was                        performed, and patient medications, allergies and                        sensitivities were reviewed. The patient's tolerance of                        previous anesthesia was reviewed.                       - The risks and benefits of the procedure and the                        sedation options and risks were discussed with the                        patient. All questions were answered and informed                        consent was obtained.                       - ASA Grade Assessment: II - A patient with mild                        systemic disease.                       After obtaining informed consent, the colonoscope was                        passed under direct vision. Throughout the procedure,                        the patient's blood pressure, pulse, and oxygen                        saturations were monitored continuously. The                        Colonoscope was introduced through the anus and  advanced to the the cecum, identified by the                        appendiceal orifice, IC valve and transillumination.                        The colonoscopy was performed with ease. The patient                        tolerated the procedure well. The quality of the bowel                    preparation was good. Findings:      The perianal and digital rectal examinations were normal.      A 3 mm polyp was found in the cecum. The polyp was sessile. The polyp       was removed with a cold biopsy forceps. Resection and retrieval were       complete.      Four sessile polyps were found in the ascending colon. The polyps were 6       to 8 mm in size. These polyps were removed with a cold snare. Resection       and retrieval were complete.      A 5 mm polyp was found in the sigmoid colon. The polyp was sessile. The       polyp was removed with a cold snare. Resection and retrieval were       complete.      A single small-mouthed diverticulum was found in the sigmoid colon.      The exam was otherwise without abnormality on direct and retroflexion       views. Impression:           - One 3 mm polyp in the cecum, removed with a cold                        biopsy forceps. Resected and retrieved.                       - Four 6 to 8 mm polyps in the ascending colon, removed                        with a cold snare. Resected and retrieved.                       - One 5 mm polyp in the sigmoid colon, removed with a                        cold snare. Resected and retrieved.                       - Diverticulosis in the sigmoid colon.                       - The examination was otherwise normal on direct and                        retroflexion views. Recommendation:       - Discharge patient to home (with escort).                       -  Resume previous diet.                       - Continue present medications.                       - Await pathology results.                       - Repeat colonoscopy in 3 - 5 years for surveillance                        based on pathology results.                       - To visualize the small bowel, perform video capsule                        endoscopy in 2 weeks. Procedure Code(s):    --- Professional ---                        [phone], Colonoscopy, flexible; with removal of tumor(s),                        polyp(s), or other lesion(s) by snare technique                       45380, 45, Colonoscopy, flexible; with biopsy, single                        or multiple Diagnosis Code(s):    --- Professional ---                       D12.0, Benign neoplasm of cecum                       D12.5, Benign neoplasm of sigmoid colon                       D50.9, Iron deficiency anemia, unspecified                       D12.2, Benign neoplasm of ascending colon                       K57.30, Diverticulosis of large intestine without                        perforation or abscess without bleeding CPT copyright 2017 American Medical Association. All rights reserved. The codes documented in this report are preliminary and upon coder review Schauer  be revised to meet current compliance requirements. [name], MD [name] MD, MD 09/09/2017 9:32:31 AM This report has been signed electronically. Number of Addenda: 0 Note Initiated On: 09/09/2017 8:54 AM Scope Withdrawal Time: 0 hours 16 minutes 40 seconds  Total Procedure Duration: 0 hours 18 minutes 46 seconds       [facility]

## 2017-09-09 NOTE — H&P (Signed)
Brandon Bellows, MD [address] [place], [place], [place], [address] [address], [place], [place], [place], [address] Phone: [phone]  Fax: [phone]  Primary Care Physician:  Letta Median, MD   Pre-Procedure History & Physical: HPI:  Brandon Benjamin is a 72 y.o. male is here for an endoscopy and colonoscopy    Past Medical History:  Diagnosis Date  . Anemia   . Anxiety   . Balanitis   . Balanitis   . BPH (benign prostatic hyperplasia)   . Diabetes ([place])   . Erectile dysfunction   . GERD (gastroesophageal reflux disease)   . History of kidney stones   . HLD (hyperlipidemia)   . HTN (hypertension)   . Iron deficiency anemia due to chronic blood loss 09/04/2017  . Over weight   . Priapism   . Scrotal cyst   . Urinary urgency     Past Surgical History:  Procedure Laterality Date  . CHOLECYSTECTOMY N/A 12/25/2016   Procedure: LAPAROSCOPIC CHOLECYSTECTOMY WITH INTRAOPERATIVE CHOLANGIOGRAM;  Surgeon: Jules Husbands, MD;  Location: [facility] ORS;  Service: General;  Laterality: N/A;  . CYSTOSCOPY WITH LITHOLAPAXY N/A 07/16/2017   Procedure: CYSTOSCOPY WITH LITHOLAPAXY;  Surgeon: Hollice Espy, MD;  Location: [facility] ORS;  Service: Urology;  Laterality: N/A;  . ERCP N/A 11/28/2016   Procedure: ENDOSCOPIC RETROGRADE CHOLANGIOPANCREATOGRAPHY (ERCP);  Surgeon: [name], MD;  Location: [facility] ENDOSCOPY;  Service: Endoscopy;  Laterality: N/A;  . HOLEP-LASER ENUCLEATION OF THE PROSTATE WITH MORCELLATION N/A 07/11/2015   Procedure: HOLEP-LASER ENUCLEATION OF THE PROSTATE WITH MORCELLATION;  Surgeon: Hollice Espy, MD;  Location: [facility] ORS;  Service: Urology;  Laterality: N/A;  . TRANSURETHRAL RESECTION OF PROSTATE N/A 07/16/2017   Procedure: TRANSURETHRAL RESECTION OF THE PROSTATE (TURP);  Surgeon: Hollice Espy, MD;  Location: [facility] ORS;  Service: Urology;  Laterality: N/A;  . UMBILICAL HERNIA REPAIR  12/25/2016   Procedure: HERNIA REPAIR UMBILICAL ADULT;  Surgeon: Jules Husbands, MD;  Location: [facility] ORS;  Service: General;;    Prior to Admission medications   Medication Sig Start Date End Date Taking? Authorizing Provider  ACCU-CHEK AVIVA PLUS test strip  08/04/17   [provider]  acetaminophen (TYLENOL 8 HOUR ARTHRITIS PAIN) 650 MG CR tablet Take 650 mg by mouth every 8 (eight) hours as needed for pain.    [provider]  amLODipine (NORVASC) 10 MG tablet Take 10 mg by mouth daily.    [provider]  ampicillin (PRINCIPEN) 500 MG capsule  08/27/17   [provider]  cetirizine (ZYRTEC) 10 MG tablet Take 10 mg by mouth daily.    [provider]  ciprofloxacin (CIPRO) 500 MG tablet Take 1 tablet (500 mg total) by mouth every 12 (twelve) hours. 08/26/17   Hollice Espy, MD  citalopram (CELEXA) 10 MG tablet Take 10 mg by mouth daily.    [provider]  docusate sodium (COLACE) 100 MG capsule Take 100 mg by mouth 2 (two) times daily.    [provider]  ferrous sulfate 325 (65 FE) MG tablet Take 325 mg by mouth 3 (three) times daily with meals.    [provider]  fluticasone (FLONASE) 50 MCG/ACT nasal spray Place 2 sprays into both nostrils daily.  08/31/13   [provider]  hydrALAZINE (APRESOLINE) 25 MG tablet  08/26/17   [provider]  hydrochlorothiazide (HYDRODIURIL) 25 MG tablet Take 25 mg by mouth daily.     [provider]  lisinopril (  PRINIVIL,ZESTRIL) 40 MG tablet Take 40 mg by mouth daily.    [provider]  lovastatin (MEVACOR) 20 MG tablet Take 20 mg by mouth at bedtime.    [provider]  metFORMIN (GLUCOPHAGE) 500 MG tablet Take 500 mg by mouth 2 (two) times daily.  04/05/15   [provider]  Meth-Hyo-M Bl-Na Phos-Ph Sal (URIBEL) 118 MG CAPS 1 tablet 4x daily Patient not taking: Reported on 09/04/2017 06/06/17   Zara Council A, PA-C  metoprolol (LOPRESSOR) 100 MG tablet Take 100 mg by mouth 2 (two) times daily.  05/10/13    [provider]  Multiple Vitamin (MULTIVITAMIN WITH MINERALS) TABS tablet Take 1 tablet by mouth daily. Centrum Silver Multivitamin    [provider]  nitrofurantoin, macrocrystal-monohydrate, (MACROBID) 100 MG capsule Take 1 capsule (100 mg total) by mouth every 12 (twelve) hours. Patient not taking: Reported on 08/28/2017 07/07/17   Hollice Espy, MD  potassium chloride (K-DUR) 10 MEQ tablet Take 10 mEq by mouth daily.  12/23/16   [provider]  ranitidine (ZANTAC) 150 MG tablet Take 150 mg by mouth 2 (two) times daily.    [provider]  tamsulosin (FLOMAX) 0.4 MG CAPS capsule Take 1 capsule (0.4 mg total) by mouth daily after supper. 05/08/17   Zara Council A, PA-C    Allergies as of 09/01/2017  . (No Known Allergies)    Family History  Problem Relation Age of Onset  . Cancer Mother        blood stream  . High blood pressure Mother   . Hypertension Sister   . Anemia Sister   . Prostate cancer Neg Hx   . Bladder Cancer Neg Hx   . Kidney disease Neg Hx     Social History   Socioeconomic History  . Marital status: Married    Spouse name: Not on file  . Number of children: Not on file  . Years of education: Not on file  . Highest education level: Not on file  Occupational History  . Not on file  Social Needs  . Financial resource strain: Not on file  . Food insecurity:    Worry: Not on file    Inability: Not on file  . Transportation needs:    Medical: Not on file    Non-medical: Not on file  Tobacco Use  . Smoking status: Former Smoker    Types: Cigarettes    Last attempt to quit: 03/26/1995    Years since quitting: 22.4  . Smokeless tobacco: Never Used  Substance and Sexual Activity  . Alcohol use: No  . Drug use: No  . Sexual activity: Not on file  Lifestyle  . Physical activity:    Days per week: Not on file    Minutes per session: Not on file  . Stress: Not on file  Relationships  . Social connections:    Talks  on phone: Not on file    Gets together: Not on file    Attends religious service: Not on file    Active member of club or organization: Not on file    Attends meetings of clubs or organizations: Not on file    Relationship status: Not on file  . Intimate partner violence:    Fear of current or ex partner: Not on file    Emotionally abused: Not on file    Physically abused: Not on file    Forced sexual activity: Not on file  Other Topics Concern  .  Not on file  Social History Narrative  . Not on file    Review of Systems: See HPI, otherwise negative ROS  Physical Exam: BP (!) 162/89   Pulse (!) 54   Temp (!) 96.6 F (35.9 C) (Tympanic)   Resp 17   Ht 5\' 9"  (1.753 m)   Wt 213 lb (96.6 kg)   SpO2 100%   BMI 31.45 kg/m  General:   Alert,  pleasant and cooperative in NAD Head:  Normocephalic and atraumatic. Neck:  Supple; no masses or thyromegaly. Lungs:  Clear throughout to auscultation, normal respiratory effort.    Heart:  +S1, +S2, Regular rate and rhythm, No edema. Abdomen:  Soft, nontender and nondistended. Normal bowel sounds, without guarding, and without rebound.   Neurologic:  Alert and  oriented x4;  grossly normal neurologically.  Impression/Plan: Brandon Benjamin is here for an endoscopy and colonoscopy  to be performed for  evaluation of iron deficiency anemia    Risks, benefits, limitations, and alternatives regarding endoscopy have been reviewed with the patient.  Questions have been answered.  All parties agreeable.   Brandon Bellows, MD  09/09/2017, 8:34 AM

## 2017-09-09 NOTE — Anesthesia Postprocedure Evaluation (Signed)
Anesthesia Post Note  Patient: Brandon Benjamin  Procedure(s) Performed: ESOPHAGOGASTRODUODENOSCOPY (EGD) WITH PROPOFOL (N/A ) COLONOSCOPY WITH PROPOFOL (N/A )  Patient location during evaluation: Endoscopy Anesthesia Type: General Level of consciousness: awake and alert Pain management: pain level controlled Vital Signs Assessment: post-procedure vital signs reviewed and stable Respiratory status: spontaneous breathing, nonlabored ventilation, respiratory function stable and patient connected to nasal cannula oxygen Cardiovascular status: blood pressure returned to baseline and stable Postop Assessment: no apparent nausea or vomiting Anesthetic complications: no     Last Vitals:  Vitals:   09/09/17 0944 09/09/17 0954  BP: 131/89 127/85  Pulse: (!) 56 (!) 50  Resp: 15 13  Temp:    SpO2: 95% 97%    Last Pain:  Vitals:   09/09/17 0954  TempSrc:   PainSc: 0-No pain                 Lennyn Bellanca S

## 2017-09-09 NOTE — Anesthesia Post-op Follow-up Note (Signed)
Anesthesia QCDR form completed.        

## 2017-09-10 ENCOUNTER — Encounter: Payer: Self-pay | Admitting: Gastroenterology

## 2017-09-10 LAB — SURGICAL PATHOLOGY

## 2017-09-10 NOTE — Addendum Note (Signed)
Addendum  created 09/10/17 1022 by Nelda Marseille, CRNA   Charge Capture section accepted

## 2017-09-11 ENCOUNTER — Inpatient Hospital Stay: Payer: Medicare Other

## 2017-09-11 VITALS — BP 153/84 | HR 60 | Temp 97.3°F | Resp 18

## 2017-09-11 DIAGNOSIS — D509 Iron deficiency anemia, unspecified: Secondary | ICD-10-CM | POA: Diagnosis not present

## 2017-09-11 DIAGNOSIS — D500 Iron deficiency anemia secondary to blood loss (chronic): Secondary | ICD-10-CM

## 2017-09-11 MED ORDER — IRON SUCROSE 20 MG/ML IV SOLN
200.0000 mg | Freq: Once | INTRAVENOUS | Status: AC
  Administered 2017-09-11: 200 mg via INTRAVENOUS
  Filled 2017-09-11: qty 10

## 2017-09-11 MED ORDER — SODIUM CHLORIDE 0.9 % IV SOLN
Freq: Once | INTRAVENOUS | Status: AC
  Administered 2017-09-11: 14:00:00 via INTRAVENOUS
  Filled 2017-09-11: qty 1000

## 2017-09-16 ENCOUNTER — Telehealth: Payer: Self-pay

## 2017-09-16 NOTE — Telephone Encounter (Signed)
Advised patient of results per Dr. Vicente Males.   - 1. Tubular adenomas - repeat colonoscopy in 3 years  2. GE junction bx shows only inflammation from reflux- suggest repeat EGD in 8 weeks after omeprazole 40 mg once daily   Sent letter reminding patient to schedule EGD.

## 2017-09-16 NOTE — Telephone Encounter (Signed)
-----   Message from [name], MD sent at 09/14/2017  8:28 PM EDT ----- Brandon Benjamin   1. Tubular adenomas - repeat colonoscopy in 3 years 2. GE junction bx shows only inflammation from reflux- suggest repeat EGD in 8 weeks after omeprazole 40 mg once daily

## 2017-09-18 ENCOUNTER — Inpatient Hospital Stay: Payer: Medicare Other

## 2017-09-18 VITALS — BP 147/83 | HR 59 | Temp 96.9°F | Resp 18

## 2017-09-18 DIAGNOSIS — D500 Iron deficiency anemia secondary to blood loss (chronic): Secondary | ICD-10-CM

## 2017-09-18 DIAGNOSIS — D509 Iron deficiency anemia, unspecified: Secondary | ICD-10-CM | POA: Diagnosis not present

## 2017-09-18 MED ORDER — SODIUM CHLORIDE 0.9 % IV SOLN
Freq: Once | INTRAVENOUS | Status: AC
Start: 2017-09-18 — End: 2017-09-18
  Administered 2017-09-18: 14:00:00 via INTRAVENOUS
  Filled 2017-09-18: qty 1000

## 2017-09-18 MED ORDER — IRON SUCROSE 20 MG/ML IV SOLN
200.0000 mg | Freq: Once | INTRAVENOUS | Status: AC
  Administered 2017-09-18: 200 mg via INTRAVENOUS
  Filled 2017-09-18: qty 10

## 2017-09-22 ENCOUNTER — Other Ambulatory Visit: Payer: Self-pay

## 2017-09-22 DIAGNOSIS — K2210 Ulcer of esophagus without bleeding: Secondary | ICD-10-CM

## 2017-09-22 MED ORDER — OMEPRAZOLE 40 MG PO CPDR: 40.0000 mg | DELAYED_RELEASE_CAPSULE | Freq: Every day | ORAL | 3 refills | Status: DC

## 2017-09-24 ENCOUNTER — Ambulatory Visit: Payer: Medicare Other

## 2017-10-01 ENCOUNTER — Inpatient Hospital Stay: Payer: Medicare Other | Attending: Oncology

## 2017-10-01 VITALS — BP 153/82 | HR 87 | Temp 97.3°F | Resp 20

## 2017-10-01 DIAGNOSIS — E785 Hyperlipidemia, unspecified: Secondary | ICD-10-CM | POA: Insufficient documentation

## 2017-10-01 DIAGNOSIS — N400 Enlarged prostate without lower urinary tract symptoms: Secondary | ICD-10-CM | POA: Insufficient documentation

## 2017-10-01 DIAGNOSIS — I10 Essential (primary) hypertension: Secondary | ICD-10-CM | POA: Insufficient documentation

## 2017-10-01 DIAGNOSIS — F419 Anxiety disorder, unspecified: Secondary | ICD-10-CM | POA: Diagnosis not present

## 2017-10-01 DIAGNOSIS — Z87442 Personal history of urinary calculi: Secondary | ICD-10-CM | POA: Insufficient documentation

## 2017-10-01 DIAGNOSIS — K2210 Ulcer of esophagus without bleeding: Secondary | ICD-10-CM | POA: Insufficient documentation

## 2017-10-01 DIAGNOSIS — D500 Iron deficiency anemia secondary to blood loss (chronic): Secondary | ICD-10-CM | POA: Diagnosis not present

## 2017-10-01 DIAGNOSIS — K449 Diaphragmatic hernia without obstruction or gangrene: Secondary | ICD-10-CM | POA: Diagnosis not present

## 2017-10-01 DIAGNOSIS — K219 Gastro-esophageal reflux disease without esophagitis: Secondary | ICD-10-CM | POA: Diagnosis not present

## 2017-10-01 DIAGNOSIS — Z79899 Other long term (current) drug therapy: Secondary | ICD-10-CM | POA: Insufficient documentation

## 2017-10-01 DIAGNOSIS — Z87891 Personal history of nicotine dependence: Secondary | ICD-10-CM | POA: Diagnosis not present

## 2017-10-01 DIAGNOSIS — Z7984 Long term (current) use of oral hypoglycemic drugs: Secondary | ICD-10-CM | POA: Insufficient documentation

## 2017-10-01 MED ORDER — SODIUM CHLORIDE 0.9 % IV SOLN
Freq: Once | INTRAVENOUS | Status: AC
  Administered 2017-10-01: 14:00:00 via INTRAVENOUS
  Filled 2017-10-01: qty 1000

## 2017-10-01 MED ORDER — IRON SUCROSE 20 MG/ML IV SOLN
200.0000 mg | Freq: Once | INTRAVENOUS | Status: AC
  Administered 2017-10-01: 200 mg via INTRAVENOUS
  Filled 2017-10-01: qty 10

## 2017-10-02 ENCOUNTER — Ambulatory Visit (INDEPENDENT_AMBULATORY_CARE_PROVIDER_SITE_OTHER): Payer: Medicare Other | Admitting: Gastroenterology

## 2017-10-02 ENCOUNTER — Other Ambulatory Visit: Payer: Self-pay

## 2017-10-02 ENCOUNTER — Encounter: Payer: Self-pay | Admitting: Gastroenterology

## 2017-10-02 VITALS — BP 137/87 | HR 60 | Resp 17 | Ht 69.0 in | Wt 214.2 lb

## 2017-10-02 DIAGNOSIS — K2210 Ulcer of esophagus without bleeding: Secondary | ICD-10-CM

## 2017-10-02 DIAGNOSIS — D509 Iron deficiency anemia, unspecified: Secondary | ICD-10-CM | POA: Diagnosis not present

## 2017-10-02 NOTE — Progress Notes (Signed)
[name] MD, MRCP(U.K) [address]  [place]  [place], [place] [address]  Main: [phone]  Fax: [phone]   Primary Care Physician: Letta Median, MD  Primary Gastroenterologist:  Dr. [name]   Chief Complaint  Patient presents with  . Follow-up    EGD/Colonoscopy results    HPI: Brandon Benjamin is a 72 y.o. male   Summary of history :  He was initially seen  On 08/28/17 for microcytic anemia, melena for 1 month.  No NSAID's. He has had a colonoscopy in [facility] 2 years back and was normal. Denies use of any blood thinners. He has been on iron for a month .    Interval history   08/28/2017-  10/02/2017  B12,folate  normal   09/09/17- Colonoscopy : 6 tiny polypsx 4 adenomas  were excised  EGD - 7 cm hiatal hernia- edematous appearing GE junction.   Received IV iron taking PPI, doing well , feels more energetic, no new complaints Iron/TIBC/Ferritin/ %Sat    Component Value Date/Time   IRON 45 09/04/2017 1535   IRON 172 (H) 08/28/2017 1428   TIBC 449 09/04/2017 1535   TIBC 416 08/28/2017 1428   FERRITIN 13 (L) 09/04/2017 1535   FERRITIN 20 (L) 08/28/2017 1428   IRONPCTSAT 10 (L) 09/04/2017 1535   IRONPCTSAT 41 08/28/2017 1428    CBC Latest Ref Rng & Units 09/04/2017 08/28/2017 07/17/2017  WBC 3.8 - 10.6 K/uL 5.7 6.2 7.7  Hemoglobin 13.0 - 18.0 g/dL 14.1 13.4 8.1(L)  Hematocrit 40.0 - 52.0 % 42.8 42.5 26.8(L)  Platelets 150 - 440 K/uL 179 214 159        Current Outpatient Medications  Medication Sig Dispense Refill  . ACCU-CHEK AVIVA PLUS test strip     . ACCU-CHEK SOFTCLIX LANCETS lancets     . acetaminophen (TYLENOL 8 HOUR ARTHRITIS PAIN) 650 MG CR tablet Take 650 mg by mouth every 8 (eight) hours as needed for pain.    [place] Kitchen amLODipine (NORVASC) 10 MG tablet Take 10 mg by mouth daily.    . cetirizine (ZYRTEC) 10 MG tablet Take 10 mg by mouth daily.    . ciprofloxacin (CIPRO) 500 MG tablet Take 1 tablet (500 mg total) by mouth every 12  (twelve) hours. 20 tablet 0  . citalopram (CELEXA) 10 MG tablet Take 10 mg by mouth daily.    Mar[place] Kitchen docusate sodium (COLACE) 100 MG capsule Take 100 mg by mouth 2 (two) times daily.    . ferrous sulfate 325 (65 FE) MG tablet Take 325 mg by mouth 3 (three) times daily with meals.    . fluticasone (FLONASE) 50 MCG/ACT nasal spray Place 2 sprays into both nostrils daily.     . hydrALAZINE (APRESOLINE) 25 MG tablet     . hydrochlorothiazide (HYDRODIURIL) 25 MG tablet Take 25 mg by mouth daily.     Mar[place] Kitchen lisinopril (PRINIVIL,ZESTRIL) 40 MG tablet Take 40 mg by mouth daily.    Mar[place] Kitchen lovastatin (MEVACOR) 20 MG tablet Take 20 mg by mouth at bedtime.    . metFORMIN (GLUCOPHAGE) 500 MG tablet Take 500 mg by mouth 2 (two) times daily.     . Meth-Hyo-M Bl-Na Phos-Ph Sal (URIBEL) 118 MG CAPS 1 tablet 4x daily 120 capsule 1  . metoprolol (LOPRESSOR) 100 MG tablet Take 100 mg by mouth 2 (two) times daily.     . Multiple Vitamin (MULTIVITAMIN WITH MINERALS) TABS tablet Take 1 tablet by mouth daily. Centrum Silver Multivitamin    .  omeprazole (PRILOSEC) 40 MG capsule Take 1 capsule (40 mg total) by mouth daily. 90 capsule 3  . potassium chloride (K-DUR) 10 MEQ tablet Take 10 mEq by mouth daily.     . tamsulosin (FLOMAX) 0.4 MG CAPS capsule Take 1 capsule (0.4 mg total) by mouth daily after supper. 30 capsule 11  . ampicillin (PRINCIPEN) 500 MG capsule     . nitrofurantoin, macrocrystal-monohydrate, (MACROBID) 100 MG capsule Take 1 capsule (100 mg total) by mouth every 12 (twelve) hours. (Patient not taking: Reported on 08/28/2017) 20 capsule 0  . ranitidine (ZANTAC) 150 MG tablet Take 150 mg by mouth 2 (two) times daily.     No current facility-administered medications for this visit.     Allergies as of 10/02/2017  . (No Known Allergies)    ROS:  General: Negative for anorexia, weight loss, fever, chills, fatigue, weakness. ENT: Negative for hoarseness, difficulty swallowing , nasal congestion. CV: Negative for  chest pain, angina, palpitations, dyspnea on exertion, peripheral edema.  Respiratory: Negative for dyspnea at rest, dyspnea on exertion, cough, sputum, wheezing.  GI: See history of present illness. GU:  Negative for dysuria, hematuria, urinary incontinence, urinary frequency, nocturnal urination.  Endo: Negative for unusual weight change.    Physical Examination:   BP 137/87 (BP Location: Left Arm, Patient Position: Sitting, Cuff Size: Large)   Pulse 60   Resp 17   Ht 5\' 9"  (1.753 m)   Wt 214 lb 3.2 oz (97.2 kg)   BMI 31.63 kg/m   General: Well-nourished, well-developed in no acute distress.  Eyes: No icterus. Conjunctivae pink. Mouth: Oropharyngeal mucosa moist and pink , no lesions erythema or exudate. Lungs: Clear to auscultation bilaterally. Non-labored. Heart: Regular rate and rhythm, no murmurs rubs or gallops.  Abdomen: Bowel sounds are normal, nontender, nondistended, no hepatosplenomegaly or masses, no abdominal bruits or hernia , no rebound or guarding.   Extremities: No lower extremity edema. No clubbing or deformities. Neuro: Alert and oriented x 3.  Grossly intact. Skin: Warm and dry, no jaundice.   Psych: Alert and cooperative, normal mood and affect.   Imaging Studies: No results found.  Assessment and Plan:   Brandon Benjamin is a 72 y.o. y/o male here to follow up for iron deficiency anemia. EGD showed a large hiatal hernia and edematous GE junction with bx showing no malignancy. Colonoscopy showed a few colon polyps that were excised. Doing well on IV iron   Plan  1. Continue iron 2. Repeat EGD in 4 weeks to evaluate GE junction that appeared abnormal and placement of  capsule study of the small bowel    Risks, benefits, alternatives of Givens capsule discussed with patient to include but not limited to the rare risk of Given's capsule becoming lodged in the GI tract requiring surgical removal.  The patient agrees with this plan & consent will be obtained.     Dr [name]  MD,MRCP [facility]) Follow up in 3 months

## 2017-10-07 ENCOUNTER — Ambulatory Visit: Payer: Medicare Other | Admitting: Gastroenterology

## 2017-10-08 ENCOUNTER — Inpatient Hospital Stay: Payer: Medicare Other

## 2017-10-08 VITALS — BP 134/78 | HR 60 | Temp 97.8°F | Resp 18

## 2017-10-08 DIAGNOSIS — D500 Iron deficiency anemia secondary to blood loss (chronic): Secondary | ICD-10-CM

## 2017-10-08 MED ORDER — IRON SUCROSE 20 MG/ML IV SOLN
200.0000 mg | Freq: Once | INTRAVENOUS | Status: AC
  Administered 2017-10-08: 200 mg via INTRAVENOUS
  Filled 2017-10-08: qty 10

## 2017-10-08 MED ORDER — SODIUM CHLORIDE 0.9 % IV SOLN
Freq: Once | INTRAVENOUS | Status: AC
  Administered 2017-10-08: 14:00:00 via INTRAVENOUS
  Filled 2017-10-08: qty 1000

## 2017-10-13 ENCOUNTER — Inpatient Hospital Stay: Payer: Medicare Other

## 2017-10-13 DIAGNOSIS — D500 Iron deficiency anemia secondary to blood loss (chronic): Secondary | ICD-10-CM

## 2017-10-13 LAB — CBC WITH DIFFERENTIAL/PLATELET
Basophils Absolute: 0.1 10*3/uL (ref 0–0.1)
Basophils Relative: 1 %
EOS PCT: 4 %
Eosinophils Absolute: 0.2 10*3/uL (ref 0–0.7)
HEMATOCRIT: 45.1 % (ref 40.0–52.0)
Hemoglobin: 15.7 g/dL (ref 13.0–18.0)
LYMPHS PCT: 26 %
Lymphs Abs: 1.6 10*3/uL (ref 1.0–3.6)
MCH: 30.3 pg (ref 26.0–34.0)
MCHC: 34.8 g/dL (ref 32.0–36.0)
MCV: 87.2 fL (ref 80.0–100.0)
MONO ABS: 0.4 10*3/uL (ref 0.2–1.0)
MONOS PCT: 7 %
NEUTROS ABS: 3.7 10*3/uL (ref 1.4–6.5)
Neutrophils Relative %: 62 %
PLATELETS: 186 10*3/uL (ref 150–440)
RBC: 5.17 MIL/uL (ref 4.40–5.90)
RDW: 17.2 % — AB (ref 11.5–14.5)
WBC: 6 10*3/uL (ref 3.8–10.6)

## 2017-10-13 LAB — IRON AND TIBC
IRON: 94 ug/dL (ref 45–182)
Saturation Ratios: 25 % (ref 17.9–39.5)
TIBC: 383 ug/dL (ref 250–450)
UIBC: 289 ug/dL

## 2017-10-13 LAB — FERRITIN: FERRITIN: 142 ng/mL (ref 24–336)

## 2017-10-16 ENCOUNTER — Encounter: Payer: Self-pay | Admitting: Oncology

## 2017-10-16 ENCOUNTER — Inpatient Hospital Stay: Payer: Medicare Other

## 2017-10-16 ENCOUNTER — Inpatient Hospital Stay (HOSPITAL_BASED_OUTPATIENT_CLINIC_OR_DEPARTMENT_OTHER): Payer: Medicare Other | Admitting: Oncology

## 2017-10-16 VITALS — BP 157/88 | HR 57 | Temp 97.7°F | Resp 16 | Wt 213.7 lb

## 2017-10-16 DIAGNOSIS — K449 Diaphragmatic hernia without obstruction or gangrene: Secondary | ICD-10-CM

## 2017-10-16 DIAGNOSIS — Z7984 Long term (current) use of oral hypoglycemic drugs: Secondary | ICD-10-CM

## 2017-10-16 DIAGNOSIS — K2210 Ulcer of esophagus without bleeding: Secondary | ICD-10-CM | POA: Diagnosis not present

## 2017-10-16 DIAGNOSIS — N400 Enlarged prostate without lower urinary tract symptoms: Secondary | ICD-10-CM

## 2017-10-16 DIAGNOSIS — Z87891 Personal history of nicotine dependence: Secondary | ICD-10-CM

## 2017-10-16 DIAGNOSIS — E785 Hyperlipidemia, unspecified: Secondary | ICD-10-CM

## 2017-10-16 DIAGNOSIS — D500 Iron deficiency anemia secondary to blood loss (chronic): Secondary | ICD-10-CM | POA: Diagnosis not present

## 2017-10-16 DIAGNOSIS — K219 Gastro-esophageal reflux disease without esophagitis: Secondary | ICD-10-CM

## 2017-10-16 DIAGNOSIS — Z87442 Personal history of urinary calculi: Secondary | ICD-10-CM

## 2017-10-16 DIAGNOSIS — I10 Essential (primary) hypertension: Secondary | ICD-10-CM | POA: Diagnosis not present

## 2017-10-16 DIAGNOSIS — Z79899 Other long term (current) drug therapy: Secondary | ICD-10-CM

## 2017-10-16 NOTE — Progress Notes (Signed)
Hematology/Oncology follow up  [facility] [phone] [phone]   Patient Care Team: Letta Median, MD as PCP - General Rebeca Alert, Durene Cal, MD as Referring Physician (Family Medicine) Christene Lye, MD (General Surgery)  REFERRING PROVIDER: Dr.Anna REASON FOR VISIT Follow up for treatment of  Iron deficiency anemia  HISTORY OF PRESENTING ILLNESS:  Brandon Benjamin is a  72 y.o.  male with PMH listed below who was referred to me for evaluation of microcytic anemia.  Recent labs suggest microcytic anemia with hemoglobin 8.1 and MCV of 68.  Reviewed patient's previous Lab work in the past reviewed showed a chronic history of anemia with baseline hemoglobin around 10. Patient reports having black stools, intermittently, duration for about past month.  Not associated with abdominal pain, diarrhea, constipation, change of bowel habits.  Patient does not use any NSAIDs, denies using any anticoagulation..  Last colonoscopy was about 2 years ago at [facility] and per patient was normal. She started back on oral iron supplementation for about a month.  Repeat CBC recently showed corrected hemoglobin of 13.  Denies any history of PRBC transfusion during the interval.    INTERVAL HISTORY Brandon Benjamin is a 72 y.o. male who has above history reviewed by me today presents for follow up visit for management of iron deficiency anemia.  During the interval, patient has received IV venofer weekly x 4.  He subjectively feels fatigue is much better.  No new compliant.  During the interval, he had endoscopy done.  09/09/17- Colonoscopy : 6 tiny polypsx 4 adenomas  were excised  EGD - 2 esophageal ulcers with no bleedings. 7 cm hiatal hernia- edematous appearing GE junction.    Review of Systems  Constitutional: Negative for chills, fever, malaise/fatigue and weight loss.  HENT: Negative for congestion, ear discharge, ear pain, nosebleeds,  sinus pain and sore throat.   Eyes: Negative for double vision, photophobia and pain.  Respiratory: Negative for cough, hemoptysis and shortness of breath.   Cardiovascular: Negative for chest pain, palpitations, orthopnea and leg swelling.  Gastrointestinal: Negative for abdominal pain, blood in stool, constipation, diarrhea, heartburn, melena, nausea and vomiting.  Genitourinary: Negative for dysuria, flank pain, frequency and hematuria.  Musculoskeletal: Negative for back pain, myalgias and neck pain.  Skin: Negative for itching and rash.  Neurological: Negative for dizziness, tingling, tremors, focal weakness, weakness and headaches.  Endo/Heme/Allergies: Negative for environmental allergies. Does not bruise/bleed easily.  Psychiatric/Behavioral: Negative for depression and hallucinations. The patient is not nervous/anxious.     MEDICAL HISTORY:  Past Medical History:  Diagnosis Date  . Anemia   . Anxiety   . Balanitis   . Balanitis   . BPH (benign prostatic hyperplasia)   . Diabetes ([place])   . Erectile dysfunction   . GERD (gastroesophageal reflux disease)   . History of kidney stones   . HLD (hyperlipidemia)   . HTN (hypertension)   . Iron deficiency anemia due to chronic blood loss 09/04/2017  . Over weight   . Priapism   . Scrotal cyst   . Urinary urgency     SURGICAL HISTORY: Past Surgical History:  Procedure Laterality Date  . CHOLECYSTECTOMY N/A 12/25/2016   Procedure: LAPAROSCOPIC CHOLECYSTECTOMY WITH INTRAOPERATIVE CHOLANGIOGRAM;  Surgeon: Jules Husbands, MD;  Location: [facility] ORS;  Service: General;  Laterality: N/A;  . COLONOSCOPY WITH PROPOFOL N/A 09/09/2017   Procedure: COLONOSCOPY WITH PROPOFOL;  Surgeon: [name], MD;  Location: [facility] ENDOSCOPY;  Service:  Gastroenterology;  Laterality: N/A;  . CYSTOSCOPY WITH LITHOLAPAXY N/A 07/16/2017   Procedure: CYSTOSCOPY WITH LITHOLAPAXY;  Surgeon: Hollice Espy, MD;  Location: [facility] ORS;  Service: Urology;  Laterality:  N/A;  . ERCP N/A 11/28/2016   Procedure: ENDOSCOPIC RETROGRADE CHOLANGIOPANCREATOGRAPHY (ERCP);  Surgeon: [name], MD;  Location: [facility] ENDOSCOPY;  Service: Endoscopy;  Laterality: N/A;  . ESOPHAGOGASTRODUODENOSCOPY (EGD) WITH PROPOFOL N/A 09/09/2017   Procedure: ESOPHAGOGASTRODUODENOSCOPY (EGD) WITH PROPOFOL;  Surgeon: [name], MD;  Location[facility] ENDOSCOPY;  Service: Gastroenterology;  Laterality: N/A;  . HOLEP-LASER ENUCLEATION OF THE PROSTATE WITH MORCELLATION N/A 07/11/2015   Procedure: HOLEP-LASER ENUCLEATION OF THE PROSTATE WITH MORCELLATION;  Surgeon: Hollice Espy, MD;  Location: [facility] ORS;  Service: Urology;  Laterality: N/A;  . TRANSURETHRAL RESECTION OF PROSTATE N/A 07/16/2017   Procedure: TRANSURETHRAL RESECTION OF THE PROSTATE (TURP);  Surgeon: Hollice Espy, MD;  Location: [facility] ORS;  Service: Urology;  Laterality: N/A;  . UMBILICAL HERNIA REPAIR  12/25/2016   Procedure: HERNIA REPAIR UMBILICAL ADULT;  Surgeon: Jules Husbands, MD;  Location: [facility] ORS;  Service: General;;    SOCIAL HISTORY: Social History   Socioeconomic History  . Marital status: Married    Spouse name: Not on file  . Number of children: Not on file  . Years of education: Not on file  . Highest education level: Not on file  Occupational History  . Not on file  Social Needs  . Financial resource strain: Not on file  . Food insecurity:    Worry: Not on file    Inability: Not on file  . Transportation needs:    Medical: Not on file    Non-medical: Not on file  Tobacco Use  . Smoking status: Former Smoker    Types: Cigarettes    Last attempt to quit: 03/26/1995    Years since quitting: 22.5  . Smokeless tobacco: Never Used  Substance and Sexual Activity  . Alcohol use: No  . Drug use: No  . Sexual activity: Not on file  Lifestyle  . Physical activity:    Days per week: Not on file    Minutes per session: Not on file  . Stress: Not on file  Relationships  . Social connections:    Talks on  phone: Not on file    Gets together: Not on file    Attends religious service: Not on file    Active member of club or organization: Not on file    Attends meetings of clubs or organizations: Not on file    Relationship status: Not on file  . Intimate partner violence:    Fear of current or ex partner: Not on file    Emotionally abused: Not on file    Physically abused: Not on file    Forced sexual activity: Not on file  Other Topics Concern  . Not on file  Social History Narrative  . Not on file    FAMILY HISTORY: Family History  Problem Relation Age of Onset  . Cancer Mother        blood stream  . High blood pressure Mother   . Hypertension Sister   . Anemia Sister   . Prostate cancer Neg Hx   . Bladder Cancer Neg Hx   . Kidney disease Neg Hx     ALLERGIES:  has No Known Allergies.  MEDICATIONS:  Current Outpatient Medications  Medication Sig Dispense Refill  . ACCU-CHEK AVIVA PLUS test strip     . ACCU-CHEK SOFTCLIX LANCETS lancets     .  acetaminophen (TYLENOL 8 HOUR ARTHRITIS PAIN) 650 MG CR tablet Take 650 mg by mouth every 8 (eight) hours as needed for pain.    Marland Kitchen amLODipine (NORVASC) 10 MG tablet Take 10 mg by mouth daily.    Marland Kitchen ampicillin (PRINCIPEN) 500 MG capsule     . cetirizine (ZYRTEC) 10 MG tablet Take 10 mg by mouth daily.    . ciprofloxacin (CIPRO) 500 MG tablet Take 1 tablet (500 mg total) by mouth every 12 (twelve) hours. 20 tablet 0  . citalopram (CELEXA) 10 MG tablet Take 10 mg by mouth daily.    Marland Kitchen docusate sodium (COLACE) 100 MG capsule Take 100 mg by mouth 2 (two) times daily.    . ferrous sulfate 325 (65 FE) MG tablet Take 325 mg by mouth 3 (three) times daily with meals.    . fluticasone (FLONASE) 50 MCG/ACT nasal spray Place 2 sprays into both nostrils daily.     . hydrALAZINE (APRESOLINE) 25 MG tablet     . hydrochlorothiazide (HYDRODIURIL) 25 MG tablet Take 25 mg by mouth daily.     Marland Kitchen lisinopril (PRINIVIL,ZESTRIL) 40 MG tablet Take 40 mg by  mouth daily.    Marland Kitchen lovastatin (MEVACOR) 20 MG tablet Take 20 mg by mouth at bedtime.    . metFORMIN (GLUCOPHAGE) 500 MG tablet Take 500 mg by mouth 2 (two) times daily.     . Meth-Hyo-M Bl-Na Phos-Ph Sal (URIBEL) 118 MG CAPS 1 tablet 4x daily 120 capsule 1  . metoprolol (LOPRESSOR) 100 MG tablet Take 100 mg by mouth 2 (two) times daily.     . Multiple Vitamin (MULTIVITAMIN WITH MINERALS) TABS tablet Take 1 tablet by mouth daily. Centrum Silver Multivitamin    . nitrofurantoin, macrocrystal-monohydrate, (MACROBID) 100 MG capsule Take 1 capsule (100 mg total) by mouth every 12 (twelve) hours. 20 capsule 0  . omeprazole (PRILOSEC) 40 MG capsule Take 1 capsule (40 mg total) by mouth daily. 90 capsule 3  . potassium chloride (K-DUR) 10 MEQ tablet Take 10 mEq by mouth daily.     . ranitidine (ZANTAC) 150 MG tablet Take 150 mg by mouth 2 (two) times daily.    . tamsulosin (FLOMAX) 0.4 MG CAPS capsule Take 1 capsule (0.4 mg total) by mouth daily after supper. 30 capsule 11   No current facility-administered medications for this visit.      PHYSICAL EXAMINATION: ECOG PERFORMANCE STATUS: 1 - Symptomatic but completely ambulatory Vitals:   10/16/17 1400  BP: (!) 157/88  Pulse: (!) 57  Resp: 16  Temp: 97.7 F (36.5 C)   Filed Weights   10/16/17 1400  Weight: 213 lb 11.2 oz (96.9 kg)    Physical Exam  Constitutional: He is oriented to person, place, and time. He appears well-developed and well-nourished. No distress.  HENT:  Head: Normocephalic and atraumatic.  Right Ear: External ear normal.  Left Ear: External ear normal.  Mouth/Throat: Oropharynx is clear and moist.  Eyes: Pupils are equal, round, and reactive to light. Conjunctivae and EOM are normal. No scleral icterus.  Neck: Normal range of motion. Neck supple.  Cardiovascular: Normal rate, regular rhythm and normal heart sounds.  Pulmonary/Chest: Effort normal and breath sounds normal. No respiratory distress. He has no wheezes. He  has no rales. He exhibits no tenderness.  Abdominal: Soft. Bowel sounds are normal. He exhibits no distension and no mass. There is no tenderness.  Musculoskeletal: Normal range of motion. He exhibits no edema or deformity.  Lymphadenopathy:    He  has no cervical adenopathy.  Neurological: He is alert and oriented to person, place, and time. No cranial nerve deficit. Coordination normal.  Skin: Skin is warm and dry. No rash noted.  Psychiatric: He has a normal mood and affect. His behavior is normal. Thought content normal.     LABORATORY DATA:  I have reviewed the data as listed Lab Results  Component Value Date   WBC 6.0 10/13/2017   HGB 15.7 10/13/2017   HCT 45.1 10/13/2017   MCV 87.2 10/13/2017   PLT 186 10/13/2017   Recent Labs    11/20/16 1525  12/26/16 0417 07/03/17 1524 07/16/17 1204 07/17/17 0455  NA 139   < > 136 139 139 137  K 3.1*   < > 3.4* 3.1* 3.5 3.5  CL 102  --  103 105  --  104  CO2 26  --  26 29  --  27  GLUCOSE 96   < > 162* 83 115* 137*  BUN 20  --  16 23*  --  17  CREATININE 1.24   < > 1.15 1.12  --  1.01  CALCIUM 10.3  --  8.8* 9.9  --  9.0  GFRNONAA 58*   < > >60 >60  --  >60  GFRAA >60   < > >60 >60  --  >60  PROT 7.5  --  6.5  --   --   --   ALBUMIN 4.2  --  3.6  --   --   --   AST 20  --  37  --   --   --   ALT 19  --  42  --   --   --   ALKPHOS 59  --  49  --   --   --   BILITOT 0.5  --  0.6  --   --   --    < > = values in this interval not displayed.   Iron/TIBC/Ferritin/ %Sat    Component Value Date/Time   IRON 94 10/13/2017 1400   IRON 172 (H) 08/28/2017 1428   TIBC 383 10/13/2017 1400   TIBC 416 08/28/2017 1428   FERRITIN 142 10/13/2017 1400   FERRITIN 20 (L) 08/28/2017 1428   IRONPCTSAT 25 10/13/2017 1400   IRONPCTSAT 41 08/28/2017 1428        ASSESSMENT & PLAN:  1. Iron deficiency anemia due to chronic blood loss   2. Hiatal hernia   3. Esophageal ulcer without bleeding    S/p Venofer 200mg  weekly x 4.  Labs  reviewed, and discussed with patient. Hemoglobin and iron panel improved. Hold additional IV iron today.  Patient previously tolerates oral iron supplements well.  Advise patient to continue take ferrous sulfate 325mg  BID with meals for maintenance therapy.  Follow up with Dr.Anna for repeat EGDs as planned.   Orders Placed This Encounter  Procedures  . CBC with Differential/Platelet    Standing Status:   Future    Standing Expiration Date:   10/17/2018  . Ferritin    Standing Status:   Future    Standing Expiration Date:   10/17/2018  . Iron and TIBC    Standing Status:   Future    Standing Expiration Date:   10/17/2018    All questions were answered. The patient knows to call the clinic with any problems questions or concerns.  Return of visit:  3 months. Earlie Server, MD, PhD Hematology Oncology [facility] [facility] [place]- [phone] 10/16/2017

## 2017-11-13 ENCOUNTER — Encounter: Payer: Self-pay | Admitting: Podiatry

## 2017-11-13 ENCOUNTER — Ambulatory Visit (INDEPENDENT_AMBULATORY_CARE_PROVIDER_SITE_OTHER): Payer: Medicare Other | Admitting: Podiatry

## 2017-11-13 DIAGNOSIS — M79676 Pain in unspecified toe(s): Secondary | ICD-10-CM | POA: Diagnosis not present

## 2017-11-13 DIAGNOSIS — B351 Tinea unguium: Secondary | ICD-10-CM | POA: Diagnosis not present

## 2017-11-13 DIAGNOSIS — E119 Type 2 diabetes mellitus without complications: Secondary | ICD-10-CM | POA: Diagnosis not present

## 2017-11-13 NOTE — Progress Notes (Signed)
Complaint:  Visit Type: Patient returns to my office for continued preventative foot care services. Complaint: Patient states" my nails have grown long and thick and become painful to walk and wear shoes" Patient has been diagnosed with DM with no foot complications. The patient presents for preventative foot care services.   Podiatric Exam: Vascular: dorsalis pedis and posterior tibial pulses are palpable bilateral. Capillary return is immediate. Temperature gradient is WNL. Skin turgor WNL  Sensorium: Normal Semmes Weinstein monofilament test. Normal tactile sensation bilaterally. Nail Exam: Pt has thick disfigured discolored nails with subungual debris noted bilateral entire nail hallux through fifth toenails Ulcer Exam: There is no evidence of ulcer or pre-ulcerative changes or infection. Orthopedic Exam: Muscle tone and strength are WNL. No limitations in general ROM. No crepitus or effusions noted. Foot type and digits show no abnormalities. Bony prominences are unremarkable. Skin: No Porokeratosis. No infection or ulcers  Diagnosis:  Onychomycosis, , Pain in right toe, pain in left toes  Treatment & Plan Procedures and Treatment: Consent by patient was obtained for treatment procedures. The patient understood the discussion of treatment and procedures well. All questions were answered thoroughly reviewed. Debridement of mycotic and hypertrophic toenails, 1 through 5 bilateral and clearing of subungual debris. No ulceration, no infection noted.  Return Visit-Office Procedure: Patient instructed to return to the office for a follow up visit 3 months for continued evaluation and treatment.    Kaho Selle DPM 

## 2017-11-18 ENCOUNTER — Telehealth: Payer: Self-pay | Admitting: Gastroenterology

## 2017-11-18 NOTE — Telephone Encounter (Signed)
Pt wife left vm to see what rx pt is supposed to take in the morning

## 2017-11-21 NOTE — Telephone Encounter (Signed)
Spoke with pt wife, clarified preparation for EGD procedure.

## 2017-11-21 NOTE — Telephone Encounter (Signed)
Returned pt call regarding question about rx.  LVM to return call.

## 2017-12-01 ENCOUNTER — Encounter
Admission: RE | Disposition: A | Discharge status: Home / Self Care | Payer: Self-pay | Source: Ambulatory Visit | Attending: Gastroenterology

## 2017-12-01 ENCOUNTER — Ambulatory Visit: Payer: Medicare Other | Admitting: Anesthesiology

## 2017-12-01 ENCOUNTER — Ambulatory Visit
Admission: RE | Admit: 2017-12-01 | Discharge: 2017-12-01 | Disposition: A | Discharge status: Home / Self Care | Payer: Medicare Other | Source: Ambulatory Visit | Attending: Gastroenterology | Admitting: Gastroenterology

## 2017-12-01 DIAGNOSIS — Z7984 Long term (current) use of oral hypoglycemic drugs: Secondary | ICD-10-CM | POA: Insufficient documentation

## 2017-12-01 DIAGNOSIS — K219 Gastro-esophageal reflux disease without esophagitis: Secondary | ICD-10-CM | POA: Insufficient documentation

## 2017-12-01 DIAGNOSIS — D509 Iron deficiency anemia, unspecified: Secondary | ICD-10-CM | POA: Insufficient documentation

## 2017-12-01 DIAGNOSIS — N400 Enlarged prostate without lower urinary tract symptoms: Secondary | ICD-10-CM | POA: Insufficient documentation

## 2017-12-01 DIAGNOSIS — K449 Diaphragmatic hernia without obstruction or gangrene: Secondary | ICD-10-CM | POA: Diagnosis not present

## 2017-12-01 DIAGNOSIS — B3781 Candidal esophagitis: Secondary | ICD-10-CM | POA: Insufficient documentation

## 2017-12-01 DIAGNOSIS — Z87891 Personal history of nicotine dependence: Secondary | ICD-10-CM | POA: Diagnosis not present

## 2017-12-01 DIAGNOSIS — N4830 Priapism, unspecified: Secondary | ICD-10-CM | POA: Insufficient documentation

## 2017-12-01 DIAGNOSIS — E119 Type 2 diabetes mellitus without complications: Secondary | ICD-10-CM | POA: Diagnosis not present

## 2017-12-01 DIAGNOSIS — Z87442 Personal history of urinary calculi: Secondary | ICD-10-CM | POA: Insufficient documentation

## 2017-12-01 DIAGNOSIS — I10 Essential (primary) hypertension: Secondary | ICD-10-CM | POA: Diagnosis not present

## 2017-12-01 DIAGNOSIS — K317 Polyp of stomach and duodenum: Secondary | ICD-10-CM | POA: Insufficient documentation

## 2017-12-01 DIAGNOSIS — E785 Hyperlipidemia, unspecified: Secondary | ICD-10-CM | POA: Diagnosis not present

## 2017-12-01 DIAGNOSIS — Z79899 Other long term (current) drug therapy: Secondary | ICD-10-CM | POA: Insufficient documentation

## 2017-12-01 DIAGNOSIS — F419 Anxiety disorder, unspecified: Secondary | ICD-10-CM | POA: Diagnosis not present

## 2017-12-01 DIAGNOSIS — K2210 Ulcer of esophagus without bleeding: Secondary | ICD-10-CM

## 2017-12-01 HISTORY — PX: GIVENS CAPSULE STUDY: SHX5432

## 2017-12-01 HISTORY — PX: ESOPHAGOGASTRODUODENOSCOPY (EGD) WITH PROPOFOL: SHX5813

## 2017-12-01 LAB — KOH PREP: Special Requests: NORMAL

## 2017-12-01 LAB — GLUCOSE, CAPILLARY: GLUCOSE-CAPILLARY: 140 mg/dL — AB (ref 70–99)

## 2017-12-01 SURGERY — ESOPHAGOGASTRODUODENOSCOPY (EGD) WITH PROPOFOL
Anesthesia: General

## 2017-12-01 MED ORDER — PROPOFOL 500 MG/50ML IV EMUL
INTRAVENOUS | Status: AC
  Filled 2017-12-01: qty 50

## 2017-12-01 MED ORDER — FENTANYL CITRATE (PF) 100 MCG/2ML IJ SOLN: 25.0000 ug | INTRAMUSCULAR | Status: DC | PRN

## 2017-12-01 MED ORDER — SODIUM CHLORIDE 0.9 % IV SOLN
INTRAVENOUS | Status: DC
  Administered 2017-12-01: 08:00:00 via INTRAVENOUS

## 2017-12-01 MED ORDER — PROPOFOL 500 MG/50ML IV EMUL
INTRAVENOUS | Status: DC | PRN
  Administered 2017-12-01: 175 ug/kg/min via INTRAVENOUS

## 2017-12-01 MED ORDER — LIDOCAINE HCL (CARDIAC) PF 100 MG/5ML IV SOSY
PREFILLED_SYRINGE | INTRAVENOUS | Status: DC | PRN
  Administered 2017-12-01: 50 mg via INTRAVENOUS

## 2017-12-01 MED ORDER — PROPOFOL 10 MG/ML IV BOLUS
INTRAVENOUS | Status: DC | PRN
  Administered 2017-12-01: 70 mg via INTRAVENOUS

## 2017-12-01 MED ORDER — LIDOCAINE HCL (PF) 1 % IJ SOLN
2.0000 mL | Freq: Once | INTRAMUSCULAR | Status: AC
  Administered 2017-12-01: 0.3 mL via INTRADERMAL

## 2017-12-01 MED ORDER — LIDOCAINE HCL (PF) 1 % IJ SOLN
INTRAMUSCULAR | Status: AC
  Administered 2017-12-01: 0.3 mL via INTRADERMAL
  Filled 2017-12-01: qty 2

## 2017-12-01 MED ORDER — ONDANSETRON HCL 4 MG/2ML IJ SOLN: 4.0000 mg | Freq: Once | INTRAMUSCULAR | Status: DC | PRN

## 2017-12-01 NOTE — Transfer of Care (Signed)
Immediate Anesthesia Transfer of Care Note  Patient: Brandon Benjamin  Procedure(s) Performed: ESOPHAGOGASTRODUODENOSCOPY (EGD) WITH PROPOFOL (N/A ) GIVENS CAPSULE STUDY (N/A )  Patient Location: PACU  Anesthesia Type:General  Level of Consciousness: drowsy  Airway & Oxygen Therapy: Patient Spontanous Breathing and Patient connected to nasal cannula oxygen  Post-op Assessment: Report given to RN and Post -op Vital signs reviewed and stable  Post vital signs: Reviewed and stable  Last Vitals:  Vitals Value Taken Time  BP 114/95 12/01/2017  8:55 AM  Temp 35.6 C 12/01/2017  8:55 AM  Pulse 56 12/01/2017  8:56 AM  Resp 16 12/01/2017  8:56 AM  SpO2 96 % 12/01/2017  8:56 AM  Vitals shown include unvalidated device data.  Last Pain:  Vitals:   12/01/17 0855  TempSrc: Tympanic  PainSc: 0-No pain         Complications: No apparent anesthesia complications

## 2017-12-01 NOTE — Anesthesia Post-op Follow-up Note (Signed)
Anesthesia QCDR form completed.        

## 2017-12-01 NOTE — H&P (Signed)
[name], MD [address] [place], [place], [place], [address] [address], [place], [place], [place], [address] Phone: [phone]  Fax: [phone]  Primary Care Physician:  Letta Median, MD   Pre-Procedure History & Physical: HPI:  Brandon Benjamin is a 72 y.o. male is here for an endoscopy    Past Medical History:  Diagnosis Date  . Anemia   . Anxiety   . Balanitis   . Balanitis   . BPH (benign prostatic hyperplasia)   . Diabetes ([place])   . Erectile dysfunction   . GERD (gastroesophageal reflux disease)   . History of kidney stones   . HLD (hyperlipidemia)   . HTN (hypertension)   . Iron deficiency anemia due to chronic blood loss 09/04/2017  . Over weight   . Priapism   . Scrotal cyst   . Urinary urgency     Past Surgical History:  Procedure Laterality Date  . CHOLECYSTECTOMY N/A 12/25/2016   Procedure: LAPAROSCOPIC CHOLECYSTECTOMY WITH INTRAOPERATIVE CHOLANGIOGRAM;  Surgeon: Jules Husbands, MD;  Location: [facility] ORS;  Service: General;  Laterality: N/A;  . COLONOSCOPY WITH PROPOFOL N/A 09/09/2017   Procedure: COLONOSCOPY WITH PROPOFOL;  Surgeon: [name], MD;  Location: [facility] ENDOSCOPY;  Service: Gastroenterology;  Laterality: N/A;  . CYSTOSCOPY WITH LITHOLAPAXY N/A 07/16/2017   Procedure: CYSTOSCOPY WITH LITHOLAPAXY;  Surgeon: Hollice Espy, MD;  Location: [facility] ORS;  Service: Urology;  Laterality: N/A;  . ERCP N/A 11/28/2016   Procedure: ENDOSCOPIC RETROGRADE CHOLANGIOPANCREATOGRAPHY (ERCP);  Surgeon: [name], MD;  Location: [facility] ENDOSCOPY;  Service: Endoscopy;  Laterality: N/A;  . ESOPHAGOGASTRODUODENOSCOPY (EGD) WITH PROPOFOL N/A 09/09/2017   Procedure: ESOPHAGOGASTRODUODENOSCOPY (EGD) WITH PROPOFOL;  Surgeon: [name], MD;  Location: [facility] ENDOSCOPY;  Service: Gastroenterology;  Laterality: N/A;  . HOLEP-LASER ENUCLEATION OF THE PROSTATE WITH MORCELLATION N/A 07/11/2015   Procedure: HOLEP-LASER ENUCLEATION OF THE PROSTATE WITH MORCELLATION;   Surgeon: Hollice Espy, MD;  Location: [facility] ORS;  Service: Urology;  Laterality: N/A;  . TRANSURETHRAL RESECTION OF PROSTATE N/A 07/16/2017   Procedure: TRANSURETHRAL RESECTION OF THE PROSTATE (TURP);  Surgeon: Hollice Espy, MD;  Location: [facility] ORS;  Service: Urology;  Laterality: N/A;  . UMBILICAL HERNIA REPAIR  12/25/2016   Procedure: HERNIA REPAIR UMBILICAL ADULT;  Surgeon: Jules Husbands, MD;  Location: [facility] ORS;  Service: General;;    Prior to Admission medications   Medication Sig Start Date End Date Taking? Authorizing Provider  ACCU-CHEK AVIVA PLUS test strip  08/04/17   [provider]  ACCU-CHEK SOFTCLIX LANCETS lancets  09/26/17   [provider]  acetaminophen (TYLENOL 8 HOUR ARTHRITIS PAIN) 650 MG CR tablet Take 650 mg by mouth every 8 (eight) hours as needed for pain.    [provider]  amLODipine (NORVASC) 10 MG tablet Take 10 mg by mouth daily.    [provider]  cetirizine (ZYRTEC) 10 MG tablet Take 10 mg by mouth daily.    [provider]  citalopram (CELEXA) 10 MG tablet Take 10 mg by mouth daily.    [provider]  docusate sodium (COLACE) 100 MG capsule Take 100 mg by mouth 2 (two) times daily.    [provider]  ferrous sulfate 325 (65 FE) MG tablet Take 325 mg by mouth 3 (three) times daily with meals.    [provider]  fluticasone (FLONASE) 50 MCG/ACT nasal spray Place 2 sprays into both nostrils daily.  08/31/13   [provider]  hydrALAZINE (APRESOLINE)  25 MG tablet  08/26/17   [provider]  hydrochlorothiazide (HYDRODIURIL) 25 MG tablet Take 25 mg by mouth daily.     [provider]  lisinopril (PRINIVIL,ZESTRIL) 40 MG tablet Take 40 mg by mouth daily.    [provider]  lovastatin (MEVACOR) 20 MG tablet Take 20 mg by mouth at bedtime.    [provider]  metFORMIN (GLUCOPHAGE) 500 MG tablet Take 500 mg by mouth 2 (two) times daily.  04/05/15    [provider]  Meth-Hyo-M Bl-Na Phos-Ph Sal (URIBEL) 118 MG CAPS 1 tablet 4x daily 06/06/17   McGowan, Larene Beach A, PA-C  metoprolol (LOPRESSOR) 100 MG tablet Take 100 mg by mouth 2 (two) times daily.  05/10/13   [provider]  Multiple Vitamin (MULTIVITAMIN WITH MINERALS) TABS tablet Take 1 tablet by mouth daily. Centrum Silver Multivitamin    [provider]  omeprazole (PRILOSEC) 40 MG capsule Take 1 capsule (40 mg total) by mouth daily. 09/22/17   [name], MD  potassium chloride (K-DUR) 10 MEQ tablet Take 10 mEq by mouth daily.  12/23/16   [provider]  ranitidine (ZANTAC) 150 MG tablet Take 150 mg by mouth 2 (two) times daily.    [provider]  tamsulosin (FLOMAX) 0.4 MG CAPS capsule Take 1 capsule (0.4 mg total) by mouth daily after supper. 05/08/17   Zara Council A, PA-C    Allergies as of 09/22/2017  . (No Known Allergies)    Family History  Problem Relation Age of Onset  . Cancer Mother        blood stream  . High blood pressure Mother   . Hypertension Sister   . Anemia Sister   . Prostate cancer Neg Hx   . Bladder Cancer Neg Hx   . Kidney disease Neg Hx     Social History   Socioeconomic History  . Marital status: Married    Spouse name: Not on file  . Number of children: Not on file  . Years of education: Not on file  . Highest education level: Not on file  Occupational History  . Not on file  Social Needs  . Financial resource strain: Not on file  . Food insecurity:    Worry: Not on file    Inability: Not on file  . Transportation needs:    Medical: Not on file    Non-medical: Not on file  Tobacco Use  . Smoking status: Former Smoker    Types: Cigarettes    Last attempt to quit: 03/26/1995    Years since quitting: 22.7  . Smokeless tobacco: Never Used  Substance and Sexual Activity  . Alcohol use: No  . Drug use: No  . Sexual activity: Not on file  Lifestyle  . Physical activity:    Days per week:  Not on file    Minutes per session: Not on file  . Stress: Not on file  Relationships  . Social connections:    Talks on phone: Not on file    Gets together: Not on file    Attends religious service: Not on file    Active member of club or organization: Not on file    Attends meetings of clubs or organizations: Not on file    Relationship status: Not on file  . Intimate partner violence:    Fear of current or ex partner: Not on file    Emotionally abused: Not on file    Physically abused: Not on  file    Forced sexual activity: Not on file  Other Topics Concern  . Not on file  Social History Narrative  . Not on file    Review of Systems: See HPI, otherwise negative ROS  Physical Exam: BP (!) 162/93   Pulse (!) 57   Temp (!) 96.3 F (35.7 C) (Tympanic)   Resp 17   Ht 5\' 9"  (1.753 m)   Wt 95.7 kg   SpO2 98%   BMI 31.16 kg/m  General:   Alert,  pleasant and cooperative in NAD Head:  Normocephalic and atraumatic. Neck:  Supple; no masses or thyromegaly. Lungs:  Clear throughout to auscultation, normal respiratory effort.    Heart:  +S1, +S2, Regular rate and rhythm, No edema. Abdomen:  Soft, nontender and nondistended. Normal bowel sounds, without guarding, and without rebound.   Neurologic:  Alert and  oriented x4;  grossly normal neurologically.  Impression/Plan: Warrick Llera Wellnitz is here for an endoscopy  to be performed for  evaluation of esophageal ulcers, placement of a capsule to evaluate small bowel     Risks, benefits, limitations, and alternatives regarding endoscopy have been reviewed with the patient.  Questions have been answered.  All parties agreeable.   [name], MD  12/01/2017, 8:16 AM

## 2017-12-01 NOTE — Op Note (Signed)
[facility] Patient Name: Brandon Benjamin Procedure Date: 12/01/2017 8:20 AM MRN: [id number] Account #: [id number] Date of Birth: [birth year] Admit Type: Outpatient Age: 72 Room: [facility] ENDO ROOM 4 Gender: Male Note Status: Finalized Procedure:            Upper GI endoscopy Indications:          Iron deficiency anemia, GERD, Refractory to medical                        management Providers:            [name] MD, MD Referring MD:         Baxter Kail. Rebeca Alert MD, MD (Referring MD) Medicines:            Monitored Anesthesia Care Complications:        No immediate complications. Procedure:            Pre-Anesthesia Assessment:                       - Prior to the procedure, a History and Physical was                        performed, and patient medications, allergies and                        sensitivities were reviewed. The patient's tolerance of                        previous anesthesia was reviewed.                       - The risks and benefits of the procedure and the                        sedation options and risks were discussed with the                        patient. All questions were answered and informed                        consent was obtained.                       - ASA Grade Assessment: II - A patient with mild                        systemic disease.                       After obtaining informed consent, the endoscope was                        passed under direct vision. Throughout the procedure,                        the patient's blood pressure, pulse, and oxygen                        saturations were monitored continuously. The Endoscope  was introduced through the mouth, and advanced to the                        third part of duodenum. The upper GI endoscopy was                        accomplished with ease. The patient tolerated the                        procedure well. Findings:      The examined  duodenum was normal.      A 10 cm hiatal hernia was present.      A single 10 mm sessile polyp with no bleeding and no stigmata of recent       bleeding was found in the gastric body. This was biopsied with a cold       forceps for histology.      Patchy, white plaques were found in the middle third of the esophagus.       Brushings for KOH prep were obtained in the mid esophagus.      Capsule was placed in the stomach endoscopically due to presecne of       large hiatal hernia and ulceration seen previously. Impression:           - Normal examined duodenum.                       - 10 cm hiatal hernia.                       - A single gastric polyp. Biopsied.                       - Esophageal plaques were found, suspicious for                        candidiasis. Brushings performed. Recommendation:       - Discharge patient to home (with escort).                       - Resume previous diet.                       - Continue present medications.                       - Await pathology results.                       - F/u biopsies, if gastric polyp is adenoma or                        hyperplastic , will need resection . Procedure Code(s):    --- Professional ---                       [phone], Esophagogastroduodenoscopy, flexible, transoral;                        with biopsy, single or multiple Diagnosis Code(s):    --- Professional ---                       K44.9, Diaphragmatic  hernia without obstruction or                        gangrene                       K31.7, Polyp of stomach and duodenum                       D50.9, Iron deficiency anemia, unspecified                       K22.9, Disease of esophagus, unspecified                       K21.9, Gastro-esophageal reflux disease without                        esophagitis CPT copyright 2017 American Medical Association. All rights reserved. The codes documented in this report are preliminary and upon coder review Mask  be revised to  meet current compliance requirements. [name], MD [name] MD, MD 12/01/2017 8:54:03 AM This report has been signed electronically. Number of Addenda: 0 Note Initiated On: 12/01/2017 8:20 AM      [facility]

## 2017-12-01 NOTE — Anesthesia Postprocedure Evaluation (Signed)
Anesthesia Post Note  Patient: Brandon Benjamin  Procedure(s) Performed: ESOPHAGOGASTRODUODENOSCOPY (EGD) WITH PROPOFOL (N/A ) GIVENS CAPSULE STUDY (N/A )  Patient location during evaluation: PACU Anesthesia Type: General Level of consciousness: awake and alert Pain management: pain level controlled Vital Signs Assessment: post-procedure vital signs reviewed and stable Respiratory status: spontaneous breathing, nonlabored ventilation, respiratory function stable and patient connected to nasal cannula oxygen Cardiovascular status: blood pressure returned to baseline and stable Postop Assessment: no apparent nausea or vomiting Anesthetic complications: no     Last Vitals:  Vitals:   12/01/17 0855 12/01/17 0905  BP: (!) 114/95 119/81  Pulse: (!) 55 (!) 55  Resp: 19 13  Temp: (!) 35.6 C   SpO2: 97% 99%    Last Pain:  Vitals:   12/01/17 0905  TempSrc:   PainSc: 0-No pain                 Molli Barrows

## 2017-12-01 NOTE — Anesthesia Preprocedure Evaluation (Signed)
Anesthesia Evaluation  Patient identified by MRN, date of birth, ID band Patient awake    Reviewed: Allergy & Precautions, H&P , NPO status , Patient's Chart, lab work & pertinent test results, reviewed documented beta blocker date and time   Airway Mallampati: II   Neck ROM: full    Dental  (+) Poor Dentition   Pulmonary neg pulmonary ROS, former smoker,    Pulmonary exam normal        Cardiovascular hypertension, On Medications negative cardio ROS Normal cardiovascular exam Rhythm:regular Rate:Normal     Neuro/Psych Anxiety negative neurological ROS  negative psych ROS   GI/Hepatic negative GI ROS, Neg liver ROS, GERD  Medicated,  Endo/Other  negative endocrine ROSdiabetes  Renal/GU Renal diseasenegative Renal ROS  negative genitourinary   Musculoskeletal   Abdominal   Peds  Hematology negative hematology ROS (+) anemia ,   Anesthesia Other Findings Past Medical History: No date: Anemia No date: Anxiety No date: Balanitis No date: Balanitis No date: BPH (benign prostatic hyperplasia) No date: Diabetes (HCC) No date: Erectile dysfunction No date: GERD (gastroesophageal reflux disease) No date: History of kidney stones No date: HLD (hyperlipidemia) No date: HTN (hypertension) 09/04/2017: Iron deficiency anemia due to chronic blood loss No date: Over weight No date: Priapism No date: Scrotal cyst No date: Urinary urgency Past Surgical History: 12/25/2016: CHOLECYSTECTOMY; N/A     Comment:  Procedure: LAPAROSCOPIC CHOLECYSTECTOMY WITH               INTRAOPERATIVE CHOLANGIOGRAM;  Surgeon: Jules Husbands,               MD;  Location: [facility] ORS;  Service: General;  Laterality:               N/A; 09/09/2017: COLONOSCOPY WITH PROPOFOL; N/A     Comment:  Procedure: COLONOSCOPY WITH PROPOFOL;  Surgeon: [name], MD;  Location: [facility] ENDOSCOPY;  Service:               Gastroenterology;  Laterality:  N/A; 07/16/2017: CYSTOSCOPY WITH LITHOLAPAXY; N/A     Comment:  Procedure: CYSTOSCOPY WITH LITHOLAPAXY;  Surgeon:               Hollice Espy, MD;  Location: [facility] ORS;  Service:               Urology;  Laterality: N/A; 11/28/2016: ERCP; N/A     Comment:  Procedure: ENDOSCOPIC RETROGRADE               CHOLANGIOPANCREATOGRAPHY (ERCP);  Surgeon: [name],               MD;  Location: [facility] ENDOSCOPY;  Service: Endoscopy;                Laterality: N/A; 09/09/2017: ESOPHAGOGASTRODUODENOSCOPY (EGD) WITH PROPOFOL; N/A     Comment:  Procedure: ESOPHAGOGASTRODUODENOSCOPY (EGD) WITH               PROPOFOL;  Surgeon: [name], MD;  Location: [facility]               ENDOSCOPY;  Service: Gastroenterology;  Laterality: N/A; 07/11/2015: HOLEP-LASER ENUCLEATION OF THE PROSTATE WITH MORCELLATION;  N/A     Comment:  Procedure: HOLEP-LASER ENUCLEATION OF THE PROSTATE WITH               MORCELLATION;  Surgeon: Hollice Espy, MD;  Location:  [facility] ORS;  Service: Urology;  Laterality: N/A; 07/16/2017: TRANSURETHRAL RESECTION OF PROSTATE; N/A     Comment:  Procedure: TRANSURETHRAL RESECTION OF THE PROSTATE               (TURP);  Surgeon: Hollice Espy, MD;  Location: [facility]               ORS;  Service: Urology;  Laterality: N/A; 00/03/7492: UMBILICAL HERNIA REPAIR     Comment:  Procedure: HERNIA REPAIR UMBILICAL ADULT;  Surgeon:               Jules Husbands, MD;  Location: [facility] ORS;  Service:               General;;   Reproductive/Obstetrics negative OB ROS                             Anesthesia Physical Anesthesia Plan  ASA: III  Anesthesia Plan: General   Post-op Pain Management:    Induction:   PONV Risk Score and Plan:   Airway Management Planned:   Additional Equipment:   Intra-op Plan:   Post-operative Plan:   Informed Consent: I have reviewed the patients History and Physical, chart, labs and discussed the procedure including the risks, benefits  and alternatives for the proposed anesthesia with the patient or authorized representative who has indicated his/her understanding and acceptance.   Dental Advisory Given  Plan Discussed with: CRNA  Anesthesia Plan Comments:         Anesthesia Quick Evaluation

## 2017-12-01 NOTE — Anesthesia Procedure Notes (Signed)
Date/Time: 12/01/2017 8:34 AM Performed by: Johnna Acosta, CRNA Pre-anesthesia Checklist: Patient identified, Emergency Drugs available, Suction available, Patient being monitored and Timeout performed Patient Re-evaluated:Patient Re-evaluated prior to induction Oxygen Delivery Method: Nasal cannula Preoxygenation: Pre-oxygenation with 100% oxygen

## 2017-12-02 ENCOUNTER — Encounter: Payer: Self-pay | Admitting: Gastroenterology

## 2017-12-03 LAB — SURGICAL PATHOLOGY

## 2017-12-09 ENCOUNTER — Telehealth: Payer: Self-pay

## 2017-12-09 ENCOUNTER — Other Ambulatory Visit: Payer: Self-pay

## 2017-12-09 DIAGNOSIS — K317 Polyp of stomach and duodenum: Secondary | ICD-10-CM

## 2017-12-09 MED ORDER — FLUCONAZOLE 200 MG PO TABS: 200.0000 mg | ORAL_TABLET | Freq: Every day | ORAL | 0 refills | Status: AC

## 2017-12-09 NOTE — Telephone Encounter (Signed)
Spoke with pt about biopsy results and Dr. Georgeann Oppenheim instructions to schedule follow up EGD in 2-3 months to remove gastric polyp. Also informed pt that a yeast infection was discovered during EGD procedure, Dr. Vicente Males has prescribed Diflucan to treat. Pt is aware of all information.

## 2017-12-09 NOTE — Telephone Encounter (Signed)
-----   Message from [name], MD sent at 12/07/2017  2:20 PM EDT ----- Sherald Hess inform   1/. Polyp benign on biopsies- since large in size - suggest EGD+excision in 2-3 months time-please schedule 2. Brushing of the esophagus shows an yest infection - suggest course of diflucan 200 mg q daily for 14 days

## 2017-12-10 ENCOUNTER — Encounter: Payer: Self-pay | Admitting: Gastroenterology

## 2017-12-30 ENCOUNTER — Ambulatory Visit (INDEPENDENT_AMBULATORY_CARE_PROVIDER_SITE_OTHER): Payer: Medicare Other | Admitting: Gastroenterology

## 2017-12-30 ENCOUNTER — Encounter: Payer: Self-pay | Admitting: Gastroenterology

## 2017-12-30 VITALS — BP 134/83 | HR 66 | Ht 69.0 in | Wt 214.0 lb

## 2017-12-30 DIAGNOSIS — D509 Iron deficiency anemia, unspecified: Secondary | ICD-10-CM

## 2017-12-30 DIAGNOSIS — K5900 Constipation, unspecified: Secondary | ICD-10-CM | POA: Diagnosis not present

## 2017-12-30 NOTE — Progress Notes (Signed)
[name] MD, MRCP(U.K) [address]  [place]  [place], [place] [address]  Main: [phone]  Fax: [phone]   Primary Care Physician: Letta Median, MD  Primary Gastroenterologist:  Dr. [name]   No chief complaint on file.   HPI: Brandon Benjamin is a 72 y.o. male  Summary of history :  He was initially seen  On 08/28/17 for microcytic anemia, melena for 1 month. No NSAID's. He has had a colonoscopy in [facility] 2 years back and was normal. Denies use of any blood thinners. He has been on iron for a month .  B12,folate  normal   09/09/17- Colonoscopy : 6 tiny polypsx 4 adenomas  were excised  EGD - 7 cm hiatal hernia- edematous appearing GE junction.   Interval history  10/02/2017-12/30/17    12/01/2017: EGD: Gastric polyp was seen.  About 10 mm in size.  Not resected as if it would have bled it would have affected the results of the capsule test.  Biopsies were taken.  Biopsies were done as of a large fundic gland polyp.  Candidiasis of the esophagus also noted.  12/08/2017: Small bowel capsule: Normal study of the small bowel.  No abnormalities noted.  Received IV iron taking PPI, doing well , feels more energetic, no new complaints Iron/TIBC/Ferritin/ %Sat  Says "stomach hurts ", began after his procedure says the capsule passed in his toilet . Points to his RLQ. Says he has been having some constipation . Having a bowel movement , last was this morning and was hard.    Take omeprazole 40 mg once day- no heartburn anymore .   Current Outpatient Medications  Medication Sig Dispense Refill  . ACCU-CHEK AVIVA PLUS test strip     . ACCU-CHEK SOFTCLIX LANCETS lancets     . acetaminophen (TYLENOL 8 HOUR ARTHRITIS PAIN) 650 MG CR tablet Take 650 mg by mouth every 8 (eight) hours as needed for pain.    Marland Kitchen amLODipine (NORVASC) 10 MG tablet Take 10 mg by mouth daily.    . cetirizine (ZYRTEC) 10 MG tablet Take 10 mg by mouth daily.    . citalopram  (CELEXA) 10 MG tablet Take 10 mg by mouth daily.    Marland Kitchen docusate sodium (COLACE) 100 MG capsule Take 100 mg by mouth 2 (two) times daily.    . ferrous sulfate 325 (65 FE) MG tablet Take 325 mg by mouth 3 (three) times daily with meals.    . fluticasone (FLONASE) 50 MCG/ACT nasal spray Place 2 sprays into both nostrils daily.     . hydrALAZINE (APRESOLINE) 25 MG tablet     . hydrochlorothiazide (HYDRODIURIL) 25 MG tablet Take 25 mg by mouth daily.     Marland Kitchen lisinopril (PRINIVIL,ZESTRIL) 40 MG tablet Take 40 mg by mouth daily.    Marland Kitchen lovastatin (MEVACOR) 20 MG tablet Take 20 mg by mouth at bedtime.    . metFORMIN (GLUCOPHAGE) 500 MG tablet Take 500 mg by mouth 2 (two) times daily.     . Meth-Hyo-M Bl-Na Phos-Ph Sal (URIBEL) 118 MG CAPS 1 tablet 4x daily 120 capsule 1  . metoprolol (LOPRESSOR) 100 MG tablet Take 100 mg by mouth 2 (two) times daily.     . Multiple Vitamin (MULTIVITAMIN WITH MINERALS) TABS tablet Take 1 tablet by mouth daily. Centrum Silver Multivitamin    . omeprazole (PRILOSEC) 40 MG capsule Take 1 capsule (40 mg total) by mouth daily. 90 capsule 3  . potassium chloride (K-DUR)  10 MEQ tablet Take 10 mEq by mouth daily.     . ranitidine (ZANTAC) 150 MG tablet Take 150 mg by mouth 2 (two) times daily.    . tamsulosin (FLOMAX) 0.4 MG CAPS capsule Take 1 capsule (0.4 mg total) by mouth daily after supper. 30 capsule 11   No current facility-administered medications for this visit.     Allergies as of 12/30/2017  . (No Known Allergies)    ROS:  General: Negative for anorexia, weight loss, fever, chills, fatigue, weakness. ENT: Negative for hoarseness, difficulty swallowing , nasal congestion. CV: Negative for chest pain, angina, palpitations, dyspnea on exertion, peripheral edema.  Respiratory: Negative for dyspnea at rest, dyspnea on exertion, cough, sputum, wheezing.  GI: See history of present illness. GU:  Negative for dysuria, hematuria, urinary incontinence, urinary frequency,  nocturnal urination.  Endo: Negative for unusual weight change.    Physical Examination:   There were no vitals taken for this visit.  General: Well-nourished, well-developed in no acute distress.  Eyes: No icterus. Conjunctivae pink. Mouth: Oropharyngeal mucosa moist and pink , no lesions erythema or exudate. Lungs: Clear to auscultation bilaterally. Non-labored. Heart: Regular rate and rhythm, no murmurs rubs or gallops.  Abdomen: Bowel sounds are normal, nontender, nondistended, no hepatosplenomegaly or masses, no abdominal bruits or hernia , no rebound or guarding.   Extremities: No lower extremity edema. No clubbing or deformities. Neuro: Alert and oriented x 3.  Grossly intact. Skin: Warm and dry, no jaundice.   Psych: Alert and cooperative, normal mood and affect.   Imaging Studies: No results found.  Assessment and Plan:   Brandon Benjamin is a 72 y.o. y/o male here to follow up for iron deficiency anemia. EGD showed a large hiatal hernia and edematous GE junction with bx showing no malignancy. Colonoscopy showed a few colon polyps that were excised.  Likely cause of iron deficiency anemia is the large hiatal hernia that was seen.  On his last endoscopy a 10 mm gastric polyp was seen which was biopsied and shows a fundic gland polyp.  Candidiasis also noted.  Doing well on IV iron   Plan  1. Continue iron 2.   EGD to resect the large fundic gland polyp seen in the stomach occasionally can bleed and be a cause of anemia. 3. Presently suffering from constipation and likely RLQ pain secondary to the same. Likely constipation from iron tabs which he takes TID. Trial of Linzess 145 mc a day - 2 Weeks samples a day  4. If no better at next visit despite improvement in constipation then will evaluate gall bladder.   I have discussed alternative options, risks & benefits,  which include, but are not limited to, bleeding, infection, perforation,respiratory complication & drug reaction.   The patient agrees with this plan & written consent will be obtained.     Dr [name]  MD,MRCP [facility]) Follow up in 2 weeks

## 2017-12-31 LAB — CBC WITH DIFFERENTIAL/PLATELET
Basophils Absolute: 0.1 10*3/uL (ref 0.0–0.2)
Basos: 1 %
EOS (ABSOLUTE): 0.2 10*3/uL (ref 0.0–0.4)
EOS: 2 %
HEMATOCRIT: 49.1 % (ref 37.5–51.0)
Hemoglobin: 17.1 g/dL (ref 13.0–17.7)
Immature Grans (Abs): 0 10*3/uL (ref 0.0–0.1)
Immature Granulocytes: 0 %
LYMPHS ABS: 1.7 10*3/uL (ref 0.7–3.1)
Lymphs: 28 %
MCH: 30.9 pg (ref 26.6–33.0)
MCHC: 34.8 g/dL (ref 31.5–35.7)
MCV: 89 fL (ref 79–97)
MONOS ABS: 0.5 10*3/uL (ref 0.1–0.9)
Monocytes: 8 %
Neutrophils Absolute: 3.7 10*3/uL (ref 1.4–7.0)
Neutrophils: 61 %
Platelets: 212 10*3/uL (ref 150–450)
RBC: 5.53 x10E6/uL (ref 4.14–5.80)
RDW: 14.1 % (ref 12.3–15.4)
WBC: 6.2 10*3/uL (ref 3.4–10.8)

## 2018-01-02 ENCOUNTER — Telehealth: Payer: Self-pay | Admitting: Gastroenterology

## 2018-01-02 NOTE — Telephone Encounter (Signed)
Pt wife is calling to let Dr. Vicente Males  Know how he has been doing on Rx he is doing good and has bowel movements but still having pain, she states it feels like there is something in there.

## 2018-01-07 ENCOUNTER — Ambulatory Visit: Payer: Medicare Other | Admitting: Gastroenterology

## 2018-01-08 NOTE — Telephone Encounter (Signed)
Spoke with pt wife Pamala Hurry regarding her concern for pt condition. Mrs. Pamala Hurry states pt is no longer experiencing the abdominal pain the 145 mcg Linzess has helped significantly.

## 2018-01-12 ENCOUNTER — Inpatient Hospital Stay: Payer: Medicare Other | Attending: Oncology

## 2018-01-12 DIAGNOSIS — N400 Enlarged prostate without lower urinary tract symptoms: Secondary | ICD-10-CM | POA: Diagnosis not present

## 2018-01-12 DIAGNOSIS — K219 Gastro-esophageal reflux disease without esophagitis: Secondary | ICD-10-CM | POA: Diagnosis not present

## 2018-01-12 DIAGNOSIS — D500 Iron deficiency anemia secondary to blood loss (chronic): Secondary | ICD-10-CM | POA: Insufficient documentation

## 2018-01-12 DIAGNOSIS — K317 Polyp of stomach and duodenum: Secondary | ICD-10-CM | POA: Insufficient documentation

## 2018-01-12 DIAGNOSIS — K2210 Ulcer of esophagus without bleeding: Secondary | ICD-10-CM | POA: Diagnosis not present

## 2018-01-12 DIAGNOSIS — I10 Essential (primary) hypertension: Secondary | ICD-10-CM | POA: Insufficient documentation

## 2018-01-12 DIAGNOSIS — Z87891 Personal history of nicotine dependence: Secondary | ICD-10-CM | POA: Diagnosis not present

## 2018-01-12 DIAGNOSIS — E785 Hyperlipidemia, unspecified: Secondary | ICD-10-CM | POA: Insufficient documentation

## 2018-01-12 DIAGNOSIS — K449 Diaphragmatic hernia without obstruction or gangrene: Secondary | ICD-10-CM | POA: Insufficient documentation

## 2018-01-12 DIAGNOSIS — Z79899 Other long term (current) drug therapy: Secondary | ICD-10-CM | POA: Insufficient documentation

## 2018-01-12 DIAGNOSIS — E119 Type 2 diabetes mellitus without complications: Secondary | ICD-10-CM | POA: Insufficient documentation

## 2018-01-12 DIAGNOSIS — Z7984 Long term (current) use of oral hypoglycemic drugs: Secondary | ICD-10-CM | POA: Diagnosis not present

## 2018-01-12 DIAGNOSIS — R1011 Right upper quadrant pain: Secondary | ICD-10-CM | POA: Diagnosis not present

## 2018-01-12 DIAGNOSIS — R5383 Other fatigue: Secondary | ICD-10-CM | POA: Diagnosis not present

## 2018-01-12 LAB — CBC WITH DIFFERENTIAL/PLATELET
ABS IMMATURE GRANULOCYTES: 0.03 10*3/uL (ref 0.00–0.07)
Basophils Absolute: 0.1 10*3/uL (ref 0.0–0.1)
Basophils Relative: 1 %
Eosinophils Absolute: 0.2 10*3/uL (ref 0.0–0.5)
Eosinophils Relative: 3 %
HEMATOCRIT: 46.3 % (ref 39.0–52.0)
HEMOGLOBIN: 16.1 g/dL (ref 13.0–17.0)
IMMATURE GRANULOCYTES: 1 %
LYMPHS ABS: 2 10*3/uL (ref 0.7–4.0)
LYMPHS PCT: 30 %
MCH: 30.3 pg (ref 26.0–34.0)
MCHC: 34.8 g/dL (ref 30.0–36.0)
MCV: 87 fL (ref 80.0–100.0)
MONO ABS: 0.6 10*3/uL (ref 0.1–1.0)
MONOS PCT: 9 %
NEUTROS ABS: 3.7 10*3/uL (ref 1.7–7.7)
NEUTROS PCT: 56 %
Platelets: 179 10*3/uL (ref 150–400)
RBC: 5.32 MIL/uL (ref 4.22–5.81)
RDW: 14 % (ref 11.5–15.5)
WBC: 6.5 10*3/uL (ref 4.0–10.5)
nRBC: 0 % (ref 0.0–0.2)

## 2018-01-12 LAB — IRON AND TIBC
IRON: 161 ug/dL (ref 45–182)
Saturation Ratios: 43 % — ABNORMAL HIGH (ref 17.9–39.5)
TIBC: 376 ug/dL (ref 250–450)
UIBC: 215 ug/dL

## 2018-01-12 LAB — FERRITIN: Ferritin: 66 ng/mL (ref 24–336)

## 2018-01-13 ENCOUNTER — Encounter: Payer: Self-pay | Admitting: Gastroenterology

## 2018-01-13 ENCOUNTER — Ambulatory Visit (INDEPENDENT_AMBULATORY_CARE_PROVIDER_SITE_OTHER): Payer: Medicare Other | Admitting: Gastroenterology

## 2018-01-13 VITALS — BP 152/90 | HR 63 | Ht 69.0 in | Wt 214.0 lb

## 2018-01-13 DIAGNOSIS — R1011 Right upper quadrant pain: Secondary | ICD-10-CM | POA: Diagnosis not present

## 2018-01-13 NOTE — Progress Notes (Signed)
[name] MD, MRCP(U.K) [address]  [place]  [place], [place] [address]  Main: [phone]  Fax: [phone]   Primary Care Physician: Letta Median, MD  Primary Gastroenterologist:  Dr. [name]   Chief Complaint  Patient presents with  . Follow-up    constipation, iron deficiency anemia    HPI: Brandon Benjamin is a 72 y.o. male   Summary of history :  He was initially seen On 08/28/17 for microcytic anemia, melena for 1 month. No NSAID's. He has had a colonoscopy in [facility] 2 years back and was normal. Denies use of any blood thinners. He has been on iron for a month .  B12,folate normal   09/09/17- Colonoscopy : 6 tiny polypsx 4 adenomas were excised  EGD - 7 cm hiatal hernia- edematous appearing GE junction. 12/01/2017: EGD: Gastric polyp was seen.  About 10 mm in size.  Not resected as if it would have bled it would have affected the results of the capsule test.  Biopsies were taken.  Biopsies were done as of a large fundic gland polyp.  Candidiasis of the esophagus also noted. 12/08/2017: Small bowel capsule: Normal study of the small bowel.  No abnormalities noted.   Interval history10/8/19 -01/13/18   Taking linzess daily- says worked to an extent he states, thinks a higher dose would help, he states he has had on and off RUQ pain, s/p cholecystectomy. Worse after he eats but huts even when he doesn't eat   Received IV iron taking PPI, doing well , feels more energetic, no new complaints Iron/TIBC/Ferritin/ %Sat  Says "stomach hurts ", began after his procedure says the capsule passed in his toilet . Points to his RLQ. Says he has been having some constipation . Having a bowel movement , last was this morning and was hard.    Take omeprazole 40 mg once day- no heartburn anymore  Iron studies have normalized from 01/12/18- taking prilosec.   Current Outpatient Medications  Medication Sig Dispense Refill  . ACCU-CHEK AVIVA  PLUS test strip     . ACCU-CHEK SOFTCLIX LANCETS lancets     . acetaminophen (TYLENOL 8 HOUR ARTHRITIS PAIN) 650 MG CR tablet Take 650 mg by mouth every 8 (eight) hours as needed for pain.    Marland Kitchen amLODipine (NORVASC) 10 MG tablet Take 10 mg by mouth daily.    . cetirizine (ZYRTEC) 10 MG tablet Take 10 mg by mouth daily.    . citalopram (CELEXA) 10 MG tablet Take 10 mg by mouth daily.    Marland Kitchen docusate sodium (COLACE) 100 MG capsule Take 100 mg by mouth 2 (two) times daily.    . fluticasone (FLONASE) 50 MCG/ACT nasal spray Place 2 sprays into both nostrils daily.     . hydrALAZINE (APRESOLINE) 25 MG tablet     . lisinopril (PRINIVIL,ZESTRIL) 40 MG tablet Take 40 mg by mouth daily.    Marland Kitchen lovastatin (MEVACOR) 20 MG tablet Take 20 mg by mouth at bedtime.    . metFORMIN (GLUCOPHAGE) 500 MG tablet Take 500 mg by mouth 2 (two) times daily.     . Meth-Hyo-M Bl-Na Phos-Ph Sal (URIBEL) 118 MG CAPS 1 tablet 4x daily 120 capsule 1  . metoprolol (LOPRESSOR) 100 MG tablet Take 100 mg by mouth 2 (two) times daily.     . Multiple Vitamin (MULTIVITAMIN WITH MINERALS) TABS tablet Take 1 tablet by mouth daily. Centrum Silver Multivitamin    . omeprazole (PRILOSEC) 40 MG capsule  Take 1 capsule (40 mg total) by mouth daily. 90 capsule 3  . potassium chloride (K-DUR) 10 MEQ tablet Take 10 mEq by mouth daily.     . ranitidine (ZANTAC) 150 MG tablet Take 150 mg by mouth 2 (two) times daily.    . tamsulosin (FLOMAX) 0.4 MG CAPS capsule Take 1 capsule (0.4 mg total) by mouth daily after supper. 30 capsule 11  . ferrous sulfate 325 (65 FE) MG tablet Take 325 mg by mouth 3 (three) times daily with meals.    . hydrochlorothiazide (HYDRODIURIL) 25 MG tablet Take 25 mg by mouth daily.      No current facility-administered medications for this visit.     Allergies as of 01/13/2018  . (No Known Allergies)    ROS:  General: Negative for anorexia, weight loss, fever, chills, fatigue, weakness. ENT: Negative for hoarseness,  difficulty swallowing , nasal congestion. CV: Negative for chest pain, angina, palpitations, dyspnea on exertion, peripheral edema.  Respiratory: Negative for dyspnea at rest, dyspnea on exertion, cough, sputum, wheezing.  GI: See history of present illness. GU:  Negative for dysuria, hematuria, urinary incontinence, urinary frequency, nocturnal urination.  Endo: Negative for unusual weight change.    Physical Examination:   BP (!) 152/90   Pulse 63   Ht 5\' 9"  (1.753 m)   Wt 214 lb (97.1 kg)   BMI 31.60 kg/m   General: Well-nourished, well-developed in no acute distress.  Eyes: No icterus. Conjunctivae pink. Mouth: Oropharyngeal mucosa moist and pink , no lesions erythema or exudate. Lungs: Clear to auscultation bilaterally. Non-labored. Heart: Regular rate and rhythm, no murmurs rubs or gallops.  Abdomen: Bowel sounds are normal, nontender, nondistended, no hepatosplenomegaly or masses, no abdominal bruits or hernia , no rebound or guarding.   Extremities: No lower extremity edema. No clubbing or deformities. Neuro: Alert and oriented x 3.  Grossly intact. Skin: Warm and dry, no jaundice.   Psych: Alert and cooperative, normal mood and affect.   Imaging Studies: No results found.  Assessment and Plan:   Brandon Benjamin is a 72 y.o. y/o male here to follow up for iron deficiency anemia. EGD showed a large hiatal hernia and edematous GE junction with bx showing no malignancy. Colonoscopy showed a few colon polyps that were excised.  Likely cause of iron deficiency anemia is the large hiatal hernia that was seen.  On his last endoscopy a 10 mm gastric polyp was seen which was biopsied and shows a fundic gland polyp.  Candidiasis also noted.  Doing well on IV iron. RUQ pain - unclear if related to IBS vs stones in CBD or musculoskeletal   Plan  1.   EGD to resect the large fundic gland polyp seen in the stomach occasionally can bleed and be a cause of anemia.already scheduled  2.  RUQ USG, CMP. If negative and still has pain despite increase in linzess to 290 mcg then will consider CT abdomen     Dr [name]  MD,MRCP [facility]) Follow up in 2-3 weeks

## 2018-01-14 ENCOUNTER — Inpatient Hospital Stay: Payer: Medicare Other

## 2018-01-14 ENCOUNTER — Encounter: Payer: Self-pay | Admitting: Oncology

## 2018-01-14 ENCOUNTER — Other Ambulatory Visit: Payer: Self-pay

## 2018-01-14 ENCOUNTER — Inpatient Hospital Stay (HOSPITAL_BASED_OUTPATIENT_CLINIC_OR_DEPARTMENT_OTHER): Payer: Medicare Other | Admitting: Oncology

## 2018-01-14 ENCOUNTER — Telehealth: Payer: Self-pay

## 2018-01-14 VITALS — BP 141/91 | HR 53 | Temp 97.5°F | Resp 18 | Wt 213.3 lb

## 2018-01-14 DIAGNOSIS — E785 Hyperlipidemia, unspecified: Secondary | ICD-10-CM

## 2018-01-14 DIAGNOSIS — R5383 Other fatigue: Secondary | ICD-10-CM

## 2018-01-14 DIAGNOSIS — K317 Polyp of stomach and duodenum: Secondary | ICD-10-CM

## 2018-01-14 DIAGNOSIS — N400 Enlarged prostate without lower urinary tract symptoms: Secondary | ICD-10-CM

## 2018-01-14 DIAGNOSIS — Z79899 Other long term (current) drug therapy: Secondary | ICD-10-CM

## 2018-01-14 DIAGNOSIS — Z87891 Personal history of nicotine dependence: Secondary | ICD-10-CM

## 2018-01-14 DIAGNOSIS — R1011 Right upper quadrant pain: Secondary | ICD-10-CM | POA: Diagnosis not present

## 2018-01-14 DIAGNOSIS — D500 Iron deficiency anemia secondary to blood loss (chronic): Secondary | ICD-10-CM | POA: Diagnosis not present

## 2018-01-14 DIAGNOSIS — E119 Type 2 diabetes mellitus without complications: Secondary | ICD-10-CM

## 2018-01-14 DIAGNOSIS — Z7984 Long term (current) use of oral hypoglycemic drugs: Secondary | ICD-10-CM

## 2018-01-14 DIAGNOSIS — I10 Essential (primary) hypertension: Secondary | ICD-10-CM

## 2018-01-14 DIAGNOSIS — K2210 Ulcer of esophagus without bleeding: Secondary | ICD-10-CM

## 2018-01-14 DIAGNOSIS — K449 Diaphragmatic hernia without obstruction or gangrene: Secondary | ICD-10-CM

## 2018-01-14 DIAGNOSIS — K219 Gastro-esophageal reflux disease without esophagitis: Secondary | ICD-10-CM

## 2018-01-14 LAB — URINALYSIS, COMPLETE (UACMP) WITH MICROSCOPIC
BILIRUBIN URINE: NEGATIVE
Bacteria, UA: NONE SEEN
Glucose, UA: NEGATIVE mg/dL
HGB URINE DIPSTICK: NEGATIVE
KETONES UR: NEGATIVE mg/dL
Leukocytes, UA: NEGATIVE
Nitrite: NEGATIVE
PH: 6 (ref 5.0–8.0)
Protein, ur: NEGATIVE mg/dL
Specific Gravity, Urine: 1.019 (ref 1.005–1.030)

## 2018-01-14 LAB — COMPREHENSIVE METABOLIC PANEL
ALBUMIN: 4.4 g/dL (ref 3.5–4.8)
ALT: 23 IU/L (ref 0–44)
AST: 17 IU/L (ref 0–40)
Albumin/Globulin Ratio: 2 (ref 1.2–2.2)
Alkaline Phosphatase: 68 IU/L (ref 39–117)
BUN / CREAT RATIO: 12 (ref 10–24)
BUN: 15 mg/dL (ref 8–27)
Bilirubin Total: 0.4 mg/dL (ref 0.0–1.2)
CALCIUM: 10.6 mg/dL — AB (ref 8.6–10.2)
CO2: 27 mmol/L (ref 20–29)
CREATININE: 1.3 mg/dL — AB (ref 0.76–1.27)
Chloride: 104 mmol/L (ref 96–106)
GFR calc non Af Amer: 55 mL/min/{1.73_m2} — ABNORMAL LOW (ref >59)
GFR, EST AFRICAN AMERICAN: 64 mL/min/{1.73_m2} (ref >59)
GLUCOSE: 99 mg/dL (ref 65–99)
Globulin, Total: 2.2 g/dL (ref 1.5–4.5)
Potassium: 3.9 mmol/L (ref 3.5–5.2)
Sodium: 145 mmol/L — ABNORMAL HIGH (ref 134–144)
TOTAL PROTEIN: 6.6 g/dL (ref 6.0–8.5)

## 2018-01-14 NOTE — Telephone Encounter (Signed)
Spoke with pt and informed him of U/S appointment date, time, location, and prior prep instructions.

## 2018-01-14 NOTE — Progress Notes (Signed)
Hematology/Oncology follow up  [facility] [phone] [phone]   Patient Care Team: Letta Median, MD as PCP - General Rebeca Alert, Durene Cal, MD as Referring Physician (Family Medicine) Christene Lye, MD (General Surgery)  REFERRING PROVIDER: Dr.Anna REASON FOR VISIT Follow up for treatment of  Iron deficiency anemia  HISTORY OF PRESENTING ILLNESS:  Brandon Benjamin is a  72 y.o.  male with PMH listed below who was referred to me for evaluation of microcytic anemia.  Recent labs suggest microcytic anemia with hemoglobin 8.1 and MCV of 68.  Reviewed patient's previous Lab work in the past reviewed showed a chronic history of anemia with baseline hemoglobin around 10. Patient reports having black stools, intermittently, duration for about past month.  Not associated with abdominal pain, diarrhea, constipation, change of bowel habits.  Patient does not use any NSAIDs, denies using any anticoagulation..  Last colonoscopy was about 2 years ago at [facility] and per patient was normal. She started back on oral iron supplementation for about a month.  Repeat CBC recently showed corrected hemoglobin of 13.  Denies any history of PRBC transfusion during the interval.    # 09/09/17- Colonoscopy : 6 tiny polypsx 4 adenomas  were excised  EGD - 2 esophageal ulcers with no bleedings. 7 cm hiatal hernia- edematous appearing GE junction. INTERVAL HISTORY Brandon Benjamin is a 72 y.o. male who has above history reviewed by me today presents for follow up visit for management of iron deficiency anemia.  He is accompanied by his wife. Reports recently has EGD done on 12/01/2017.  He has gastric polyps, biopsy was taken. Pathology showed fundic gland polyp. Dr.Anna recommend EGD resection in 2-3 months.  Candidiasis of the esophagus was also noted.  12/08/2017 capsule study showed no abnormality.  Patient was suggested to take  Diflucan 200mg  daily x  14 daily.   He continues to have intermittent RUQ pain, worsen after he eats. Dr.Anna ordered US RUQ.   Fatigue is better after IV iron infusion.    by me today presents for follow up visit for management of iron deficiency anemia.  During the interval, patient has received IV venofer weekly x 4.  He subjectively feels fatigue is much better.  No new compliant.  During the interval, he had endoscopy done.      Review of Systems  Constitutional: Negative for chills, fever, malaise/fatigue and weight loss.  HENT: Negative for congestion, ear discharge, ear pain, nosebleeds, sinus pain and sore throat.   Eyes: Negative for double vision, photophobia and pain.  Respiratory: Negative for cough, hemoptysis and shortness of breath.   Cardiovascular: Negative for chest pain, palpitations, orthopnea and leg swelling.  Gastrointestinal: Positive for abdominal pain. Negative for blood in stool, constipation, diarrhea, heartburn, melena, nausea and vomiting.  Genitourinary: Negative for dysuria, flank pain, frequency and hematuria.  Musculoskeletal: Negative for back pain, myalgias and neck pain.  Skin: Negative for itching and rash.  Neurological: Negative for dizziness, tingling, tremors, focal weakness, weakness and headaches.  Endo/Heme/Allergies: Negative for environmental allergies. Does not bruise/bleed easily.  Psychiatric/Behavioral: Negative for depression and hallucinations. The patient is not nervous/anxious.     MEDICAL HISTORY:  Past Medical History:  Diagnosis Date  . Anemia   . Anxiety   . Balanitis   . Balanitis   . BPH (benign prostatic hyperplasia)   . Diabetes ([place])   . Erectile dysfunction   . GERD (gastroesophageal reflux disease)   . History  of kidney stones   . HLD (hyperlipidemia)   . HTN (hypertension)   . Iron deficiency anemia due to chronic blood loss 09/04/2017  . Over weight   . Priapism   . Scrotal cyst   . Urinary urgency     SURGICAL  HISTORY: Past Surgical History:  Procedure Laterality Date  . CHOLECYSTECTOMY N/A 12/25/2016   Procedure: LAPAROSCOPIC CHOLECYSTECTOMY WITH INTRAOPERATIVE CHOLANGIOGRAM;  Surgeon: Jules Husbands, MD;  Location: [facility] ORS;  Service: General;  Laterality: N/A;  . COLONOSCOPY WITH PROPOFOL N/A 09/09/2017   Procedure: COLONOSCOPY WITH PROPOFOL;  Surgeon: [name], MD;  Location: [facility] ENDOSCOPY;  Service: Gastroenterology;  Laterality: N/A;  . CYSTOSCOPY WITH LITHOLAPAXY N/A 07/16/2017   Procedure: CYSTOSCOPY WITH LITHOLAPAXY;  Surgeon: Hollice Espy, MD;  Location: [facility] ORS;  Service: Urology;  Laterality: N/A;  . ERCP N/A 11/28/2016   Procedure: ENDOSCOPIC RETROGRADE CHOLANGIOPANCREATOGRAPHY (ERCP);  Surgeon: [name], MD;  Location: [facility] ENDOSCOPY;  Service: Endoscopy;  Laterality: N/A;  . ESOPHAGOGASTRODUODENOSCOPY (EGD) WITH PROPOFOL N/A 09/09/2017   Procedure: ESOPHAGOGASTRODUODENOSCOPY (EGD) WITH PROPOFOL;  Surgeon: [name], MD;  Location: [facility] ENDOSCOPY;  Service: Gastroenterology;  Laterality: N/A;  . ESOPHAGOGASTRODUODENOSCOPY (EGD) WITH PROPOFOL N/A 12/01/2017   Procedure: ESOPHAGOGASTRODUODENOSCOPY (EGD) WITH PROPOFOL;  Surgeon: [name], MD;  Location: [facility] ENDOSCOPY;  Service: Gastroenterology;  Laterality: N/A;  . GIVENS CAPSULE STUDY N/A 12/01/2017   Procedure: GIVENS CAPSULE STUDY;  Surgeon: [name], MD;  Location: [facility] ENDOSCOPY;  Service: Gastroenterology;  Laterality: N/A;  . HOLEP-LASER ENUCLEATION OF THE PROSTATE WITH MORCELLATION N/A 07/11/2015   Procedure: HOLEP-LASER ENUCLEATION OF THE PROSTATE WITH MORCELLATION;  Surgeon: Hollice Espy, MD;  Location: [facility] ORS;  Service: Urology;  Laterality: N/A;  . TRANSURETHRAL RESECTION OF PROSTATE N/A 07/16/2017   Procedure: TRANSURETHRAL RESECTION OF THE PROSTATE (TURP);  Surgeon: Hollice Espy, MD;  Location: [facility] ORS;  Service: Urology;  Laterality: N/A;  . UMBILICAL HERNIA REPAIR  12/25/2016   Procedure: HERNIA REPAIR  UMBILICAL ADULT;  Surgeon: Jules Husbands, MD;  Location: [facility] ORS;  Service: General;;    SOCIAL HISTORY: Social History   Socioeconomic History  . Marital status: Married    Spouse name: Not on file  . Number of children: Not on file  . Years of education: Not on file  . Highest education level: Not on file  Occupational History  . Not on file  Social Needs  . Financial resource strain: Not on file  . Food insecurity:    Worry: Not on file    Inability: Not on file  . Transportation needs:    Medical: Not on file    Non-medical: Not on file  Tobacco Use  . Smoking status: Former Smoker    Types: Cigarettes    Last attempt to quit: 03/26/1995    Years since quitting: 22.8  . Smokeless tobacco: Never Used  Substance and Sexual Activity  . Alcohol use: No  . Drug use: No  . Sexual activity: Not on file  Lifestyle  . Physical activity:    Days per week: Not on file    Minutes per session: Not on file  . Stress: Not on file  Relationships  . Social connections:    Talks on phone: Not on file    Gets together: Not on file    Attends religious service: Not on file    Active member of club or organization: Not on file    Attends meetings of clubs or organizations: Not on file  Relationship status: Not on file  . Intimate partner violence:    Fear of current or ex partner: Not on file    Emotionally abused: Not on file    Physically abused: Not on file    Forced sexual activity: Not on file  Other Topics Concern  . Not on file  Social History Narrative  . Not on file    FAMILY HISTORY: Family History  Problem Relation Age of Onset  . Cancer Mother        blood stream  . High blood pressure Mother   . Hypertension Sister   . Anemia Sister   . Prostate cancer Neg Hx   . Bladder Cancer Neg Hx   . Kidney disease Neg Hx     ALLERGIES:  has No Known Allergies.  MEDICATIONS:  Current Outpatient Medications  Medication Sig Dispense Refill  . ACCU-CHEK AVIVA  PLUS test strip     . ACCU-CHEK SOFTCLIX LANCETS lancets     . acetaminophen (TYLENOL 8 HOUR ARTHRITIS PAIN) 650 MG CR tablet Take 650 mg by mouth every 8 (eight) hours as needed for pain.    Marland Kitchen amLODipine (NORVASC) 10 MG tablet Take 10 mg by mouth daily.    . cetirizine (ZYRTEC) 10 MG tablet Take 10 mg by mouth daily.    . citalopram (CELEXA) 10 MG tablet Take 10 mg by mouth daily.    Marland Kitchen docusate sodium (COLACE) 100 MG capsule Take 100 mg by mouth 2 (two) times daily.    . fluticasone (FLONASE) 50 MCG/ACT nasal spray Place 2 sprays into both nostrils daily.     . hydrALAZINE (APRESOLINE) 25 MG tablet     . hydrochlorothiazide (HYDRODIURIL) 25 MG tablet Take 25 mg by mouth daily.     Marland Kitchen linaclotide (LINZESS) 290 MCG CAPS capsule Take 290 mcg by mouth daily before breakfast.    . lisinopril (PRINIVIL,ZESTRIL) 40 MG tablet Take 40 mg by mouth daily.    Marland Kitchen lovastatin (MEVACOR) 20 MG tablet Take 20 mg by mouth at bedtime.    . metFORMIN (GLUCOPHAGE) 500 MG tablet Take 500 mg by mouth 2 (two) times daily.     . metoprolol (LOPRESSOR) 100 MG tablet Take 100 mg by mouth 2 (two) times daily.     . Multiple Vitamin (MULTIVITAMIN WITH MINERALS) TABS tablet Take 1 tablet by mouth daily. Centrum Silver Multivitamin    . omeprazole (PRILOSEC) 40 MG capsule Take 1 capsule (40 mg total) by mouth daily. 90 capsule 3  . potassium chloride (K-DUR) 10 MEQ tablet Take 10 mEq by mouth daily.     . ranitidine (ZANTAC) 150 MG tablet Take 150 mg by mouth 2 (two) times daily.    . tamsulosin (FLOMAX) 0.4 MG CAPS capsule Take 1 capsule (0.4 mg total) by mouth daily after supper. 30 capsule 11  . ferrous sulfate 325 (65 FE) MG tablet Take 325 mg by mouth 3 (three) times daily with meals.    . Meth-Hyo-M Bl-Na Phos-Ph Sal (URIBEL) 118 MG CAPS 1 tablet 4x daily (Patient not taking: Reported on 01/14/2018) 120 capsule 1   No current facility-administered medications for this visit.      PHYSICAL EXAMINATION: ECOG  PERFORMANCE STATUS: 1 - Symptomatic but completely ambulatory Vitals:   01/14/18 1322  BP: (!) 141/91  Pulse: (!) 53  Resp: 18  Temp: (!) 97.5 F (36.4 C)   Filed Weights   01/14/18 1322  Weight: 213 lb 4.8 oz (96.8 kg)  Physical Exam  Constitutional: He is oriented to person, place, and time. No distress.  obese  HENT:  Head: Normocephalic and atraumatic.  Mouth/Throat: Oropharynx is clear and moist.  Eyes: Pupils are equal, round, and reactive to light. Conjunctivae and EOM are normal. No scleral icterus.  Neck: Normal range of motion. Neck supple.  Cardiovascular: Normal rate, regular rhythm and normal heart sounds.  Pulmonary/Chest: Effort normal and breath sounds normal. No respiratory distress. He has no wheezes. He has no rales. He exhibits no tenderness.  Abdominal: Soft. Bowel sounds are normal. He exhibits no distension. There is no tenderness.  Musculoskeletal: Normal range of motion. He exhibits no edema or deformity.  Lymphadenopathy:    He has no cervical adenopathy.  Neurological: He is alert and oriented to person, place, and time. No cranial nerve deficit. Coordination normal.  Skin: Skin is warm and dry. No rash noted.  Psychiatric: He has a normal mood and affect. His behavior is normal. Thought content normal.     LABORATORY DATA:  I have reviewed the data as listed Lab Results  Component Value Date   WBC 6.5 01/12/2018   HGB 16.1 01/12/2018   HCT 46.3 01/12/2018   MCV 87.0 01/12/2018   PLT 179 01/12/2018   Recent Labs    07/03/17 1524 07/16/17 1204 07/17/17 0455 01/13/18 1401  NA 139 139 137 145*  K 3.1* 3.5 3.5 3.9  CL 105  --  104 104  CO2 29  --  27 27  GLUCOSE 83 115* 137* 99  BUN 23*  --  17 15  CREATININE 1.12  --  1.01 1.30*  CALCIUM 9.9  --  9.0 10.6*  GFRNONAA >60  --  >60 55*  GFRAA >60  --  >60 64  PROT  --   --   --  6.6  ALBUMIN  --   --   --  4.4  AST  --   --   --  17  ALT  --   --   --  23  ALKPHOS  --   --   --   68  BILITOT  --   --   --  0.4   Iron/TIBC/Ferritin/ %Sat    Component Value Date/Time   IRON 161 01/12/2018 1340   IRON 172 (H) 08/28/2017 1428   TIBC 376 01/12/2018 1340   TIBC 416 08/28/2017 1428   FERRITIN 66 01/12/2018 1340   FERRITIN 20 (L) 08/28/2017 1428   IRONPCTSAT 43 (H) 01/12/2018 1340   IRONPCTSAT 41 08/28/2017 1428        ASSESSMENT & PLAN:  1. Iron deficiency anemia due to chronic blood loss   2. RUQ abdominal pain    S/p Venofer 200mg  weekly x 4. Labs reviewed. Hemoglobin and iron store stable.  No need for additional IV iron.  Follow up with GI for EGD resection of gastric polyp.  Check UA RUQ pain, unknown etiology.  Agree with proceeding [place] RUQ, if no finding, Dr.Anna plan to do CT abdomen.   Orders Placed This Encounter  Procedures  . CBC with Differential/Platelet    Standing Status:   Future    Standing Expiration Date:   01/15/2019  . Iron and TIBC    Standing Status:   Future    Standing Expiration Date:   01/15/2019  . Ferritin    Standing Status:   Future    Standing Expiration Date:   01/15/2019  . Urinalysis, Complete w Microscopic  All questions were answered. The patient knows to call the clinic with any problems questions or concerns.  Return of visit:  4 months.      Earlie Server, MD, PhD Hematology Oncology [facility] Pager- [phone] 01/14/2018

## 2018-01-14 NOTE — Progress Notes (Signed)
Patient here for follow up. No concerns voiced.  °

## 2018-01-16 ENCOUNTER — Ambulatory Visit
Admission: RE | Admit: 2018-01-16 | Discharge: 2018-01-16 | Disposition: A | Discharge status: Home / Self Care | Payer: Medicare Other | Source: Ambulatory Visit | Attending: Gastroenterology | Admitting: Gastroenterology

## 2018-01-16 DIAGNOSIS — Z9049 Acquired absence of other specified parts of digestive tract: Secondary | ICD-10-CM | POA: Insufficient documentation

## 2018-01-16 DIAGNOSIS — K7689 Other specified diseases of liver: Secondary | ICD-10-CM | POA: Diagnosis not present

## 2018-01-16 DIAGNOSIS — R1011 Right upper quadrant pain: Secondary | ICD-10-CM | POA: Diagnosis present

## 2018-01-25 ENCOUNTER — Encounter: Payer: Self-pay | Admitting: Gastroenterology

## 2018-01-28 ENCOUNTER — Ambulatory Visit (INDEPENDENT_AMBULATORY_CARE_PROVIDER_SITE_OTHER): Payer: Medicare Other | Admitting: Gastroenterology

## 2018-01-28 ENCOUNTER — Encounter: Payer: Self-pay | Admitting: Gastroenterology

## 2018-01-28 VITALS — BP 148/86 | HR 68 | Ht 69.0 in | Wt 217.6 lb

## 2018-01-28 DIAGNOSIS — R1031 Right lower quadrant pain: Secondary | ICD-10-CM

## 2018-01-28 DIAGNOSIS — K5900 Constipation, unspecified: Secondary | ICD-10-CM | POA: Diagnosis not present

## 2018-01-28 DIAGNOSIS — G8929 Other chronic pain: Secondary | ICD-10-CM

## 2018-01-28 MED ORDER — DICYCLOMINE HCL 10 MG PO CAPS: 10.0000 mg | ORAL_CAPSULE | Freq: Three times a day (TID) | ORAL | 0 refills | Status: DC

## 2018-01-28 MED ORDER — LINACLOTIDE 290 MCG PO CAPS: 290.0000 ug | ORAL_CAPSULE | Freq: Every day | ORAL | 1 refills | Status: DC

## 2018-01-28 NOTE — Progress Notes (Signed)
[name] MD, MRCP(U.K) [address]  [place]  [place], [place] [address]  Main: [phone]  Fax: [phone]   Primary Care Physician: Letta Median, MD  Primary Gastroenterologist:  Dr. [name]   Chief Complaint  Patient presents with  . Follow-up    Abdominal pain, RUQ    HPI: Brandon Benjamin is a 72 y.o. male   Summary of history :  He was initially seen On 08/28/17 for microcytic anemia, melena for 1 month. No NSAID's. He has had a colonoscopy in [facility] 2 years back and was normal. Denies use of any blood thinners. He has been on iron for a month .  B12,folate normal   09/09/17- Colonoscopy : 6 tiny polypsx 4 adenomas were excised  EGD - 7 cm hiatal hernia- edematous appearing GE junction. 12/01/2017: EGD: Gastric polyp was seen. About 10 mm in size. Not resected as if it would have bled it would have affected the results of the capsule test. Biopsies were taken. Biopsies were done as of a large fundic gland polyp. Candidiasis of the esophagus also noted. 12/08/2017: Small bowel capsule: Normal study of the small bowel. No abnormalities noted. Iron studies have normalized from 01/12/18  Interval history10/8/19-10/22/19   RUQ USG 01/16/18 : simple hepatic cysts of the right hepatic lobe  01/13/18 : CMP,CBC-Normal    Constipation is doing well. Still has pain in the RLQ- usually when he eats , pain eases during the day . Not severe. On PPI , running out of Linzess.     Current Outpatient Medications  Medication Sig Dispense Refill  . ACCU-CHEK AVIVA PLUS test strip     . ACCU-CHEK SOFTCLIX LANCETS lancets     . acetaminophen (TYLENOL 8 HOUR ARTHRITIS PAIN) 650 MG CR tablet Take 650 mg by mouth every 8 (eight) hours as needed for pain.    Marland Kitchen amLODipine (NORVASC) 10 MG tablet Take 10 mg by mouth daily.    . cetirizine (ZYRTEC) 10 MG tablet Take 10 mg by mouth daily.    . citalopram (CELEXA) 10 MG tablet Take 10 mg by mouth  daily.    Marland Kitchen docusate sodium (COLACE) 100 MG capsule Take 100 mg by mouth 2 (two) times daily.    . ferrous sulfate 325 (65 FE) MG tablet Take 325 mg by mouth 3 (three) times daily with meals.    . fluticasone (FLONASE) 50 MCG/ACT nasal spray Place 2 sprays into both nostrils daily.     . hydrALAZINE (APRESOLINE) 25 MG tablet     . linaclotide (LINZESS) 290 MCG CAPS capsule Take 290 mcg by mouth daily before breakfast.    . lisinopril (PRINIVIL,ZESTRIL) 40 MG tablet Take 40 mg by mouth daily.    Marland Kitchen lovastatin (MEVACOR) 20 MG tablet Take 20 mg by mouth at bedtime.    . metFORMIN (GLUCOPHAGE) 500 MG tablet Take 500 mg by mouth 2 (two) times daily.     . metoprolol (LOPRESSOR) 100 MG tablet Take 100 mg by mouth 2 (two) times daily.     . Multiple Vitamin (MULTIVITAMIN WITH MINERALS) TABS tablet Take 1 tablet by mouth daily. Centrum Silver Multivitamin    . omeprazole (PRILOSEC) 40 MG capsule Take 1 capsule (40 mg total) by mouth daily. 90 capsule 3  . potassium chloride (K-DUR) 10 MEQ tablet Take 10 mEq by mouth daily.     . ranitidine (ZANTAC) 150 MG tablet Take 150 mg by mouth 2 (two) times daily.    Marland Kitchen  tamsulosin (FLOMAX) 0.4 MG CAPS capsule Take 1 capsule (0.4 mg total) by mouth daily after supper. 30 capsule 11  . hydrochlorothiazide (HYDRODIURIL) 25 MG tablet Take 25 mg by mouth daily.     . Meth-Hyo-M Bl-Na Phos-Ph Sal (URIBEL) 118 MG CAPS 1 tablet 4x daily (Patient not taking: Reported on 01/14/2018) 120 capsule 1   No current facility-administered medications for this visit.     Allergies as of 01/28/2018  . (No Known Allergies)    ROS:  General: Negative for anorexia, weight loss, fever, chills, fatigue, weakness. ENT: Negative for hoarseness, difficulty swallowing , nasal congestion. CV: Negative for chest pain, angina, palpitations, dyspnea on exertion, peripheral edema.  Respiratory: Negative for dyspnea at rest, dyspnea on exertion, cough, sputum, wheezing.  GI: See history of  present illness. GU:  Negative for dysuria, hematuria, urinary incontinence, urinary frequency, nocturnal urination.  Endo: Negative for unusual weight change.    Physical Examination:   BP (!) 148/86   Pulse 68   Ht 5\' 9"  (1.753 m)   Wt 217 lb 9.6 oz (98.7 kg)   BMI 32.13 kg/m   General: Well-nourished, well-developed in no acute distress.  Eyes: No icterus. Conjunctivae pink. Mouth: Oropharyngeal mucosa moist and pink , no lesions erythema or exudate. Lungs: Clear to auscultation bilaterally. Non-labored. Heart: Regular rate and rhythm, no murmurs rubs or gallops.  Abdomen: Bowel sounds are normal, nontender, nondistended, no hepatosplenomegaly or masses, no abdominal bruits or hernia , no rebound or guarding.   Extremities: No lower extremity edema. No clubbing or deformities. Neuro: Alert and oriented x 3.  Grossly intact. Skin: Warm and dry, no jaundice.   Psych: Alert and cooperative, normal mood and affect.   Imaging Studies: US Abdomen Limited Ruq  Result Date: 01/16/2018 CLINICAL DATA:  Right upper quadrant pain. History of previous cholecystectomy. Possible common bile duct stones. EXAM: ULTRASOUND ABDOMEN LIMITED RIGHT UPPER QUADRANT COMPARISON:  Pre cholecystectomy abdominal ultrasound of October 30, 2016. FINDINGS: Gallbladder: The gallbladder is surgically absent. Common bile duct: Diameter: 6 mm.  No abnormal intraluminal echoes are observed. Liver: The hepatic echotexture is mildly increased diffusely. There is at simple appearing cyst in the right hepatic lobe measuring 3.3 x 2.6 x 2.9 cm. There is a smaller simple appearing cyst in the right hepatic lobe measuring 1.5 x 1.7 x 1.6 cm. There is no intrahepatic ductal dilation nor solid mass. Portal vein is patent on color Doppler imaging with normal direction of blood flow towards the liver. IMPRESSION: Simple appearing cysts in the right hepatic lobe which have been previously demonstrated. Normal caliber common bile duct  for the post cholecystectomy state. No intraluminal stones or sludge are observed. Electronically Signed   By: David  Martinique M.D.   On: 01/16/2018 12:34    Assessment and Plan:   Brandon Benjamin is a 72 y.o. y/o male  here to follow up for iron deficiency anemia. EGD showed a large hiatal hernia and edematous GE junction with bx showing no malignancy. Colonoscopy showed a few colon polyps that were excised.Normal capsule study of the small bowel . Likely cause of iron deficiency anemia is the large hiatal hernia that was seen. On his last endoscopy a 10 mm gastric polyp was seen which was biopsied and shows a fundic gland polyp. Candidiasis also noted. Doing well on IV iron. RUQ pain - unclear etiology .     Plan  1. EGD to resect the large fundic gland polyp seen in the stomach occasionally  can bleed and be a cause of anemia.already scheduled 02/09/18  2.Trial of Bentyl and IB guard- samples given for IB guard for abdominal pain and if no better at next visit will get CT abdomen    Dr [name]  MD,MRCP [facility]) Follow up in 8-12 weeks

## 2018-01-29 ENCOUNTER — Telehealth: Payer: Self-pay

## 2018-01-29 NOTE — Telephone Encounter (Signed)
Patients wife contacted office. She stated that IB Guard worked very well for her husbands stomach pain.  She requested a prescription.  I informed her that no prescription is needed for IBGuard-it's available over the counter, and I informed her that she could come by the office for a coupon if she wanted.  Thanks [employer]

## 2018-02-09 ENCOUNTER — Ambulatory Visit: Payer: Medicare Other | Admitting: Anesthesiology

## 2018-02-09 ENCOUNTER — Ambulatory Visit
Admission: RE | Admit: 2018-02-09 | Discharge: 2018-02-09 | Disposition: A | Discharge status: Home / Self Care | Payer: Medicare Other | Source: Ambulatory Visit | Attending: Gastroenterology | Admitting: Gastroenterology

## 2018-02-09 ENCOUNTER — Encounter: Payer: Self-pay | Admitting: *Deleted

## 2018-02-09 ENCOUNTER — Encounter
Admission: RE | Disposition: A | Discharge status: Home / Self Care | Payer: Self-pay | Source: Ambulatory Visit | Attending: Gastroenterology

## 2018-02-09 DIAGNOSIS — Z7984 Long term (current) use of oral hypoglycemic drugs: Secondary | ICD-10-CM | POA: Diagnosis not present

## 2018-02-09 DIAGNOSIS — K317 Polyp of stomach and duodenum: Secondary | ICD-10-CM

## 2018-02-09 DIAGNOSIS — I10 Essential (primary) hypertension: Secondary | ICD-10-CM | POA: Diagnosis not present

## 2018-02-09 DIAGNOSIS — Z6831 Body mass index (BMI) 31.0-31.9, adult: Secondary | ICD-10-CM | POA: Diagnosis not present

## 2018-02-09 DIAGNOSIS — E119 Type 2 diabetes mellitus without complications: Secondary | ICD-10-CM | POA: Diagnosis not present

## 2018-02-09 DIAGNOSIS — N401 Enlarged prostate with lower urinary tract symptoms: Secondary | ICD-10-CM | POA: Diagnosis not present

## 2018-02-09 DIAGNOSIS — Z7951 Long term (current) use of inhaled steroids: Secondary | ICD-10-CM | POA: Diagnosis not present

## 2018-02-09 DIAGNOSIS — K219 Gastro-esophageal reflux disease without esophagitis: Secondary | ICD-10-CM | POA: Insufficient documentation

## 2018-02-09 DIAGNOSIS — E669 Obesity, unspecified: Secondary | ICD-10-CM | POA: Insufficient documentation

## 2018-02-09 DIAGNOSIS — K449 Diaphragmatic hernia without obstruction or gangrene: Secondary | ICD-10-CM | POA: Diagnosis not present

## 2018-02-09 DIAGNOSIS — E785 Hyperlipidemia, unspecified: Secondary | ICD-10-CM | POA: Diagnosis not present

## 2018-02-09 DIAGNOSIS — Z87891 Personal history of nicotine dependence: Secondary | ICD-10-CM | POA: Insufficient documentation

## 2018-02-09 DIAGNOSIS — N4830 Priapism, unspecified: Secondary | ICD-10-CM | POA: Insufficient documentation

## 2018-02-09 DIAGNOSIS — F419 Anxiety disorder, unspecified: Secondary | ICD-10-CM | POA: Insufficient documentation

## 2018-02-09 HISTORY — PX: ESOPHAGOGASTRODUODENOSCOPY (EGD) WITH PROPOFOL: SHX5813

## 2018-02-09 LAB — GLUCOSE, CAPILLARY: Glucose-Capillary: 146 mg/dL — ABNORMAL HIGH (ref 70–99)

## 2018-02-09 SURGERY — ESOPHAGOGASTRODUODENOSCOPY (EGD) WITH PROPOFOL
Anesthesia: General

## 2018-02-09 MED ORDER — LIDOCAINE HCL (PF) 2 % IJ SOLN
INTRAMUSCULAR | Status: AC
  Filled 2018-02-09: qty 10

## 2018-02-09 MED ORDER — SODIUM CHLORIDE 0.9 % IV SOLN
INTRAVENOUS | Status: DC
  Administered 2018-02-09: 1000 mL via INTRAVENOUS

## 2018-02-09 MED ORDER — LIDOCAINE HCL (CARDIAC) PF 100 MG/5ML IV SOSY
PREFILLED_SYRINGE | INTRAVENOUS | Status: DC | PRN
  Administered 2018-02-09: 20 mg via INTRAVENOUS

## 2018-02-09 MED ORDER — PROPOFOL 10 MG/ML IV BOLUS
INTRAVENOUS | Status: DC | PRN
  Administered 2018-02-09: 50 mg via INTRAVENOUS
  Administered 2018-02-09: 70 mg via INTRAVENOUS

## 2018-02-09 MED ORDER — SODIUM CHLORIDE (PF) 0.9 % IJ SOLN
INTRAMUSCULAR | Status: DC | PRN
  Administered 2018-02-09: 9 mL via INTRAVENOUS

## 2018-02-09 MED ORDER — PROPOFOL 500 MG/50ML IV EMUL
INTRAVENOUS | Status: DC | PRN
  Administered 2018-02-09: 150 ug/kg/min via INTRAVENOUS

## 2018-02-09 MED ORDER — PROPOFOL 500 MG/50ML IV EMUL
INTRAVENOUS | Status: AC
  Filled 2018-02-09: qty 50

## 2018-02-09 NOTE — Anesthesia Post-op Follow-up Note (Signed)
Anesthesia QCDR form completed.        

## 2018-02-09 NOTE — Anesthesia Postprocedure Evaluation (Signed)
Anesthesia Post Note  Patient: Brandon Benjamin  Procedure(s) Performed: ESOPHAGOGASTRODUODENOSCOPY (EGD) WITH PROPOFOL (N/A )  Patient location during evaluation: Endoscopy Anesthesia Type: General Level of consciousness: awake and alert and oriented Pain management: pain level controlled Vital Signs Assessment: post-procedure vital signs reviewed and stable Respiratory status: spontaneous breathing, nonlabored ventilation and respiratory function stable Cardiovascular status: blood pressure returned to baseline and stable Postop Assessment: no signs of nausea or vomiting Anesthetic complications: no     Last Vitals:  Vitals:   02/09/18 0840 02/09/18 0905  BP:  139/84  Pulse:    Resp:    Temp: 36.6 C   SpO2:      Last Pain:  Vitals:   02/09/18 0911  TempSrc:   PainSc: 0-No pain                 Idris Edmundson

## 2018-02-09 NOTE — H&P (Signed)
[name], MD [address], [place], [place], [place], [address] [address], [place], [place], [place], [address] Phone: [phone]  Fax: [phone]  Primary Care Physician:  Letta Median, MD   Pre-Procedure History & Physical: HPI:  Brandon Benjamin is a 73 y.o. male is here for an endoscopy    Past Medical History:  Diagnosis Date  . Anemia   . Anxiety   . Balanitis   . Balanitis   . BPH (benign prostatic hyperplasia)   . Diabetes ([place])   . Erectile dysfunction   . GERD (gastroesophageal reflux disease)   . History of kidney stones   . HLD (hyperlipidemia)   . HTN (hypertension)   . Iron deficiency anemia due to chronic blood loss 09/04/2017  . Over weight   . Priapism   . Scrotal cyst   . Urinary urgency     Past Surgical History:  Procedure Laterality Date  . CHOLECYSTECTOMY N/A 12/25/2016   Procedure: LAPAROSCOPIC CHOLECYSTECTOMY WITH INTRAOPERATIVE CHOLANGIOGRAM;  Surgeon: Jules Husbands, MD;  Location: [facility] ORS;  Service: General;  Laterality: N/A;  . COLONOSCOPY WITH PROPOFOL N/A 09/09/2017   Procedure: COLONOSCOPY WITH PROPOFOL;  Surgeon: [name], MD;  Location: [facility] ENDOSCOPY;  Service: Gastroenterology;  Laterality: N/A;  . CYSTOSCOPY WITH LITHOLAPAXY N/A 07/16/2017   Procedure: CYSTOSCOPY WITH LITHOLAPAXY;  Surgeon: Hollice Espy, MD;  Location: [facility] ORS;  Service: Urology;  Laterality: N/A;  . ERCP N/A 11/28/2016   Procedure: ENDOSCOPIC RETROGRADE CHOLANGIOPANCREATOGRAPHY (ERCP);  Surgeon: [name], MD;  Location: [facility] ENDOSCOPY;  Service: Endoscopy;  Laterality: N/A;  . ESOPHAGOGASTRODUODENOSCOPY (EGD) WITH PROPOFOL N/A 09/09/2017   Procedure: ESOPHAGOGASTRODUODENOSCOPY (EGD) WITH PROPOFOL;  Surgeon: [name], MD;  Location: [facility] ENDOSCOPY;  Service: Gastroenterology;  Laterality: N/A;  . ESOPHAGOGASTRODUODENOSCOPY (EGD) WITH PROPOFOL N/A 12/01/2017   Procedure: ESOPHAGOGASTRODUODENOSCOPY (EGD) WITH PROPOFOL;  Surgeon: [name],  MD;  Location: [facility] ENDOSCOPY;  Service: Gastroenterology;  Laterality: N/A;  . GIVENS CAPSULE STUDY N/A 12/01/2017   Procedure: GIVENS CAPSULE STUDY;  Surgeon: [name], MD;  Location: [facility] ENDOSCOPY;  Service: Gastroenterology;  Laterality: N/A;  . HOLEP-LASER ENUCLEATION OF THE PROSTATE WITH MORCELLATION N/A 07/11/2015   Procedure: HOLEP-LASER ENUCLEATION OF THE PROSTATE WITH MORCELLATION;  Surgeon: Hollice Espy, MD;  Location: [facility] ORS;  Service: Urology;  Laterality: N/A;  . TRANSURETHRAL RESECTION OF PROSTATE N/A 07/16/2017   Procedure: TRANSURETHRAL RESECTION OF THE PROSTATE (TURP);  Surgeon: Hollice Espy, MD;  Location: [facility] ORS;  Service: Urology;  Laterality: N/A;  . UMBILICAL HERNIA REPAIR  12/25/2016   Procedure: HERNIA REPAIR UMBILICAL ADULT;  Surgeon: Jules Husbands, MD;  Location: [facility] ORS;  Service: General;;    Prior to Admission medications   Medication Sig Start Date End Date Taking? Authorizing Provider  ACCU-CHEK AVIVA PLUS test strip  08/04/17  Yes [provider]  ACCU-CHEK SOFTCLIX LANCETS lancets  09/26/17  Yes [provider]  acetaminophen (TYLENOL 8 HOUR ARTHRITIS PAIN) 650 MG CR tablet Take 650 mg by mouth every 8 (eight) hours as needed for pain.   Yes [provider]  amLODipine (NORVASC) 10 MG tablet Take 10 mg by mouth daily.   Yes [provider]  cetirizine (ZYRTEC) 10 MG tablet Take 10 mg by mouth daily.   Yes [provider]  citalopram (CELEXA) 10 MG tablet Take 10 mg by mouth daily.   Yes [provider]  dicyclomine (BENTYL) 10 MG capsule Take 1 capsule (10 mg total) by  mouth 4 (four) times daily -  before meals and at bedtime. 01/28/18  Yes [name], MD  docusate sodium (COLACE) 100 MG capsule Take 100 mg by mouth 2 (two) times daily.   Yes [provider]  fluticasone (FLONASE) 50 MCG/ACT nasal spray Place 2 sprays into both nostrils daily.  08/31/13  Yes [provider]    hydrALAZINE (APRESOLINE) 25 MG tablet  08/26/17  Yes [provider]  hydrochlorothiazide (HYDRODIURIL) 25 MG tablet Take 25 mg by mouth daily.    Yes [provider]  linaclotide Rolan Lipa) 290 MCG CAPS capsule Take 1 capsule (290 mcg total) by mouth daily before breakfast. 01/28/18 04/30/18 Yes [name], MD  lisinopril (PRINIVIL,ZESTRIL) 40 MG tablet Take 40 mg by mouth daily.   Yes [provider]  lovastatin (MEVACOR) 20 MG tablet Take 20 mg by mouth at bedtime.   Yes [provider]  metFORMIN (GLUCOPHAGE) 500 MG tablet Take 500 mg by mouth 2 (two) times daily.  04/05/15  Yes [provider]  Meth-Hyo-M Bl-Na Phos-Ph Sal (URIBEL) 118 MG CAPS 1 tablet 4x daily 06/06/17  Yes McGowan, Larene Beach A, PA-C  metoprolol (LOPRESSOR) 100 MG tablet Take 100 mg by mouth 2 (two) times daily.  05/10/13  Yes [provider]  Multiple Vitamin (MULTIVITAMIN WITH MINERALS) TABS tablet Take 1 tablet by mouth daily. Centrum Silver Multivitamin   Yes [provider]  omeprazole (PRILOSEC) 40 MG capsule Take 1 capsule (40 mg total) by mouth daily. 09/22/17  Yes [name], MD  potassium chloride (K-DUR) 10 MEQ tablet Take 10 mEq by mouth daily.  12/23/16  Yes [provider]  ranitidine (ZANTAC) 150 MG tablet Take 150 mg by mouth 2 (two) times daily.   Yes [provider]  ferrous sulfate 325 (65 FE) MG tablet Take 325 mg by mouth 3 (three) times daily with meals.    [provider]  tamsulosin (FLOMAX) 0.4 MG CAPS capsule Take 1 capsule (0.4 mg total) by mouth daily after supper. Patient not taking: Reported on 02/09/2018 05/08/17   Zara Council A, PA-C    Allergies as of 12/09/2017  . (No Known Allergies)    Family History  Problem Relation Age of Onset  . Cancer Mother        blood stream  . High blood pressure Mother   . Hypertension Sister   . Anemia Sister   . Prostate cancer Neg Hx   . Bladder Cancer Neg Hx   .  Kidney disease Neg Hx     Social History   Socioeconomic History  . Marital status: Married    Spouse name: Not on file  . Number of children: Not on file  . Years of education: Not on file  . Highest education level: Not on file  Occupational History  . Not on file  Social Needs  . Financial resource strain: Not on file  . Food insecurity:    Worry: Not on file    Inability: Not on file  . Transportation needs:    Medical: Not on file    Non-medical: Not on file  Tobacco Use  . Smoking status: Former Smoker    Types: Cigarettes    Last attempt to quit: 03/26/1995    Years since quitting: 22.8  . Smokeless tobacco: Never Used  Substance and Sexual Activity  . Alcohol use: No  . Drug use: No  . Sexual activity: Not on file  Lifestyle  . Physical activity:  Days per week: Not on file    Minutes per session: Not on file  . Stress: Not on file  Relationships  . Social connections:    Talks on phone: Not on file    Gets together: Not on file    Attends religious service: Not on file    Active member of club or organization: Not on file    Attends meetings of clubs or organizations: Not on file    Relationship status: Not on file  . Intimate partner violence:    Fear of current or ex partner: Not on file    Emotionally abused: Not on file    Physically abused: Not on file    Forced sexual activity: Not on file  Other Topics Concern  . Not on file  Social History Narrative  . Not on file    Review of Systems: See HPI, otherwise negative ROS  Physical Exam: BP (!) 170/97   Pulse (!) 58   Temp (!) 97.5 F (36.4 C) (Tympanic)   Resp 20   Ht 5\' 9"  (1.753 m)   Wt 97.1 kg   SpO2 100%   BMI 31.60 kg/m  General:   Alert,  pleasant and cooperative in NAD Head:  Normocephalic and atraumatic. Neck:  Supple; no masses or thyromegaly. Lungs:  Clear throughout to auscultation, normal respiratory effort.    Heart:  +S1, +S2, Regular rate and rhythm, No  edema. Abdomen:  Soft, nontender and nondistended. Normal bowel sounds, without guarding, and without rebound.   Neurologic:  Alert and  oriented x4;  grossly normal neurologically.  Impression/Plan: Brandon Benjamin is here for an endoscopy  to be performed for  evaluation of a gastric polyp    Risks, benefits, limitations, and alternatives regarding endoscopy have been reviewed with the patient.  Questions have been answered.  All parties agreeable.   [name], MD  02/09/2018, 8:16 AM

## 2018-02-09 NOTE — Transfer of Care (Addendum)
Immediate Anesthesia Transfer of Care Note  Patient: Brandon Benjamin  Procedure(s) Performed: ESOPHAGOGASTRODUODENOSCOPY (EGD) WITH PROPOFOL (N/A )  Patient Location: PACU and Endoscopy Unit  Anesthesia Type:General  Level of Consciousness: awake  Airway & Oxygen Therapy: Patient Spontanous Breathing  Post-op Assessment: Report given to RN  Post vital signs: stable  Last Vitals:  Vitals Value Taken Time  BP 107/69 02/09/2018  8:39 AM  Temp    Pulse 51 02/09/2018  8:39 AM  Resp 18 02/09/2018  8:39 AM  SpO2 96 % 02/09/2018  8:39 AM  Vitals shown include unvalidated device data.  Last Pain:  Vitals:   02/09/18 0719  TempSrc: Tympanic  PainSc: 0-No pain      Patients Stated Pain Goal: 0 (83/09/40 7680)  Complications: No apparent anesthesia complications

## 2018-02-09 NOTE — Anesthesia Preprocedure Evaluation (Signed)
Anesthesia Evaluation  Patient identified by MRN, date of birth, ID band Patient awake    Reviewed: Allergy & Precautions, NPO status , Patient's Chart, lab work & pertinent test results  History of Anesthesia Complications Negative for: history of anesthetic complications  Airway Mallampati: III  TM Distance: >3 FB Neck ROM: Full    Dental  (+) Edentulous Upper, Edentulous Lower   Pulmonary neg sleep apnea, neg COPD, former smoker,    breath sounds clear to auscultation- rhonchi (-) wheezing      Cardiovascular Exercise Tolerance: Good hypertension, Pt. on medications (-) CAD, (-) Past MI, (-) Cardiac Stents and (-) CABG  Rhythm:Regular Rate:Normal - Systolic murmurs and - Diastolic murmurs    Neuro/Psych Anxiety negative neurological ROS     GI/Hepatic Neg liver ROS, GERD  ,  Endo/Other  diabetes, Oral Hypoglycemic Agents  Renal/GU Renal InsufficiencyRenal disease     Musculoskeletal   Abdominal (+) + obese,   Peds  Hematology  (+) anemia ,   Anesthesia Other Findings Past Medical History: No date: Anemia No date: Anxiety No date: Balanitis No date: Balanitis No date: BPH (benign prostatic hyperplasia) No date: Diabetes ([facility]) No date: Erectile dysfunction No date: GERD (gastroesophageal reflux disease) No date: History of kidney stones No date: HLD (hyperlipidemia) No date: HTN (hypertension) 09/04/2017: Iron deficiency anemia due to chronic blood loss No date: Over weight No date: Priapism No date: Scrotal cyst No date: Urinary urgency   Reproductive/Obstetrics                             Anesthesia Physical Anesthesia Plan  ASA: III  Anesthesia Plan: General   Post-op Pain Management:    Induction: Intravenous  PONV Risk Score and Plan: 1 and Propofol infusion  Airway Management Planned: Natural Airway  Additional Equipment:   Intra-op Plan:   Post-operative  Plan:   Informed Consent: I have reviewed the patients History and Physical, chart, labs and discussed the procedure including the risks, benefits and alternatives for the proposed anesthesia with the patient or authorized representative who has indicated his/her understanding and acceptance.   Dental advisory given  Plan Discussed with: CRNA and Anesthesiologist  Anesthesia Plan Comments:         Anesthesia Quick Evaluation

## 2018-02-09 NOTE — Op Note (Signed)
[facility] Gastroenterology Patient Name: Brandon Benjamin Procedure Date: 02/09/2018 8:21 AM MRN: [id number] Account #: [id number] Date of Birth: [birth year] Admit Type: Outpatient Age: 72 Room: [facility] ENDO ROOM 4 Gender: Male Note Status: Finalized Procedure:            Upper GI endoscopy Indications:          For therapy of gastric polyps Providers:            [name] MD, MD Referring MD:         Baxter Kail. Rebeca Alert MD, MD (Referring MD) Medicines:            Monitored Anesthesia Care Complications:        No immediate complications. Procedure:            Pre-Anesthesia Assessment:                       - Prior to the procedure, a History and Physical was                        performed, and patient medications, allergies and                        sensitivities were reviewed. The patient's tolerance of                        previous anesthesia was reviewed.                       - The risks and benefits of the procedure and the                        sedation options and risks were discussed with the                        patient. All questions were answered and informed                        consent was obtained.                       - ASA Grade Assessment: III - A patient with severe                        systemic disease.                       After obtaining informed consent, the endoscope was                        passed under direct vision. Throughout the procedure,                        the patient's blood pressure, pulse, and oxygen                        saturations were monitored continuously. The Endoscope                        was introduced through the mouth, and advanced to the  third part of duodenum. The upper GI endoscopy was                        accomplished with ease. The patient tolerated the                        procedure well. Findings:      The examined duodenum was normal.      The esophagus was normal.       A large hiatal hernia was present.      A single 10 mm semi-sessile polyp with no stigmata of recent bleeding       was found on the greater curvature of the stomach. Area was successfully       injected with 9 mL saline for a lift polypectomy. The polyp was removed       with a hot snare. Resection and retrieval were complete. To prevent       bleeding after the polypectomy, one hemostatic clip was successfully       placed. There was no bleeding during, or at the end, of the procedure.      The cardia and gastric fundus were normal on retroflexion. Impression:           - Normal examined duodenum.                       - Normal esophagus.                       - Large hiatal hernia.                       - A single gastric polyp. Resected and retrieved.                        Injected. Clip was placed. Recommendation:       - Discharge patient to home (with escort).                       - Resume previous diet.                       - Continue present medications.                       - Await pathology results.                       - Return to my office as previously scheduled. Procedure Code(s):    --- Professional ---                       [phone], Esophagogastroduodenoscopy, flexible, transoral;                        with removal of tumor(s), polyp(s), or other lesion(s)                        by snare technique                       [id number], Esophagogastroduodenoscopy, flexible, transoral;  with directed submucosal injection(s), any substance Diagnosis Code(s):    --- Professional ---                       K44.9, Diaphragmatic hernia without obstruction or                        gangrene                       K31.7, Polyp of stomach and duodenum CPT copyright 2018 American Medical Association. All rights reserved. The codes documented in this report are preliminary and upon coder review [place]  be revised to meet current compliance requirements. [name],  MD [name] MD, MD 02/09/2018 8:38:33 AM This report has been signed electronically. Number of Addenda: 0 Note Initiated On: 02/09/2018 8:21 AM      [facility]

## 2018-02-10 ENCOUNTER — Telehealth: Payer: Self-pay | Admitting: Gastroenterology

## 2018-02-10 LAB — SURGICAL PATHOLOGY

## 2018-02-10 NOTE — Telephone Encounter (Signed)
Patients wife states they were told by Dr.Anna yesterday to come by office and pick up a coupon and sample of IBguard. Patients wife plans to stop by today. FYI

## 2018-02-10 NOTE — Telephone Encounter (Signed)
Pt stopped by the office. IBgard samples and coupons were given to pt.

## 2018-02-13 ENCOUNTER — Encounter: Payer: Self-pay | Admitting: Gastroenterology

## 2018-02-13 ENCOUNTER — Telehealth: Payer: Self-pay

## 2018-02-13 NOTE — Telephone Encounter (Signed)
Called pt to inform him of endoscopy biopsy results. Unable to contact. LVM to return call

## 2018-02-13 NOTE — Telephone Encounter (Signed)
Pt returned call. I have informed him of endoscopy biopsy results.

## 2018-02-13 NOTE — Telephone Encounter (Signed)
-----   Message from [name], MD sent at 02/10/2018 11:48 AM EST ----- Inform gastric polyp was benign

## 2018-02-16 ENCOUNTER — Encounter: Payer: Self-pay | Admitting: Podiatry

## 2018-02-16 ENCOUNTER — Ambulatory Visit: Payer: Medicare Other | Admitting: Podiatry

## 2018-02-16 ENCOUNTER — Ambulatory Visit (INDEPENDENT_AMBULATORY_CARE_PROVIDER_SITE_OTHER): Payer: Medicare Other | Admitting: Podiatry

## 2018-02-16 DIAGNOSIS — M79676 Pain in unspecified toe(s): Secondary | ICD-10-CM | POA: Diagnosis not present

## 2018-02-16 DIAGNOSIS — E119 Type 2 diabetes mellitus without complications: Secondary | ICD-10-CM | POA: Diagnosis not present

## 2018-02-16 DIAGNOSIS — B351 Tinea unguium: Secondary | ICD-10-CM

## 2018-02-16 NOTE — Progress Notes (Signed)
Complaint:  Visit Type: Patient returns to my office for continued preventative foot care services. Complaint: Patient states" my nails have grown long and thick and become painful to walk and wear shoes" Patient has been diagnosed with DM with no foot complications. The patient presents for preventative foot care services.   Podiatric Exam: Vascular: dorsalis pedis and posterior tibial pulses are palpable bilateral. Capillary return is immediate. Temperature gradient is WNL. Skin turgor WNL  Sensorium: Normal Semmes Weinstein monofilament test. Normal tactile sensation bilaterally. Nail Exam: Pt has thick disfigured discolored nails with subungual debris noted bilateral entire nail hallux through fifth toenails Ulcer Exam: There is no evidence of ulcer or pre-ulcerative changes or infection. Orthopedic Exam: Muscle tone and strength are WNL. No limitations in general ROM. No crepitus or effusions noted. Foot type and digits show no abnormalities. Bony prominences are unremarkable. Skin: No Porokeratosis. No infection or ulcers  Diagnosis:  Onychomycosis, , Pain in right toe, pain in left toes  Treatment & Plan Procedures and Treatment: Consent by patient was obtained for treatment procedures. The patient understood the discussion of treatment and procedures well. All questions were answered thoroughly reviewed. Debridement of mycotic and hypertrophic toenails, 1 through 5 bilateral and clearing of subungual debris. No ulceration, no infection noted.  Return Visit-Office Procedure: Patient instructed to return to the office for a follow up visit 3 months for continued evaluation and treatment.    Kerah Hardebeck DPM 

## 2018-03-04 ENCOUNTER — Encounter: Payer: Self-pay | Admitting: Urology

## 2018-03-04 ENCOUNTER — Other Ambulatory Visit: Payer: Self-pay

## 2018-03-04 ENCOUNTER — Ambulatory Visit (INDEPENDENT_AMBULATORY_CARE_PROVIDER_SITE_OTHER): Payer: Medicare Other | Admitting: Urology

## 2018-03-04 VITALS — BP 159/92 | HR 80 | Ht 69.0 in | Wt 211.0 lb

## 2018-03-04 DIAGNOSIS — R3913 Splitting of urinary stream: Secondary | ICD-10-CM | POA: Diagnosis not present

## 2018-03-04 DIAGNOSIS — R300 Dysuria: Secondary | ICD-10-CM | POA: Diagnosis not present

## 2018-03-04 DIAGNOSIS — N401 Enlarged prostate with lower urinary tract symptoms: Secondary | ICD-10-CM

## 2018-03-04 DIAGNOSIS — N138 Other obstructive and reflux uropathy: Secondary | ICD-10-CM

## 2018-03-04 LAB — URINALYSIS, COMPLETE
Bilirubin, UA: NEGATIVE
KETONES UA: NEGATIVE
LEUKOCYTES UA: NEGATIVE
Nitrite, UA: NEGATIVE
PH UA: 5.5 (ref 5.0–7.5)
Protein, UA: NEGATIVE
RBC, UA: NEGATIVE
SPEC GRAV UA: 1.02 (ref 1.005–1.030)
Urobilinogen, Ur: 0.2 mg/dL (ref 0.2–1.0)

## 2018-03-04 LAB — MICROSCOPIC EXAMINATION: EPITHELIAL CELLS (NON RENAL): NONE SEEN /HPF (ref 0–10)

## 2018-03-04 LAB — BLADDER SCAN AMB NON-IMAGING

## 2018-03-04 NOTE — Progress Notes (Signed)
03/04/2018 10:42 AM   Brandon Benjamin 19-Dec-1945 [id number]  Referring provider: Letta Median, MD [place], [place] [address]  Chief Complaint  Patient presents with  . Dysuria  . Benign Prostatic Hypertrophy    HPI: Brandon Benjamin is a 62 y M who returns for a 6 month f/u for the evaluation and management of BPH with urinary obstruction and dysuria.   He continues to c/o of burning at the tip of penis while urinating and its disappearance 5 min after he urinates. This burning sensation has been present since his last surgery but is slowing improving. He also reports of a split urinary stream.   He reports of no leakage and is pleased overall with his urinary symtpoms.   He discontinued Uribel, Flomax.   His PVR is 8 mL. His UA is negative.   IPSS below.   Background History:  He initially presented with mixed obstructive and irritative voiding symptoms on maximal medical therapy.  He was found to have massive prostamegaly, TRUS volume 112 g.  He underwent holep in 06/2015 at which time 61 g was resected, pathology benign.  He had dramatic improvement in his urinary symptoms and he was weaned off of his medications. He returned complaining of dysuria and was found to have a small bladder stone as well as right lateral prostatic regrowth and office cystoscopy.  He returned to the operating room on 07/17/2017 for TURP as well as treatment of a 1 cm bladder stone.  An additional 8 g of tissue was resected from the right lateral lobe, consistent with benign glandular and stromal hyperplasia, negative for malignancy. Uroflow on 08/21/2017 was with excellent parabolic curve, qmax 27 cc, void 131 mL, postvoid residual 5 cc.   IPSS    Row Name 03/04/18 0900         International Prostate Symptom Score   How often have you had the sensation of not emptying your bladder?  Less than 1 in 5     How often have you had to urinate less than every two hours?  Less  than half the time     How often have you found you stopped and started again several times when you urinated?  Less than 1 in 5 times     How often have you found it difficult to postpone urination?  Less than half the time     How often have you had a weak urinary stream?  About half the time     How often have you had to strain to start urination?  Less than 1 in 5 times     How many times did you typically get up at night to urinate?  3 Times     Total IPSS Score  13       Quality of Life due to urinary symptoms   If you were to spend the rest of your life with your urinary condition just the way it is now how would you feel about that?  Delighted          PMH: Past Medical History:  Diagnosis Date  . Anemia   . Anxiety   . Balanitis   . Balanitis   . BPH (benign prostatic hyperplasia)   . Diabetes ([place])   . Erectile dysfunction   . GERD (gastroesophageal reflux disease)   . History of kidney stones   . HLD (hyperlipidemia)   . HTN (hypertension)   . Iron  deficiency anemia due to chronic blood loss 09/04/2017  . Over weight   . Priapism   . Scrotal cyst   . Urinary urgency     Surgical History: Past Surgical History:  Procedure Laterality Date  . CHOLECYSTECTOMY N/A 12/25/2016   Procedure: LAPAROSCOPIC CHOLECYSTECTOMY WITH INTRAOPERATIVE CHOLANGIOGRAM;  Surgeon: Jules Husbands, MD;  Location: [facility] ORS;  Service: General;  Laterality: N/A;  . COLONOSCOPY WITH PROPOFOL N/A 09/09/2017   Procedure: COLONOSCOPY WITH PROPOFOL;  Surgeon: [name], MD;  Location: [facility] ENDOSCOPY;  Service: Gastroenterology;  Laterality: N/A;  . CYSTOSCOPY WITH LITHOLAPAXY N/A 07/16/2017   Procedure: CYSTOSCOPY WITH LITHOLAPAXY;  Surgeon: Hollice Espy, MD;  Location: [facility] ORS;  Service: Urology;  Laterality: N/A;  . ERCP N/A 11/28/2016   Procedure: ENDOSCOPIC RETROGRADE CHOLANGIOPANCREATOGRAPHY (ERCP);  Surgeon: [name], MD;  Location: [facility] ENDOSCOPY;  Service: Endoscopy;  Laterality: N/A;   . ESOPHAGOGASTRODUODENOSCOPY (EGD) WITH PROPOFOL N/A 09/09/2017   Procedure: ESOPHAGOGASTRODUODENOSCOPY (EGD) WITH PROPOFOL;  Surgeon: [name], MD;  Location: [facility] ENDOSCOPY;  Service: Gastroenterology;  Laterality: N/A;  . ESOPHAGOGASTRODUODENOSCOPY (EGD) WITH PROPOFOL N/A 12/01/2017   Procedure: ESOPHAGOGASTRODUODENOSCOPY (EGD) WITH PROPOFOL;  Surgeon: [name], MD;  Location: [facility] ENDOSCOPY;  Service: Gastroenterology;  Laterality: N/A;  . ESOPHAGOGASTRODUODENOSCOPY (EGD) WITH PROPOFOL N/A 02/09/2018   Procedure: ESOPHAGOGASTRODUODENOSCOPY (EGD) WITH PROPOFOL;  Surgeon: [name], MD;  Location: [facility] ENDOSCOPY;  Service: Gastroenterology;  Laterality: N/A;  . GIVENS CAPSULE STUDY N/A 12/01/2017   Procedure: GIVENS CAPSULE STUDY;  Surgeon: [name], MD;  Location: [facility] ENDOSCOPY;  Service: Gastroenterology;  Laterality: N/A;  . HOLEP-LASER ENUCLEATION OF THE PROSTATE WITH MORCELLATION N/A 07/11/2015   Procedure: HOLEP-LASER ENUCLEATION OF THE PROSTATE WITH MORCELLATION;  Surgeon: Hollice Espy, MD;  Location: [facility] ORS;  Service: Urology;  Laterality: N/A;  . TRANSURETHRAL RESECTION OF PROSTATE N/A 07/16/2017   Procedure: TRANSURETHRAL RESECTION OF THE PROSTATE (TURP);  Surgeon: Hollice Espy, MD;  Location: [facility] ORS;  Service: Urology;  Laterality: N/A;  . UMBILICAL HERNIA REPAIR  12/25/2016   Procedure: HERNIA REPAIR UMBILICAL ADULT;  Surgeon: Jules Husbands, MD;  Location: [facility] ORS;  Service: General;;    Home Medications:  Allergies as of 03/04/2018   No Known Allergies     Medication List        Accurate as of 03/04/18 10:42 AM. Always use your most recent med list.          ACCU-CHEK AVIVA PLUS test strip Generic drug:  glucose blood   ACCU-CHEK SOFTCLIX LANCETS lancets   amLODipine 10 MG tablet Commonly known as:  NORVASC Take 10 mg by mouth daily.   cetirizine 10 MG tablet Commonly known as:  ZYRTEC Take 10 mg by mouth daily.   citalopram 10 MG  tablet Commonly known as:  CELEXA Take 10 mg by mouth daily.   dicyclomine 10 MG capsule Commonly known as:  BENTYL Take 1 capsule (10 mg total) by mouth 4 (four) times daily -  before meals and at bedtime.   docusate sodium 100 MG capsule Commonly known as:  COLACE Take 100 mg by mouth 2 (two) times daily.   ferrous sulfate 325 (65 FE) MG tablet Take 325 mg by mouth 3 (three) times daily with meals.   fluticasone 50 MCG/ACT nasal spray Commonly known as:  FLONASE Place 2 sprays into both nostrils daily.   hydrALAZINE 25 MG tablet Commonly known as:  APRESOLINE   hydrochlorothiazide 25 MG tablet Commonly known as:  HYDRODIURIL Take 25 mg by mouth  daily.   linaclotide 290 MCG Caps capsule Commonly known as:  LINZESS Take 1 capsule (290 mcg total) by mouth daily before breakfast.   lisinopril 40 MG tablet Commonly known as:  PRINIVIL,ZESTRIL Take 40 mg by mouth daily.   lovastatin 20 MG tablet Commonly known as:  MEVACOR Take 20 mg by mouth at bedtime.   metFORMIN 500 MG tablet Commonly known as:  GLUCOPHAGE Take 500 mg by mouth 2 (two) times daily.   metoprolol tartrate 100 MG tablet Commonly known as:  LOPRESSOR Take 100 mg by mouth 2 (two) times daily.   multivitamin with minerals Tabs tablet Take 1 tablet by mouth daily. Centrum Silver Multivitamin   omeprazole 40 MG capsule Commonly known as:  PRILOSEC Take 1 capsule (40 mg total) by mouth daily.   potassium chloride 10 MEQ tablet Commonly known as:  K-DUR Take 10 mEq by mouth daily.   ranitidine 150 MG tablet Commonly known as:  ZANTAC Take 150 mg by mouth 2 (two) times daily.   TYLENOL 8 HOUR ARTHRITIS PAIN 650 MG CR tablet Generic drug:  acetaminophen Take 650 mg by mouth every 8 (eight) hours as needed for pain.       Allergies: No Known Allergies  Family History: Family History  Problem Relation Age of Onset  . Cancer Mother        blood stream  . High blood pressure Mother   .  Hypertension Sister   . Anemia Sister   . Prostate cancer Neg Hx   . Bladder Cancer Neg Hx   . Kidney disease Neg Hx     Social History:  reports that he quit smoking about 22 years ago. His smoking use included cigarettes. He has never used smokeless tobacco. He reports that he does not drink alcohol or use drugs.  ROS: UROLOGY Frequent Urination?: No Hard to postpone urination?: No Burning/pain with urination?: Yes Get up at night to urinate?: No Leakage of urine?: No Urine stream starts and stops?: No Trouble starting stream?: No Do you have to strain to urinate?: No Blood in urine?: No Urinary tract infection?: No Sexually transmitted disease?: No Injury to kidneys or bladder?: No Painful intercourse?: No Weak stream?: Yes Erection problems?: No Penile pain?: No  Gastrointestinal Nausea?: No Vomiting?: No Indigestion/heartburn?: No Diarrhea?: No Constipation?: No  Constitutional Fever: No Night sweats?: No Weight loss?: No Fatigue?: No  Skin Skin rash/lesions?: No Itching?: No  Eyes Blurred vision?: No Double vision?: No  Ears/Nose/Throat Sore throat?: No Sinus problems?: No  Hematologic/Lymphatic Swollen glands?: No Easy bruising?: No  Cardiovascular Leg swelling?: No Chest pain?: No  Respiratory Cough?: No Shortness of breath?: No  Endocrine Excessive thirst?: No  Musculoskeletal Back pain?: No Joint pain?: No  Neurological Headaches?: No Dizziness?: No  Psychologic Depression?: No Anxiety?: No  Physical Exam: BP (!) 159/92   Pulse 80   Ht 5\' 9"  (1.753 m)   Wt 211 lb (95.7 kg)   BMI 31.16 kg/m   Constitutional:  Alert and oriented, No acute distress. HEENT: [place] AT, moist mucus membranes.  Trachea midline, no masses. Cardiovascular: No clubbing, cyanosis, or edema. Respiratory: Normal respiratory effort, no increased work of breathing. Skin: No rashes, bruises or suspicious lesions. Neurologic: Grossly intact, no focal  deficits, moving all 4 extremities. Psychiatric: Normal mood and affect.  Urinalysis His UA is negative.  See epic for details.  Pertinent Imaging: Results for orders placed or performed in visit on 03/04/18  Bladder Scan (Post Void Residual)  in office  Result Value Ref Range   Scan Result 89ml      Assessment & Plan:    1. BPH with urinary obstruction  Minimal obstructive urinary symptoms with adequate bladder emptying  IPSS excellent today Will repeat screening PSA / DRE next year depending on overall health status at that time -BLADDER SCAN AMB NON-IMAGING  2. Dysuria UA is negative  Do not suspect infection as cause - Urinalysis, Complete   3. Split urinary stream  -?possible stricture -cystoscopy scheduled to evaluate #2 and #3   Return for cystoscopy after Christmas   Return for cysto after christmas .  Hollice Espy, MD  [facility] [address] [place], [place] [address] [phone]  I, Lucas Mallow, am acting as a scribe for Dr. Hollice Espy,  I have reviewed the above documentation for accuracy and completeness, and I agree with the above.   Hollice Espy, MD

## 2018-04-15 ENCOUNTER — Telehealth: Payer: Self-pay | Admitting: Gastroenterology

## 2018-04-15 NOTE — Telephone Encounter (Signed)
WIFE CALLED About about Ibgard over the counter but very pricey. She would like to know if he needs to take everyday or just when he has a flare up?

## 2018-04-16 NOTE — Telephone Encounter (Signed)
Spoke with pt spouse, Pamala Hurry, and informed her of Dr. Georgeann Oppenheim directions. She states the medication has helped. Pt plans to pick up more samples.

## 2018-04-16 NOTE — Telephone Encounter (Signed)
Take as needed- can be given few more samples- enquire if it is helping?

## 2018-04-21 ENCOUNTER — Encounter: Payer: Self-pay | Admitting: Urology

## 2018-04-21 ENCOUNTER — Ambulatory Visit (INDEPENDENT_AMBULATORY_CARE_PROVIDER_SITE_OTHER): Payer: Medicare Other | Admitting: Urology

## 2018-04-21 VITALS — BP 163/93 | HR 61 | Ht 69.0 in | Wt 211.0 lb

## 2018-04-21 DIAGNOSIS — N210 Calculus in bladder: Secondary | ICD-10-CM

## 2018-04-21 DIAGNOSIS — Z125 Encounter for screening for malignant neoplasm of prostate: Secondary | ICD-10-CM

## 2018-04-21 DIAGNOSIS — R3913 Splitting of urinary stream: Secondary | ICD-10-CM

## 2018-04-21 LAB — URINALYSIS, COMPLETE
BILIRUBIN UA: NEGATIVE
Ketones, UA: NEGATIVE
LEUKOCYTES UA: NEGATIVE
Nitrite, UA: NEGATIVE
PH UA: 6.5 (ref 5.0–7.5)
Protein, UA: NEGATIVE
Specific Gravity, UA: 1.02 (ref 1.005–1.030)
Urobilinogen, Ur: 0.2 mg/dL (ref 0.2–1.0)

## 2018-04-21 LAB — MICROSCOPIC EXAMINATION
Bacteria, UA: NONE SEEN
EPITHELIAL CELLS (NON RENAL): NONE SEEN /HPF (ref 0–10)
WBC, UA: NONE SEEN /hpf (ref 0–5)

## 2018-04-21 NOTE — Progress Notes (Signed)
   04/21/2018  10:15 AM  CC:  Chief Complaint  Patient presents with  . Cysto    HPI: Brandon Benjamin is a 73 y.o. male that presents today for a cystoscopy. He has a history of BPH with obstruction and bladder stones.   Patient reports spilt stream/spraying occasionally with urination. He admits that it occurs most when he has increased urgency.  Review previous notes for history.  Blood pressure (!) 163/93, pulse 61, height 5\' 9"  (1.753 m), weight 211 lb (95.7 kg). NED. A&Ox3.   No respiratory distress   Abd soft, NT, ND Normal phallus with bilateral descended testicles  Cystoscopy Procedure Note  Patient identification was confirmed, informed consent was obtained, and patient was prepped using Betadine solution.  Lidocaine jelly was administered per urethral meatus.    Pre-Procedure: - Inspection reveals a normal caliber ureteral meatus.  Procedure: The flexible cystoscope was introduced without difficulty - No urethral strictures/lesions are present. - Coapting tissue within prostate at apex - Boulous edema on prostatic mucosa with wide open prostate fossa with small distrophic calcification that was dislodged cystoscopically. Bleeding with manipulation. - No intravesical component of prostate  - Wide open bladder neck - Bilateral ureteral orifices identified - Bladder mucosa reveals no ulcers, tumors, or lesions - No bladder stones - Minimal trabeculation  Retroflexion Unremarkable  Post-Procedure: - Patient tolerated the procedure well  Assessment/ Plan:  1. History of BPH with obstruction  - Overall pleased with his symptoms  2. Split Stream  - No evidence of stricture  3. Prostate Distrophic Calcification  - Dislodged today. I do not suspect that this is cause of his urinary symptoms.  4. PSA Screening  - Due for PSA today, however we completed cystoscopy first.   - Advised to return for PSA in 1 week.  Return in about 1 week (around 04/28/2018) for  PSA; follow up in 1 year for IPSS PVR PSA.  I, Temidayo Atanda-Ogunleye , am acting as a scribe for Hollice Espy, MD  I have reviewed the above documentation for accuracy and completeness, and I agree with the above.   Hollice Espy, MD

## 2018-04-30 ENCOUNTER — Ambulatory Visit (INDEPENDENT_AMBULATORY_CARE_PROVIDER_SITE_OTHER): Payer: Medicare Other | Admitting: Gastroenterology

## 2018-04-30 ENCOUNTER — Encounter: Payer: Self-pay | Admitting: Gastroenterology

## 2018-04-30 VITALS — BP 153/99 | HR 58 | Ht 69.0 in | Wt 215.0 lb

## 2018-04-30 DIAGNOSIS — D509 Iron deficiency anemia, unspecified: Secondary | ICD-10-CM

## 2018-04-30 NOTE — Progress Notes (Signed)
Brandon Bellows MD, MRCP(U.K) [address]  [place], [place] [address]  Main: [phone]  Fax: [phone]   Primary Care Physician: Letta Median, MD  Primary Gastroenterologist:  Dr. Jonathon Benjamin   No chief complaint on file.   HPI: Brandon Benjamin is a 73 y.o. male    Summary of history :  He was initially seen On 08/28/17 for microcytic anemia, melena for 1 month. No NSAID's. He has had a colonoscopy in [facility] 2 years back and was normal. Denies use of any blood thinners. He has been on iron for a month .  B12,folate normal   09/09/17- Colonoscopy : 6 tiny polypsx 4 adenomas were excised  EGD - 7 cm hiatal hernia- edematous appearing GE junction. 12/01/2017: EGD: Gastric polyp was seen. About 10 mm in size. Not resected as if it would have bled it would have affected the results of the capsule test. Biopsies were taken. Biopsies were done as of a large fundic gland polyp. Candidiasis of the esophagus also noted. 12/08/2017: Small bowel capsule: Normal study of the small bowel. No abnormalities noted. Iron studies have normalized from 01/12/18 RUQ USG 01/16/18 : simple hepatic cysts of the right hepatic lobe  01/13/18 : CMP,CBC-Normal    Interval history10/22/19-04/30/2018  02/09/2018 : EGD: large polyp resected in stomach. Fundic gland polyp   :Linzess working well for constipation. No abdominal pain doing well.     Current Outpatient Medications  Medication Sig Dispense Refill  . ACCU-CHEK AVIVA PLUS test strip     . ACCU-CHEK SOFTCLIX LANCETS lancets     . acetaminophen (TYLENOL 8 HOUR ARTHRITIS PAIN) 650 MG CR tablet Take 650 mg by mouth every 8 (eight) hours as needed for pain.    Marland Kitchen amLODipine (NORVASC) 10 MG tablet Take 10 mg by mouth daily.    . cetirizine (ZYRTEC) 10 MG tablet Take 10 mg by mouth daily.    . citalopram (CELEXA) 10 MG tablet Take 10 mg by mouth daily.    Marland Kitchen docusate sodium (COLACE) 100 MG capsule Take  100 mg by mouth 2 (two) times daily.    . ferrous sulfate 325 (65 FE) MG tablet Take 325 mg by mouth 3 (three) times daily with meals.    . fluticasone (FLONASE) 50 MCG/ACT nasal spray Place 2 sprays into both nostrils daily.     . hydrALAZINE (APRESOLINE) 25 MG tablet     . hydrochlorothiazide (HYDRODIURIL) 25 MG tablet Take 25 mg by mouth daily.     Marland Kitchen linaclotide (LINZESS) 290 MCG CAPS capsule Take 1 capsule (290 mcg total) by mouth daily before breakfast. 90 capsule 1  . lisinopril (PRINIVIL,ZESTRIL) 40 MG tablet Take 40 mg by mouth daily.    Marland Kitchen lovastatin (MEVACOR) 20 MG tablet Take 20 mg by mouth at bedtime.    . metFORMIN (GLUCOPHAGE) 500 MG tablet Take 500 mg by mouth 2 (two) times daily.     . metoprolol (LOPRESSOR) 100 MG tablet Take 100 mg by mouth 2 (two) times daily.     . Multiple Vitamin (MULTIVITAMIN WITH MINERALS) TABS tablet Take 1 tablet by mouth daily. Centrum Silver Multivitamin    . omeprazole (PRILOSEC) 40 MG capsule Take 1 capsule (40 mg total) by mouth daily. 90 capsule 3  . potassium chloride (K-DUR) 10 MEQ tablet Take 10 mEq by mouth daily.      No current facility-administered medications for this visit.     Allergies as of 04/30/2018  . (  No Known Allergies)    ROS:  General: Negative for anorexia, weight loss, fever, chills, fatigue, weakness. ENT: Negative for hoarseness, difficulty swallowing , nasal congestion. CV: Negative for chest pain, angina, palpitations, dyspnea on exertion, peripheral edema.  Respiratory: Negative for dyspnea at rest, dyspnea on exertion, cough, sputum, wheezing.  GI: See history of present illness. GU:  Negative for dysuria, hematuria, urinary incontinence, urinary frequency, nocturnal urination.  Endo: Negative for unusual weight change.    Physical Examination:   There were no vitals taken for this visit.  General: Well-nourished, well-developed in no acute distress.  Eyes: No icterus. Conjunctivae pink. Mouth:  Oropharyngeal mucosa moist and pink , no lesions erythema or exudate. Lungs: Clear to auscultation bilaterally. Non-labored. Heart: Regular rate and rhythm, no murmurs rubs or gallops.  Abdomen: Bowel sounds are normal, nontender, nondistended, no hepatosplenomegaly or masses, no abdominal bruits or hernia , no rebound or guarding.   Extremities: No lower extremity edema. No clubbing or deformities. Neuro: Alert and oriented x 3.  Grossly intact. Skin: Warm and dry, no jaundice.   Psych: Alert and cooperative, normal mood and affect.   Imaging Studies: No results found.  Assessment and Plan:   Brandon Benjamin is a 73 y.o. y/o male here to follow up for iron deficiency anemia. EGD showed a large hiatal hernia and edematous GE junction with bx showing no malignancy. Colonoscopy showed a few colon polyps that were excised.Normal capsule study of the small bowel . Likely cause of iron deficiency anemia is the large hiatal hernia that was seen. Doing well on IV iron. RUQ pain - resolved     Plan  1. Continue PPI, check iron studies   Dr Brandon Bellows  MD,MRCP [facility]) Follow up in 6 months

## 2018-05-01 ENCOUNTER — Other Ambulatory Visit: Payer: Medicare Other

## 2018-05-01 ENCOUNTER — Encounter: Payer: Self-pay | Admitting: Gastroenterology

## 2018-05-01 DIAGNOSIS — Z125 Encounter for screening for malignant neoplasm of prostate: Secondary | ICD-10-CM

## 2018-05-01 LAB — IRON,TIBC AND FERRITIN PANEL
Ferritin: 80 ng/mL (ref 30–400)
IRON SATURATION: 40 % (ref 15–55)
Iron: 133 ug/dL (ref 38–169)
TIBC: 330 ug/dL (ref 250–450)
UIBC: 197 ug/dL (ref 111–343)

## 2018-05-01 LAB — CBC WITH DIFFERENTIAL/PLATELET
BASOS ABS: 0.1 10*3/uL (ref 0.0–0.2)
Basos: 1 %
EOS (ABSOLUTE): 0.2 10*3/uL (ref 0.0–0.4)
Eos: 4 %
Hematocrit: 45.9 % (ref 37.5–51.0)
Hemoglobin: 16.4 g/dL (ref 13.0–17.7)
IMMATURE GRANS (ABS): 0 10*3/uL (ref 0.0–0.1)
Immature Granulocytes: 0 %
LYMPHS: 30 %
Lymphocytes Absolute: 1.7 10*3/uL (ref 0.7–3.1)
MCH: 31.4 pg (ref 26.6–33.0)
MCHC: 35.7 g/dL (ref 31.5–35.7)
MCV: 88 fL (ref 79–97)
Monocytes Absolute: 0.6 10*3/uL (ref 0.1–0.9)
Monocytes: 9 %
NEUTROS ABS: 3.3 10*3/uL (ref 1.4–7.0)
NEUTROS PCT: 56 %
PLATELETS: 196 10*3/uL (ref 150–450)
RBC: 5.22 x10E6/uL (ref 4.14–5.80)
RDW: 13.5 % (ref 11.6–15.4)
WBC: 5.9 10*3/uL (ref 3.4–10.8)

## 2018-05-02 LAB — PSA: PROSTATE SPECIFIC AG, SERUM: 1.9 ng/mL (ref 0.0–4.0)

## 2018-05-04 ENCOUNTER — Telehealth: Payer: Self-pay

## 2018-05-04 NOTE — Telephone Encounter (Signed)
-----   Message from Hollice Espy, MD sent at 05/04/2018  8:48 AM EST ----- PSA is 1.9 which is great.    Hollice Espy, MD

## 2018-05-04 NOTE — Telephone Encounter (Signed)
Called patient and advised of PSA result. Patient verbalized understanding.

## 2018-05-18 ENCOUNTER — Inpatient Hospital Stay: Payer: Medicare Other | Attending: Oncology

## 2018-05-18 DIAGNOSIS — E785 Hyperlipidemia, unspecified: Secondary | ICD-10-CM | POA: Insufficient documentation

## 2018-05-18 DIAGNOSIS — R3915 Urgency of urination: Secondary | ICD-10-CM | POA: Insufficient documentation

## 2018-05-18 DIAGNOSIS — K219 Gastro-esophageal reflux disease without esophagitis: Secondary | ICD-10-CM | POA: Diagnosis not present

## 2018-05-18 DIAGNOSIS — F419 Anxiety disorder, unspecified: Secondary | ICD-10-CM | POA: Diagnosis not present

## 2018-05-18 DIAGNOSIS — Z87442 Personal history of urinary calculi: Secondary | ICD-10-CM | POA: Diagnosis not present

## 2018-05-18 DIAGNOSIS — N401 Enlarged prostate with lower urinary tract symptoms: Secondary | ICD-10-CM | POA: Insufficient documentation

## 2018-05-18 DIAGNOSIS — K921 Melena: Secondary | ICD-10-CM | POA: Diagnosis not present

## 2018-05-18 DIAGNOSIS — K449 Diaphragmatic hernia without obstruction or gangrene: Secondary | ICD-10-CM | POA: Diagnosis not present

## 2018-05-18 DIAGNOSIS — E119 Type 2 diabetes mellitus without complications: Secondary | ICD-10-CM | POA: Diagnosis not present

## 2018-05-18 DIAGNOSIS — I10 Essential (primary) hypertension: Secondary | ICD-10-CM | POA: Diagnosis not present

## 2018-05-18 DIAGNOSIS — D500 Iron deficiency anemia secondary to blood loss (chronic): Secondary | ICD-10-CM | POA: Diagnosis present

## 2018-05-18 DIAGNOSIS — Z79899 Other long term (current) drug therapy: Secondary | ICD-10-CM | POA: Diagnosis not present

## 2018-05-18 LAB — CBC WITH DIFFERENTIAL/PLATELET
ABS IMMATURE GRANULOCYTES: 0.03 10*3/uL (ref 0.00–0.07)
BASOS PCT: 1 %
Basophils Absolute: 0.1 10*3/uL (ref 0.0–0.1)
Eosinophils Absolute: 0.2 10*3/uL (ref 0.0–0.5)
Eosinophils Relative: 3 %
HEMATOCRIT: 47.7 % (ref 39.0–52.0)
HEMOGLOBIN: 16 g/dL (ref 13.0–17.0)
IMMATURE GRANULOCYTES: 0 %
LYMPHS ABS: 2.2 10*3/uL (ref 0.7–4.0)
LYMPHS PCT: 29 %
MCH: 30.3 pg (ref 26.0–34.0)
MCHC: 33.5 g/dL (ref 30.0–36.0)
MCV: 90.3 fL (ref 80.0–100.0)
MONOS PCT: 8 %
Monocytes Absolute: 0.6 10*3/uL (ref 0.1–1.0)
NEUTROS ABS: 4.4 10*3/uL (ref 1.7–7.7)
NEUTROS PCT: 59 %
PLATELETS: 219 10*3/uL (ref 150–400)
RBC: 5.28 MIL/uL (ref 4.22–5.81)
RDW: 13.7 % (ref 11.5–15.5)
WBC: 7.5 10*3/uL (ref 4.0–10.5)
nRBC: 0 % (ref 0.0–0.2)

## 2018-05-18 LAB — IRON AND TIBC
Iron: 106 ug/dL (ref 45–182)
Saturation Ratios: 27 % (ref 17.9–39.5)
TIBC: 393 ug/dL (ref 250–450)
UIBC: 287 ug/dL

## 2018-05-18 LAB — FERRITIN: FERRITIN: 56 ng/mL (ref 24–336)

## 2018-05-19 ENCOUNTER — Encounter: Payer: Self-pay | Admitting: Oncology

## 2018-05-19 ENCOUNTER — Other Ambulatory Visit: Payer: Self-pay

## 2018-05-19 ENCOUNTER — Inpatient Hospital Stay (HOSPITAL_BASED_OUTPATIENT_CLINIC_OR_DEPARTMENT_OTHER): Payer: Medicare Other | Admitting: Oncology

## 2018-05-19 VITALS — BP 149/88 | HR 54 | Temp 96.5°F | Resp 18 | Wt 214.7 lb

## 2018-05-19 DIAGNOSIS — D500 Iron deficiency anemia secondary to blood loss (chronic): Secondary | ICD-10-CM | POA: Diagnosis not present

## 2018-05-19 DIAGNOSIS — Z79899 Other long term (current) drug therapy: Secondary | ICD-10-CM

## 2018-05-19 DIAGNOSIS — I10 Essential (primary) hypertension: Secondary | ICD-10-CM | POA: Diagnosis not present

## 2018-05-19 DIAGNOSIS — K449 Diaphragmatic hernia without obstruction or gangrene: Secondary | ICD-10-CM

## 2018-05-19 DIAGNOSIS — R3915 Urgency of urination: Secondary | ICD-10-CM

## 2018-05-19 DIAGNOSIS — E785 Hyperlipidemia, unspecified: Secondary | ICD-10-CM

## 2018-05-19 DIAGNOSIS — K921 Melena: Secondary | ICD-10-CM | POA: Diagnosis not present

## 2018-05-19 DIAGNOSIS — N401 Enlarged prostate with lower urinary tract symptoms: Secondary | ICD-10-CM

## 2018-05-19 DIAGNOSIS — Z87442 Personal history of urinary calculi: Secondary | ICD-10-CM

## 2018-05-19 DIAGNOSIS — F419 Anxiety disorder, unspecified: Secondary | ICD-10-CM

## 2018-05-19 DIAGNOSIS — E119 Type 2 diabetes mellitus without complications: Secondary | ICD-10-CM

## 2018-05-19 DIAGNOSIS — K219 Gastro-esophageal reflux disease without esophagitis: Secondary | ICD-10-CM

## 2018-05-19 NOTE — Progress Notes (Signed)
Hematology/Oncology follow up  [facility] [phone]) [phone] [phone]   Patient Care Team: Letta Median, MD as PCP - General Rebeca Alert, Durene Cal, MD as Referring Physician (Family Medicine) Christene Lye, MD (General Surgery)  REFERRING PROVIDER: Dr.Anna REASON FOR VISIT Follow up for treatment of  Iron deficiency anemia  HISTORY OF PRESENTING ILLNESS:  Brandon Benjamin is a  73 y.o.  male with PMH listed below who was referred to me for evaluation of microcytic anemia.  Recent labs suggest microcytic anemia with hemoglobin 8.1 and MCV of 68.  Reviewed patient's previous Lab work in the past reviewed showed a chronic history of anemia with baseline hemoglobin around 10. Patient reports having black stools, intermittently, duration for about past month.  Not associated with abdominal pain, diarrhea, constipation, change of bowel habits.  Patient does not use any NSAIDs, denies using any anticoagulation..  Last colonoscopy was about 2 years ago at [facility] and per patient was normal. She started back on oral iron supplementation for about a month.  Repeat CBC recently showed corrected hemoglobin of 13.  Denies any history of PRBC transfusion during the interval.    # 09/09/17- Colonoscopy : 6 tiny polypsx 4 adenomas  were excised  EGD - 2 esophageal ulcers with no bleedings. 7 cm hiatal hernia- edematous appearing GE junction. 12/01/2017 repeat endoscopy He has gastric polyps, biopsy was taken. Pathology showed fundic gland polyp. Dr.Anna recommend EGD resection in 2-3 months.  Candidiasis of the esophagus was also noted.  12/08/2017 capsule study showed no abnormality.      INTERVAL HISTORY Brandon Benjamin is a 73 y.o. male who has above history reviewed by me today presents for follow up visit for management of iron deficiency anemia.  During the interval, patient has upper endoscopy done on 02/09/2018 Endoscopy showed a single  gastric polyp resected and retrieved.  Large hiatal hernia.  Normal esophagus.  Normal examined duodenum. Pathology showed fundic gland polyp.  Negative for H. pylori, dysplasia, and malignancy. Patient has had IV iron lesions in the past. Today he feels very well.  No new complaints.  Fatigue has resolved. Denies any epigastric pain, chest pain, heartburn, abdominal pain, black tarry stool, blood in the stool.    Review of Systems  Constitutional: Negative for chills, fever, malaise/fatigue and weight loss.  HENT: Negative for congestion, ear discharge, ear pain, nosebleeds, sinus pain and sore throat.   Eyes: Negative for double vision, photophobia, pain and redness.  Respiratory: Negative for cough, hemoptysis, shortness of breath and wheezing.   Cardiovascular: Negative for chest pain, palpitations, orthopnea and leg swelling.  Gastrointestinal: Negative for abdominal pain, blood in stool, constipation, diarrhea, heartburn, melena, nausea and vomiting.  Genitourinary: Negative for dysuria, flank pain, frequency and hematuria.  Musculoskeletal: Negative for back pain, myalgias and neck pain.  Skin: Negative for itching and rash.  Neurological: Negative for dizziness, tingling, tremors, focal weakness, weakness and headaches.  Endo/Heme/Allergies: Negative for environmental allergies. Does not bruise/bleed easily.  Psychiatric/Behavioral: Negative for depression and hallucinations. The patient is not nervous/anxious.     MEDICAL HISTORY:  Past Medical History:  Diagnosis Date  . Anemia   . Anxiety   . Balanitis   . Balanitis   . BPH (benign prostatic hyperplasia)   . Diabetes ([place])   . Erectile dysfunction   . GERD (gastroesophageal reflux disease)   . History of kidney stones   . HLD (hyperlipidemia)   . HTN (hypertension)   .  Iron deficiency anemia due to chronic blood loss 09/04/2017  . Over weight   . Priapism   . Scrotal cyst   . Urinary urgency     SURGICAL  HISTORY: Past Surgical History:  Procedure Laterality Date  . CHOLECYSTECTOMY N/A 12/25/2016   Procedure: LAPAROSCOPIC CHOLECYSTECTOMY WITH INTRAOPERATIVE CHOLANGIOGRAM;  Surgeon: Jules Husbands, MD;  Location: [facility] ORS;  Service: General;  Laterality: N/A;  . COLONOSCOPY WITH PROPOFOL N/A 09/09/2017   Procedure: COLONOSCOPY WITH PROPOFOL;  Surgeon: [name], MD;  Location: [facility] ENDOSCOPY;  Service: Gastroenterology;  Laterality: N/A;  . CYSTOSCOPY WITH LITHOLAPAXY N/A 07/16/2017   Procedure: CYSTOSCOPY WITH LITHOLAPAXY;  Surgeon: Hollice Espy, MD;  Location: [facility] ORS;  Service: Urology;  Laterality: N/A;  . ERCP N/A 11/28/2016   Procedure: ENDOSCOPIC RETROGRADE CHOLANGIOPANCREATOGRAPHY (ERCP);  Surgeon: [name], MD;  Location: [facility] ENDOSCOPY;  Service: Endoscopy;  Laterality: N/A;  . ESOPHAGOGASTRODUODENOSCOPY (EGD) WITH PROPOFOL N/A 09/09/2017   Procedure: ESOPHAGOGASTRODUODENOSCOPY (EGD) WITH PROPOFOL;  Surgeon: [name], MD;  Location: [facility] ENDOSCOPY;  Service: Gastroenterology;  Laterality: N/A;  . ESOPHAGOGASTRODUODENOSCOPY (EGD) WITH PROPOFOL N/A 12/01/2017   Procedure: ESOPHAGOGASTRODUODENOSCOPY (EGD) WITH PROPOFOL;  Surgeon: [name], MD;  Location: [facility] ENDOSCOPY;  Service: Gastroenterology;  Laterality: N/A;  . ESOPHAGOGASTRODUODENOSCOPY (EGD) WITH PROPOFOL N/A 02/09/2018   Procedure: ESOPHAGOGASTRODUODENOSCOPY (EGD) WITH PROPOFOL;  Surgeon: [name], MD;  Location: [facility] ENDOSCOPY;  Service: Gastroenterology;  Laterality: N/A;  . GIVENS CAPSULE STUDY N/A 12/01/2017   Procedure: GIVENS CAPSULE STUDY;  Surgeon: [name], MD;  Location: [facility] ENDOSCOPY;  Service: Gastroenterology;  Laterality: N/A;  . HOLEP-LASER ENUCLEATION OF THE PROSTATE WITH MORCELLATION N/A 07/11/2015   Procedure: HOLEP-LASER ENUCLEATION OF THE PROSTATE WITH MORCELLATION;  Surgeon: Hollice Espy, MD;  Location: [facility] ORS;  Service: Urology;  Laterality: N/A;  . TRANSURETHRAL RESECTION OF PROSTATE N/A  07/16/2017   Procedure: TRANSURETHRAL RESECTION OF THE PROSTATE (TURP);  Surgeon: Hollice Espy, MD;  Location: [facility] ORS;  Service: Urology;  Laterality: N/A;  . UMBILICAL HERNIA REPAIR  12/25/2016   Procedure: HERNIA REPAIR UMBILICAL ADULT;  Surgeon: Jules Husbands, MD;  Location: [facility] ORS;  Service: General;;    SOCIAL HISTORY: Social History   Socioeconomic History  . Marital status: Married    Spouse name: Not on file  . Number of children: Not on file  . Years of education: Not on file  . Highest education level: Not on file  Occupational History  . Not on file  Social Needs  . Financial resource strain: Not on file  . Food insecurity:    Worry: Not on file    Inability: Not on file  . Transportation needs:    Medical: Not on file    Non-medical: Not on file  Tobacco Use  . Smoking status: Former Smoker    Types: Cigarettes    Last attempt to quit: 03/26/1995    Years since quitting: 23.1  . Smokeless tobacco: Never Used  Substance and Sexual Activity  . Alcohol use: No  . Drug use: No  . Sexual activity: Not on file  Lifestyle  . Physical activity:    Days per week: Not on file    Minutes per session: Not on file  . Stress: Not on file  Relationships  . Social connections:    Talks on phone: Not on file    Gets together: Not on file    Attends religious service: Not on file    Active member of club or organization: Not on  file    Attends meetings of clubs or organizations: Not on file    Relationship status: Not on file  . Intimate partner violence:    Fear of current or ex partner: Not on file    Emotionally abused: Not on file    Physically abused: Not on file    Forced sexual activity: Not on file  Other Topics Concern  . Not on file  Social History Narrative  . Not on file    FAMILY HISTORY: Family History  Problem Relation Age of Onset  . Cancer Mother        blood stream  . High blood pressure Mother   . Hypertension Sister   . Anemia Sister    . Prostate cancer Neg Hx   . Bladder Cancer Neg Hx   . Kidney disease Neg Hx     ALLERGIES:  has No Known Allergies.  MEDICATIONS:  Current Outpatient Medications  Medication Sig Dispense Refill  . ACCU-CHEK AVIVA PLUS test strip     . ACCU-CHEK SOFTCLIX LANCETS lancets     . acetaminophen (TYLENOL 8 HOUR ARTHRITIS PAIN) 650 MG CR tablet Take 650 mg by mouth every 8 (eight) hours as needed for pain.    Marland Kitchen amLODipine (NORVASC) 10 MG tablet Take 10 mg by mouth daily.    . cetirizine (ZYRTEC) 10 MG tablet Take 10 mg by mouth daily.    . citalopram (CELEXA) 10 MG tablet Take 10 mg by mouth daily.    Marland Kitchen docusate sodium (COLACE) 100 MG capsule Take 100 mg by mouth 2 (two) times daily.    . fluticasone (FLONASE) 50 MCG/ACT nasal spray Place 2 sprays into both nostrils daily.     . hydrALAZINE (APRESOLINE) 25 MG tablet     . hydrochlorothiazide (HYDRODIURIL) 25 MG tablet Take 25 mg by mouth daily.     Marland Kitchen LINZESS 290 MCG CAPS capsule Take 290 mcg by mouth daily.     Marland Kitchen lisinopril (PRINIVIL,ZESTRIL) 40 MG tablet Take 40 mg by mouth daily.    Marland Kitchen lovastatin (MEVACOR) 20 MG tablet Take 20 mg by mouth at bedtime.    . metFORMIN (GLUCOPHAGE) 500 MG tablet Take 500 mg by mouth 2 (two) times daily.     . metoprolol (LOPRESSOR) 100 MG tablet Take 100 mg by mouth 2 (two) times daily.     . Multiple Vitamin (MULTIVITAMIN WITH MINERALS) TABS tablet Take 1 tablet by mouth daily. Centrum Silver Multivitamin    . omeprazole (PRILOSEC) 40 MG capsule Take 1 capsule (40 mg total) by mouth daily. 90 capsule 3  . potassium chloride (K-DUR) 10 MEQ tablet Take 10 mEq by mouth daily.     . ferrous sulfate 325 (65 FE) MG tablet Take 325 mg by mouth 3 (three) times daily with meals.    Marland Kitchen linaclotide (LINZESS) 290 MCG CAPS capsule Take 1 capsule (290 mcg total) by mouth daily before breakfast. 90 capsule 1   No current facility-administered medications for this visit.      PHYSICAL EXAMINATION: ECOG PERFORMANCE  STATUS: 1 - Symptomatic but completely ambulatory Vitals:   05/19/18 1005  BP: (!) 149/88  Pulse: (!) 54  Resp: 18  Temp: (!) 96.5 F (35.8 C)   Filed Weights   05/19/18 1005  Weight: 214 lb 11.2 oz (97.4 kg)    Physical Exam Constitutional:      General: He is not in acute distress.    Comments: obese  HENT:     Head:  Normocephalic and atraumatic.  Eyes:     General: No scleral icterus.    Conjunctiva/sclera: Conjunctivae normal.     Pupils: Pupils are equal, round, and reactive to light.  Neck:     Musculoskeletal: Normal range of motion and neck supple.  Cardiovascular:     Rate and Rhythm: Normal rate and regular rhythm.     Heart sounds: Normal heart sounds.  Pulmonary:     Effort: Pulmonary effort is normal. No respiratory distress.     Breath sounds: Normal breath sounds. No wheezing or rales.  Chest:     Chest wall: No tenderness.  Abdominal:     General: Bowel sounds are normal. There is no distension.     Palpations: Abdomen is soft. There is no mass.     Tenderness: There is no abdominal tenderness.  Musculoskeletal: Normal range of motion.        General: No deformity.  Lymphadenopathy:     Cervical: No cervical adenopathy.  Skin:    General: Skin is warm and dry.     Findings: No erythema or rash.  Neurological:     Mental Status: He is alert and oriented to person, place, and time.     Cranial Nerves: No cranial nerve deficit.     Coordination: Coordination normal.  Psychiatric:        Behavior: Behavior normal.        Thought Content: Thought content normal.      LABORATORY DATA:  I have reviewed the data as listed Lab Results  Component Value Date   WBC 7.5 05/18/2018   HGB 16.0 05/18/2018   HCT 47.7 05/18/2018   MCV 90.3 05/18/2018   PLT 219 05/18/2018   Recent Labs    07/03/17 1524 07/16/17 1204 07/17/17 0455 01/13/18 1401  NA 139 139 137 145*  K 3.1* 3.5 3.5 3.9  CL 105  --  104 104  CO2 29  --  27 27  GLUCOSE 83 115* 137*  99  BUN 23*  --  17 15  CREATININE 1.12  --  1.01 1.30*  CALCIUM 9.9  --  9.0 10.6*  GFRNONAA >60  --  >60 55*  GFRAA >60  --  >60 64  PROT  --   --   --  6.6  ALBUMIN  --   --   --  4.4  AST  --   --   --  17  ALT  --   --   --  23  ALKPHOS  --   --   --  68  BILITOT  --   --   --  0.4   Iron/TIBC/Ferritin/ %Sat    Component Value Date/Time   IRON 106 05/18/2018 1413   IRON 133 04/30/2018 1305   TIBC 393 05/18/2018 1413   TIBC 330 04/30/2018 1305   FERRITIN 56 05/18/2018 1413   FERRITIN 80 04/30/2018 1305   IRONPCTSAT 27 05/18/2018 1413   IRONPCTSAT 40 04/30/2018 1305        ASSESSMENT & PLAN:  1. Iron deficiency anemia due to chronic blood loss   2. Hiatal hernia    S/p Venofer 200mg  weekly x 4 the past. Labs are reviewed and discussed with patient.  Hemoglobin has completely resolved.  Iron panel indicates adequate iron stores.  Hold additional IV iron infusions.  Hiatal hernia, likely the source of GI bleeding.  Continue Prilosec 40 mg daily.  Follow-up with gastroenterology. Discussed with patient that for now  she does not need to follow-up with me anymore. I recommend patient to continue follow-up with and have CBC, iron, ferritin, TIBC checked periodically.  If needed in future, I am happy to see him again for IV iron infusions or any other evaluation if indicated.  We spent sufficient time to discuss many aspect of care, questions were answered to patient's satisfaction. Total face to face encounter time for this patient visit was 79min. >50% of the time was  spent in counseling and coordination of care.    Earlie Server, MD, PhD Hematology Oncology [facility] Pager- [phone] 05/19/2018

## 2018-05-19 NOTE — Progress Notes (Signed)
Patient here for follow up. States he is "feeling good" today. No concerns.

## 2018-05-21 ENCOUNTER — Ambulatory Visit: Payer: Medicare Other | Admitting: Podiatry

## 2018-05-28 ENCOUNTER — Encounter: Payer: Self-pay | Admitting: Podiatry

## 2018-05-28 ENCOUNTER — Ambulatory Visit (INDEPENDENT_AMBULATORY_CARE_PROVIDER_SITE_OTHER): Payer: Medicare Other | Admitting: Podiatry

## 2018-05-28 DIAGNOSIS — E119 Type 2 diabetes mellitus without complications: Secondary | ICD-10-CM | POA: Diagnosis not present

## 2018-05-28 DIAGNOSIS — M79676 Pain in unspecified toe(s): Secondary | ICD-10-CM | POA: Diagnosis not present

## 2018-05-28 DIAGNOSIS — B351 Tinea unguium: Secondary | ICD-10-CM | POA: Diagnosis not present

## 2018-05-28 NOTE — Progress Notes (Signed)
Complaint:  Visit Type: Patient returns to my office for continued preventative foot care services. Complaint: Patient states" my nails have grown long and thick and become painful to walk and wear shoes" Patient has been diagnosed with DM with no foot complications. The patient presents for preventative foot care services.   Podiatric Exam: Vascular: dorsalis pedis and posterior tibial pulses are palpable bilateral. Capillary return is immediate. Temperature gradient is WNL. Skin turgor WNL  Sensorium: Normal Semmes Weinstein monofilament test. Normal tactile sensation bilaterally. Nail Exam: Pt has thick disfigured discolored nails with subungual debris noted bilateral entire nail hallux through fifth toenails Ulcer Exam: There is no evidence of ulcer or pre-ulcerative changes or infection. Orthopedic Exam: Muscle tone and strength are WNL. No limitations in general ROM. No crepitus or effusions noted. Foot type and digits show no abnormalities. Bony prominences are unremarkable. Skin: No Porokeratosis. No infection or ulcers  Diagnosis:  Onychomycosis, , Pain in right toe, pain in left toes  Treatment & Plan Procedures and Treatment: Consent by patient was obtained for treatment procedures. The patient understood the discussion of treatment and procedures well. All questions were answered thoroughly reviewed. Debridement of mycotic and hypertrophic toenails, 1 through 5 bilateral and clearing of subungual debris. No ulceration, no infection noted.  Return Visit-Office Procedure: Patient instructed to return to the office for a follow up visit 3 months for continued evaluation and treatment.    Danuta Huseman DPM 

## 2018-08-27 ENCOUNTER — Other Ambulatory Visit: Payer: Self-pay

## 2018-08-27 ENCOUNTER — Encounter: Payer: Self-pay | Admitting: Podiatry

## 2018-08-27 ENCOUNTER — Ambulatory Visit (INDEPENDENT_AMBULATORY_CARE_PROVIDER_SITE_OTHER): Payer: Medicare Other | Admitting: Podiatry

## 2018-08-27 VITALS — Temp 98.1°F

## 2018-08-27 DIAGNOSIS — M79676 Pain in unspecified toe(s): Secondary | ICD-10-CM

## 2018-08-27 DIAGNOSIS — E119 Type 2 diabetes mellitus without complications: Secondary | ICD-10-CM

## 2018-08-27 DIAGNOSIS — B351 Tinea unguium: Secondary | ICD-10-CM

## 2018-08-27 NOTE — Progress Notes (Signed)
Complaint:  Visit Type: Patient returns to my office for continued preventative foot care services. Complaint: Patient states" my nails have grown long and thick and become painful to walk and wear shoes" Patient has been diagnosed with DM with no foot complications. The patient presents for preventative foot care services.   Podiatric Exam: Vascular: dorsalis pedis and posterior tibial pulses are palpable bilateral. Capillary return is immediate. Temperature gradient is WNL. Skin turgor WNL  Sensorium: Normal Semmes Weinstein monofilament test. Normal tactile sensation bilaterally. Nail Exam: Pt has thick disfigured discolored nails with subungual debris noted bilateral entire nail hallux through fifth toenails Ulcer Exam: There is no evidence of ulcer or pre-ulcerative changes or infection. Orthopedic Exam: Muscle tone and strength are WNL. No limitations in general ROM. No crepitus or effusions noted. Foot type and digits show no abnormalities. Bony prominences are unremarkable. Skin: No Porokeratosis. No infection or ulcers  Diagnosis:  Onychomycosis, , Pain in right toe, pain in left toes  Treatment & Plan Procedures and Treatment: Consent by patient was obtained for treatment procedures. The patient understood the discussion of treatment and procedures well. All questions were answered thoroughly reviewed. Debridement of mycotic and hypertrophic toenails, 1 through 5 bilateral and clearing of subungual debris. No ulceration, no infection noted.  Return Visit-Office Procedure: Patient instructed to return to the office for a follow up visit 3 months for continued evaluation and treatment.    Gardiner Barefoot DPM

## 2018-10-28 ENCOUNTER — Encounter: Payer: Self-pay | Admitting: Physician Assistant

## 2018-10-28 ENCOUNTER — Ambulatory Visit (INDEPENDENT_AMBULATORY_CARE_PROVIDER_SITE_OTHER): Payer: Medicare Other | Admitting: Physician Assistant

## 2018-10-28 ENCOUNTER — Other Ambulatory Visit: Payer: Self-pay

## 2018-10-28 VITALS — BP 162/95 | HR 64 | Ht 68.0 in | Wt 213.0 lb

## 2018-10-28 DIAGNOSIS — N138 Other obstructive and reflux uropathy: Secondary | ICD-10-CM | POA: Diagnosis not present

## 2018-10-28 DIAGNOSIS — N401 Enlarged prostate with lower urinary tract symptoms: Secondary | ICD-10-CM

## 2018-10-28 DIAGNOSIS — R300 Dysuria: Secondary | ICD-10-CM | POA: Diagnosis not present

## 2018-10-28 LAB — URINALYSIS, COMPLETE
Bilirubin, UA: NEGATIVE
Glucose, UA: NEGATIVE
Nitrite, UA: NEGATIVE
RBC, UA: NEGATIVE
Specific Gravity, UA: >1.03 — ABNORMAL HIGH (ref 1.005–1.030)
Urobilinogen, Ur: 0.2 mg/dL (ref 0.2–1.0)
pH, UA: 5.5 (ref 5.0–7.5)

## 2018-10-28 LAB — MICROSCOPIC EXAMINATION
RBC: NONE SEEN /hpf (ref 0–2)
WBC, UA: >30 /hpf — AB (ref 0–5)

## 2018-10-28 LAB — BLADDER SCAN AMB NON-IMAGING

## 2018-10-28 NOTE — Progress Notes (Signed)
10/28/2018 2:16 PM   Brandon Benjamin Nov 12, 1945 [id number]  CC: Dysuria, bilateral flank pain  HPI: Brandon Benjamin is a 73 y.o. male who presents today for evaluation of possible UTI. He is an established [facility] patient who previously underwent HOLEP with Dr. Erlene Quan on 07/11/2015. He later underwent TURP with cystolitholapaxy for 1cm bladder stone on 07/16/2017. He last saw Dr. Erlene Quan on 04/21/2018 for cysto; no strictures or obstructing bladder stones seen at that time.   Today, he reports a 83-month history of dysuria, difficulty initiating urinary stream, and split stream. He also reports some intermittent bilateral flank pain. He denies nausea, vomiting, fever, chills, gross hematuria, the sensation of incomplete bladder emptying, and trauma to his back or spine. He does have some dysuria at baseline. Upon questioning, he thinks it is possible that his flank pain is associated with completing household chores.  He does have a history of UTI, most recently 05/08/2017 and treated with nitrofurantoin. He does not have a history of nephrolithiasis.  In-office UA today positive for 1+ protein and 1+ leukocyte esterase; urine microscopy with >30 WBCs/HPF. PVR 72mL.  PMH: Past Medical History:  Diagnosis Date  . Anemia   . Anxiety   . Balanitis   . Balanitis   . BPH (benign prostatic hyperplasia)   . Diabetes ([place])   . Erectile dysfunction   . GERD (gastroesophageal reflux disease)   . History of kidney stones   . HLD (hyperlipidemia)   . HTN (hypertension)   . Iron deficiency anemia due to chronic blood loss 09/04/2017  . Over weight   . Priapism   . Scrotal cyst   . Urinary urgency     Surgical History: Past Surgical History:  Procedure Laterality Date  . CHOLECYSTECTOMY N/A 12/25/2016   Procedure: LAPAROSCOPIC CHOLECYSTECTOMY WITH INTRAOPERATIVE CHOLANGIOGRAM;  Surgeon: Jules Husbands, MD;  Location: [facility] ORS;  Service: General;  Laterality: N/A;  . COLONOSCOPY WITH PROPOFOL N/A  09/09/2017   Procedure: COLONOSCOPY WITH PROPOFOL;  Surgeon: [name], MD;  Location: [facility] ENDOSCOPY;  Service: Gastroenterology;  Laterality: N/A;  . CYSTOSCOPY WITH LITHOLAPAXY N/A 07/16/2017   Procedure: CYSTOSCOPY WITH LITHOLAPAXY;  Surgeon: Hollice Espy, MD;  Location: [facility] ORS;  Service: Urology;  Laterality: N/A;  . ERCP N/A 11/28/2016   Procedure: ENDOSCOPIC RETROGRADE CHOLANGIOPANCREATOGRAPHY (ERCP);  Surgeon: [name], MD;  Location: [facility] ENDOSCOPY;  Service: Endoscopy;  Laterality: N/A;  . ESOPHAGOGASTRODUODENOSCOPY (EGD) WITH PROPOFOL N/A 09/09/2017   Procedure: ESOPHAGOGASTRODUODENOSCOPY (EGD) WITH PROPOFOL;  Surgeon: [name], MD;  Location: [facility] ENDOSCOPY;  Service: Gastroenterology;  Laterality: N/A;  . ESOPHAGOGASTRODUODENOSCOPY (EGD) WITH PROPOFOL N/A 12/01/2017   Procedure: ESOPHAGOGASTRODUODENOSCOPY (EGD) WITH PROPOFOL;  Surgeon: [name], MD;  Location: [facility] ENDOSCOPY;  Service: Gastroenterology;  Laterality: N/A;  . ESOPHAGOGASTRODUODENOSCOPY (EGD) WITH PROPOFOL N/A 02/09/2018   Procedure: ESOPHAGOGASTRODUODENOSCOPY (EGD) WITH PROPOFOL;  Surgeon: [name], MD;  Location: [facility] ENDOSCOPY;  Service: Gastroenterology;  Laterality: N/A;  . GIVENS CAPSULE STUDY N/A 12/01/2017   Procedure: GIVENS CAPSULE STUDY;  Surgeon: [name], MD;  Location: [facility] ENDOSCOPY;  Service: Gastroenterology;  Laterality: N/A;  . HOLEP-LASER ENUCLEATION OF THE PROSTATE WITH MORCELLATION N/A 07/11/2015   Procedure: HOLEP-LASER ENUCLEATION OF THE PROSTATE WITH MORCELLATION;  Surgeon: Hollice Espy, MD;  Location: [facility] ORS;  Service: Urology;  Laterality: N/A;  . TRANSURETHRAL RESECTION OF PROSTATE N/A 07/16/2017   Procedure: TRANSURETHRAL RESECTION OF THE PROSTATE (TURP);  Surgeon: Hollice Espy, MD;  Location: [facility] ORS;  Service: Urology;  Laterality: N/A;  .  UMBILICAL HERNIA REPAIR  12/25/2016   Procedure: HERNIA REPAIR UMBILICAL ADULT;  Surgeon: Jules Husbands, MD;  Location: [facility] ORS;   Service: General;;    Home Medications:  Allergies as of 10/28/2018   No Known Allergies     Medication List       Accurate as of October 28, 2018  2:16 PM. If you have any questions, ask your nurse or doctor.        Accu-Chek Aviva Plus test strip Generic drug: glucose blood   Accu-Chek Softclix Lancets lancets   amLODipine 10 MG tablet Commonly known as: NORVASC Take 10 mg by mouth daily.   cetirizine 10 MG tablet Commonly known as: ZYRTEC Take 10 mg by mouth daily.   citalopram 10 MG tablet Commonly known as: CELEXA Take 10 mg by mouth daily.   dicyclomine 10 MG capsule Commonly known as: BENTYL   docusate sodium 100 MG capsule Commonly known as: COLACE Take 100 mg by mouth 2 (two) times daily.   fluticasone 50 MCG/ACT nasal spray Commonly known as: FLONASE Place 2 sprays into both nostrils daily.   hydrALAZINE 25 MG tablet Commonly known as: APRESOLINE   hydrochlorothiazide 25 MG tablet Commonly known as: HYDRODIURIL Take 25 mg by mouth daily.   linaclotide 290 MCG Caps capsule Commonly known as: Linzess Take 1 capsule (290 mcg total) by mouth daily before breakfast.   Linzess 290 MCG Caps capsule Generic drug: linaclotide Take 290 mcg by mouth daily.   lisinopril 40 MG tablet Commonly known as: ZESTRIL Take 40 mg by mouth daily.   lovastatin 20 MG tablet Commonly known as: MEVACOR Take 20 mg by mouth at bedtime.   metFORMIN 500 MG tablet Commonly known as: GLUCOPHAGE Take 500 mg by mouth 2 (two) times daily. What changed: Another medication with the same name was removed. Continue taking this medication, and follow the directions you see here. Changed by: Debroah Loop, PA-C   metoprolol tartrate 100 MG tablet Commonly known as: LOPRESSOR Take 100 mg by mouth 2 (two) times daily.   Multi-Vitamin tablet Take by mouth.   multivitamin with minerals Tabs tablet Take 1 tablet by mouth daily. Centrum Silver Multivitamin   nystatin  cream Commonly known as: MYCOSTATIN   omeprazole 40 MG capsule Commonly known as: PRILOSEC Take 1 capsule (40 mg total) by mouth daily.   potassium chloride 10 MEQ tablet Commonly known as: K-DUR Take 10 mEq by mouth daily.   tamsulosin 0.4 MG Caps capsule Commonly known as: FLOMAX   Tylenol 8 Hour Arthritis Pain 650 MG CR tablet Generic drug: acetaminophen Take 650 mg by mouth every 8 (eight) hours as needed for pain.      Allergies:  No Known Allergies  Family History: Family History  Problem Relation Age of Onset  . Cancer Mother        blood stream  . High blood pressure Mother   . Hypertension Sister   . Anemia Sister   . Prostate cancer Neg Hx   . Bladder Cancer Neg Hx   . Kidney disease Neg Hx    Social History:   reports that he quit smoking about 23 years ago. His smoking use included cigarettes. He has never used smokeless tobacco. He reports that he does not drink alcohol or use drugs.  ROS: UROLOGY Frequent Urination?: No Hard to postpone urination?: Yes Burning/pain with urination?: Yes Get up at night to urinate?: No Leakage of urine?: Yes Urine stream starts and stops?: Yes Trouble  starting stream?: Yes Do you have to strain to urinate?: Yes Blood in urine?: No Urinary tract infection?: No Sexually transmitted disease?: No Injury to kidneys or bladder?: No Painful intercourse?: No Weak stream?: Yes Erection problems?: No Penile pain?: No  Gastrointestinal Nausea?: No Vomiting?: No Indigestion/heartburn?: No Diarrhea?: No Constipation?: No  Constitutional Fever: No Night sweats?: No Weight loss?: No Fatigue?: No  Skin Skin rash/lesions?: No Itching?: No  Eyes Blurred vision?: No Double vision?: No  Ears/Nose/Throat Sore throat?: No Sinus problems?: No  Hematologic/Lymphatic Swollen glands?: No Easy bruising?: No  Cardiovascular Leg swelling?: No Chest pain?: No  Respiratory Cough?: No Shortness of breath?: No   Endocrine Excessive thirst?: No  Musculoskeletal Back pain?: Yes Joint pain?: No  Neurological Headaches?: No Dizziness?: No  Psychologic Depression?: No Anxiety?: No  Physical Exam: BP (!) 162/95 (BP Location: Left Arm, Patient Position: Sitting)   Pulse 64   Ht 5\' 8"  (1.727 m)   Wt 213 lb (96.6 kg)   BMI 32.39 kg/m   Constitutional:  Alert and oriented, no acute distress, nontoxic appearing HEENT: [place], AT Cardiovascular: No clubbing, cyanosis, or edema Respiratory: Normal respiratory effort, no increased work of breathing GU: No CVA tenderness Skin: No rashes, bruises or suspicious lesions Neurologic: Grossly intact, no focal deficits, moving all 4 extremities Psychiatric: Normal mood and affect  Laboratory Data: Urinalysis    Component Value Date/Time   COLORURINE YELLOW (A) 01/14/2018 1455   APPEARANCEUR Hazy (A) 10/28/2018 1315   LABSPEC 1.019 01/14/2018 1455   PHURINE 6.0 01/14/2018 1455   GLUCOSEU Negative 10/28/2018 1315   HGBUR NEGATIVE 01/14/2018 1455   BILIRUBINUR Negative 10/28/2018 1315   KETONESUR NEGATIVE 01/14/2018 1455   PROTEINUR 1+ (A) 10/28/2018 1315   PROTEINUR NEGATIVE 01/14/2018 1455   NITRITE Negative 10/28/2018 1315   NITRITE NEGATIVE 01/14/2018 1455   LEUKOCYTESUR 1+ (A) 10/28/2018 1315   Results for orders placed or performed in visit on 10/28/18  Microscopic Examination     Status: Abnormal   Collection Time: 10/28/18  1:15 PM   URINE  Result Value Ref Range Status   WBC, UA >30 (A) 0 - 5 /hpf Final   RBC None seen 0 - 2 /hpf Final   Epithelial Cells (non renal) 0-10 0 - 10 /hpf Final   Casts Present (A) None seen /lpf Final   Cast Type Hyaline casts N/A Final   Mucus, UA Present (A) Not Estab. Final   Bacteria, UA Few None seen/Few Final   Pertinent Imaging: Results for orders placed or performed in visit on 10/28/18  BLADDER SCAN AMB NON-IMAGING  Result Value Ref Range   Scan Result 84ml    Assessment & Plan:   1.  Difficulty initiating and split stream s/p HOLEP & TURP, PMH bladder stones Of his described urinary symptoms at today's visit, patient's difficulty initiating stream is most concerning for possible urinary tract abnormalities. Will order follow-up cystoscopy today to rule out structure or recurrent bladder stones.   I explained to him that split urinary stream is to be expected following his prostate resections and that this Buening never return to baseline. He expressed an understanding of this. -Cystoscopy  2. Dysuria Patient with some dysuria at baseline. UA unconcerning for infection today, will send for culture. The stability of his reported symptoms over the past three months make my clinical suspicion for infection low at this time. Given that Uribel has not alleviated his symptoms in the past, I will defer re-prescribing this today.  Patient is in agreement with this plan. -Urinalysis, complete -Urine culture  3. Intermittent bilateral flank pain No CVA tenderness today and PVR WNL. No PMH nephrolithiasis. I am not concerned for urinary obstruction or retention at this time. Low concern for infection as above. Upon questioning, patient states the pain Matarazzo be associated with days when he is completing chores around the house including laundry. While he denies a history of back pain or trauma, I suspect this pain Carey be MSK in origin. -PVR  Debroah Loop, [facility]  [facility] [address] [place], [place] [address] [phone]

## 2018-10-30 ENCOUNTER — Ambulatory Visit: Payer: Medicare Other

## 2018-10-30 LAB — CULTURE, URINE COMPREHENSIVE

## 2018-11-12 ENCOUNTER — Other Ambulatory Visit: Payer: Self-pay

## 2018-11-12 ENCOUNTER — Ambulatory Visit (INDEPENDENT_AMBULATORY_CARE_PROVIDER_SITE_OTHER): Payer: Medicare Other | Admitting: Gastroenterology

## 2018-11-12 VITALS — BP 156/87 | HR 67 | Temp 98.6°F | Ht 68.0 in | Wt 211.4 lb

## 2018-11-12 DIAGNOSIS — D509 Iron deficiency anemia, unspecified: Secondary | ICD-10-CM | POA: Diagnosis not present

## 2018-11-12 NOTE — Progress Notes (Signed)
[name] MD, MRCP(U.K) [address]  [place]  [place], [place] [address]  Main: [phone]  Fax: [phone]   Primary Care Physician: Letta Median, MD  Primary Gastroenterologist:  Dr. [name]   No chief complaint on file.   HPI: Brandon Benjamin is a 73 y.o. male   Summary of history :  He was initially seen On 08/28/17 for iron deficiency anemia.B12,folate normal   09/09/17- Colonoscopy : 6 tiny polypsx 4 adenomas were excised  EGD - 7 cm hiatal hernia- edematous appearing GE junction. 12/01/2017: EGD: Gastric polyp was seen. About 10 mm in size. Not resected as if it would have bled it would have affected the results of the capsule test. Biopsies were taken. Biopsies were done as of a large fundic gland polyp. Candidiasis of the esophagus also noted. 12/08/2017: Small bowel capsule: Normal study of the small bowel. No abnormalities noted. Iron studies have normalized from 01/12/18 RUQ USG 01/16/18 : simple hepatic cysts of the right hepatic lobe  01/13/18 : CMP,CBC-Normal  02/09/2018 : EGD: large polyp resected in stomach. Fundic gland polyp   Interval history2/08/2018-11/12/2018 No issues since last visit no overt blood loss in the form of melena.  Does not feel fatigued feels well.  No other new concerns.  He wants to have his labs checked to make sure his not anemic.    Current Outpatient Medications  Medication Sig Dispense Refill  . ACCU-CHEK AVIVA PLUS test strip     . ACCU-CHEK SOFTCLIX LANCETS lancets     . acetaminophen (TYLENOL 8 HOUR ARTHRITIS PAIN) 650 MG CR tablet Take 650 mg by mouth every 8 (eight) hours as needed for pain.    Marland Kitchen amLODipine (NORVASC) 10 MG tablet Take 10 mg by mouth daily.    . cetirizine (ZYRTEC) 10 MG tablet Take 10 mg by mouth daily.    . citalopram (CELEXA) 10 MG tablet Take 10 mg by mouth daily.    Marland Kitchen dicyclomine (BENTYL) 10 MG capsule     . docusate sodium (COLACE) 100 MG capsule Take 100 mg by  mouth 2 (two) times daily.    . fluticasone (FLONASE) 50 MCG/ACT nasal spray Place 2 sprays into both nostrils daily.     . hydrALAZINE (APRESOLINE) 25 MG tablet     . hydrochlorothiazide (HYDRODIURIL) 25 MG tablet Take 25 mg by mouth daily.     Marland Kitchen linaclotide (LINZESS) 290 MCG CAPS capsule Take 1 capsule (290 mcg total) by mouth daily before breakfast. 90 capsule 1  . LINZESS 290 MCG CAPS capsule Take 290 mcg by mouth daily.     Marland Kitchen lisinopril (PRINIVIL,ZESTRIL) 40 MG tablet Take 40 mg by mouth daily.    Marland Kitchen lovastatin (MEVACOR) 20 MG tablet Take 20 mg by mouth at bedtime.    . metFORMIN (GLUCOPHAGE) 500 MG tablet Take 500 mg by mouth 2 (two) times daily.     . metoprolol (LOPRESSOR) 100 MG tablet Take 100 mg by mouth 2 (two) times daily.     . Multiple Vitamin (MULTI-VITAMIN) tablet Take by mouth.    . Multiple Vitamin (MULTIVITAMIN WITH MINERALS) TABS tablet Take 1 tablet by mouth daily. Centrum Silver Multivitamin    . nystatin cream (MYCOSTATIN)     . omeprazole (PRILOSEC) 40 MG capsule Take 1 capsule (40 mg total) by mouth daily. 90 capsule 3  . potassium chloride (K-DUR) 10 MEQ tablet Take 10 mEq by mouth daily.     . tamsulosin (FLOMAX) 0.4 MG  CAPS capsule      No current facility-administered medications for this visit.     Allergies as of 11/12/2018  . (No Known Allergies)    ROS:  General: Negative for anorexia, weight loss, fever, chills, fatigue, weakness. ENT: Negative for hoarseness, difficulty swallowing , nasal congestion. CV: Negative for chest pain, angina, palpitations, dyspnea on exertion, peripheral edema.  Respiratory: Negative for dyspnea at rest, dyspnea on exertion, cough, sputum, wheezing.  GI: See history of present illness. GU:  Negative for dysuria, hematuria, urinary incontinence, urinary frequency, nocturnal urination.  Endo: Negative for unusual weight change.    Physical Examination:   There were no vitals taken for this visit.  General:  Well-nourished, well-developed in no acute distress.  Eyes: No icterus. Conjunctivae pink. Mouth: Oropharyngeal mucosa moist and pink , no lesions erythema or exudate. Lungs: Clear to auscultation bilaterally. Non-labored. Heart: Regular rate and rhythm, no murmurs rubs or gallops.  Abdomen: Bowel sounds are normal, nontender, nondistended, no hepatosplenomegaly or masses, no abdominal bruits or hernia , no rebound or guarding.   Extremities: No lower extremity edema. No clubbing or deformities. Neuro: Alert and oriented x 3.  Grossly intact. Skin: Warm and dry, no jaundice.   Psych: Alert and cooperative, normal mood and affect.   Imaging Studies: No results found.  Assessment and Plan:   Brandon Benjamin is a 73 y.o. y/o male  here to follow up for iron deficiency anemia. EGD showed a large hiatal hernia and edematous GE junction with bx showing no malignancy. Colonoscopy showed a few colon polyps that were excised.Normal capsule study of the small bowel .Likely cause of iron deficiency anemia is the large hiatal hernia that was seen. Doing well since last visit.  He wants to come in today to have his labs checked.  I will check a CBC and iron studies and if normal can follow-up with his primary care doctor in the future and refer back to see [place] as needed if there are any issues that come up  Dr [name]  MD,MRCP [facility]) Follow up in as needed

## 2018-11-13 LAB — CBC WITH DIFFERENTIAL/PLATELET
Basophils Absolute: 0.1 10*3/uL (ref 0.0–0.2)
Basos: 1 %
EOS (ABSOLUTE): 0.2 10*3/uL (ref 0.0–0.4)
Eos: 4 %
Hematocrit: 46 % (ref 37.5–51.0)
Hemoglobin: 16.1 g/dL (ref 13.0–17.7)
Immature Grans (Abs): 0 10*3/uL (ref 0.0–0.1)
Immature Granulocytes: 0 %
Lymphocytes Absolute: 1.8 10*3/uL (ref 0.7–3.1)
Lymphs: 31 %
MCH: 30.4 pg (ref 26.6–33.0)
MCHC: 35 g/dL (ref 31.5–35.7)
MCV: 87 fL (ref 79–97)
Monocytes Absolute: 0.5 10*3/uL (ref 0.1–0.9)
Monocytes: 8 %
Neutrophils Absolute: 3.3 10*3/uL (ref 1.4–7.0)
Neutrophils: 56 %
Platelets: 196 10*3/uL (ref 150–450)
RBC: 5.29 x10E6/uL (ref 4.14–5.80)
RDW: 13.6 % (ref 11.6–15.4)
WBC: 5.8 10*3/uL (ref 3.4–10.8)

## 2018-11-13 LAB — IRON,TIBC AND FERRITIN PANEL
Ferritin: 73 ng/mL (ref 30–400)
Iron Saturation: 31 % (ref 15–55)
Iron: 112 ug/dL (ref 38–169)
Total Iron Binding Capacity: 362 ug/dL (ref 250–450)
UIBC: 250 ug/dL (ref 111–343)

## 2018-11-15 ENCOUNTER — Encounter: Payer: Self-pay | Admitting: Gastroenterology

## 2018-11-19 ENCOUNTER — Telehealth: Payer: Self-pay

## 2018-11-19 NOTE — Telephone Encounter (Signed)
-----   Message from [name], MD sent at 11/15/2018 11:29 AM EDT ----- Sherald Hess inform CBC has been stable for > 6 months- suggest follow up with pcp , send copy of results to pcp please   Dr [name] MD,MRCP [facility]) Gastroenterology/Hepatology Pager: [phone]

## 2018-11-19 NOTE — Telephone Encounter (Signed)
Spoke with pt and informed him of lab results and Dr. Georgeann Oppenheim suggestions. Pt agrees.

## 2018-11-23 ENCOUNTER — Other Ambulatory Visit: Payer: Self-pay

## 2018-11-23 ENCOUNTER — Encounter: Payer: Self-pay | Admitting: Podiatry

## 2018-11-23 ENCOUNTER — Ambulatory Visit (INDEPENDENT_AMBULATORY_CARE_PROVIDER_SITE_OTHER): Payer: Medicare Other | Admitting: Podiatry

## 2018-11-23 DIAGNOSIS — M79676 Pain in unspecified toe(s): Secondary | ICD-10-CM

## 2018-11-23 DIAGNOSIS — B351 Tinea unguium: Secondary | ICD-10-CM

## 2018-11-23 DIAGNOSIS — E119 Type 2 diabetes mellitus without complications: Secondary | ICD-10-CM | POA: Diagnosis not present

## 2018-11-23 NOTE — Progress Notes (Signed)
Complaint:  Visit Type: Patient returns to my office for continued preventative foot care services. Complaint: Patient states" my nails have grown long and thick and become painful to walk and wear shoes" Patient has been diagnosed with DM with no foot complications. The patient presents for preventative foot care services.   Podiatric Exam: Vascular: dorsalis pedis and posterior tibial pulses are palpable bilateral. Capillary return is immediate. Temperature gradient is WNL. Skin turgor WNL  Sensorium: Normal Semmes Weinstein monofilament test. Normal tactile sensation bilaterally. Nail Exam: Pt has thick disfigured discolored nails with subungual debris noted bilateral entire nail hallux through fifth toenails Ulcer Exam: There is no evidence of ulcer or pre-ulcerative changes or infection. Orthopedic Exam: Muscle tone and strength are WNL. No limitations in general ROM. No crepitus or effusions noted. Foot type and digits show no abnormalities. Bony prominences are unremarkable. Skin: No Porokeratosis. No infection or ulcers  Diagnosis:  Onychomycosis, , Pain in right toe, pain in left toes  Treatment & Plan Procedures and Treatment: Consent by patient was obtained for treatment procedures. The patient understood the discussion of treatment and procedures well. All questions were answered thoroughly reviewed. Debridement of mycotic and hypertrophic toenails, 1 through 5 bilateral and clearing of subungual debris. No ulceration, no infection noted.  Return Visit-Office Procedure: Patient instructed to return to the office for a follow up visit 3 months for continued evaluation and treatment.    Jarae Nemmers DPM 

## 2018-11-25 ENCOUNTER — Other Ambulatory Visit: Payer: Medicare Other | Admitting: Urology

## 2018-12-08 ENCOUNTER — Ambulatory Visit (INDEPENDENT_AMBULATORY_CARE_PROVIDER_SITE_OTHER): Payer: Medicare Other | Admitting: Urology

## 2018-12-08 ENCOUNTER — Other Ambulatory Visit: Payer: Self-pay

## 2018-12-08 ENCOUNTER — Encounter: Payer: Self-pay | Admitting: Urology

## 2018-12-08 VITALS — BP 171/98 | HR 74 | Ht 68.0 in | Wt 206.0 lb

## 2018-12-08 DIAGNOSIS — N401 Enlarged prostate with lower urinary tract symptoms: Secondary | ICD-10-CM | POA: Diagnosis not present

## 2018-12-08 DIAGNOSIS — N138 Other obstructive and reflux uropathy: Secondary | ICD-10-CM

## 2018-12-08 DIAGNOSIS — R300 Dysuria: Secondary | ICD-10-CM

## 2018-12-08 LAB — MICROSCOPIC EXAMINATION
Bacteria, UA: NONE SEEN
Epithelial Cells (non renal): NONE SEEN /hpf (ref 0–10)

## 2018-12-08 LAB — URINALYSIS, COMPLETE
Bilirubin, UA: NEGATIVE
Leukocytes,UA: NEGATIVE
Nitrite, UA: NEGATIVE
Protein,UA: NEGATIVE
RBC, UA: NEGATIVE
Specific Gravity, UA: 1.02 (ref 1.005–1.030)
Urobilinogen, Ur: 0.2 mg/dL (ref 0.2–1.0)
pH, UA: 5 (ref 5.0–7.5)

## 2018-12-08 NOTE — Progress Notes (Signed)
   12/08/18  CC:  Chief Complaint  Patient presents with  . Cysto    HPI: 73 year old male status, who presents today for cystoscopic evaluation of ongoing dysuria, slow stream, difficulty urinating episode of hematuria with underlying infection.  Please see previous notes for details.  Today he is doing better.  He does have some mild dysuria intermittently but not consistently.   Vitals:   12/08/18 1556  BP: (!) 171/98  Pulse: 74   NED. A&Ox3.   No respiratory distress   Abd soft, NT, ND Normal phallus with bilateral descended testicles  Cystoscopy Procedure Note  Patient identification was confirmed, informed consent was obtained, and patient was prepped using Betadine solution.  Lidocaine jelly was administered per urethral meatus.     Pre-Procedure: - Inspection reveals a normal caliber ureteral meatus.  Procedure: The flexible cystoscope was introduced without difficulty - No urethral strictures/lesions are present. - Coapting tissue within prostate at apex -Slightly irregular prostatic fossa which is widely patent especially bladder neck.  There is mild amount of prostatic regrowth on the right lateral wall but no regrowth on the left.  Scope passes easily.  No adherent stone material or bullous edema is previously appreciated. - Bilateral ureteral orifices identified - Bladder mucosa  reveals no ulcers, tumors, or lesions - No bladder stones - Minimal trabeculation  Retroflexion consistent with HoLEP defect, will widely open bladder neck.  Post-Procedure: - Patient tolerated the procedure well  Assessment/ Plan:  1. BPH with obstruction/lower urinary tract symptoms Again today, no evidence of stricture with widely open fossa No additional dystrophic calcification seen as seen previously 03/2018 Small amount of right prostatic regrowth but do not suspect ongoing obstruction - Urinalysis, Complete  2. Dysuria Urinalysis today negative Cystoscopy fairly  unremarkable Etiology unclear   Return in about 6 weeks (around 01/19/2019) for uroflow (full bladder), PVR, IPSS.  Hollice Espy, MD

## 2019-01-19 ENCOUNTER — Ambulatory Visit: Payer: Medicare Other | Admitting: Urology

## 2019-02-22 ENCOUNTER — Ambulatory Visit (INDEPENDENT_AMBULATORY_CARE_PROVIDER_SITE_OTHER): Payer: Medicare Other | Admitting: Podiatry

## 2019-02-22 ENCOUNTER — Encounter: Payer: Self-pay | Admitting: Podiatry

## 2019-02-22 ENCOUNTER — Other Ambulatory Visit: Payer: Self-pay

## 2019-02-22 DIAGNOSIS — E119 Type 2 diabetes mellitus without complications: Secondary | ICD-10-CM

## 2019-02-22 DIAGNOSIS — B351 Tinea unguium: Secondary | ICD-10-CM

## 2019-02-22 DIAGNOSIS — M79676 Pain in unspecified toe(s): Secondary | ICD-10-CM

## 2019-02-22 NOTE — Progress Notes (Signed)
Complaint:  Visit Type: Patient returns to my office for continued preventative foot care services. Complaint: Patient states" my nails have grown long and thick and become painful to walk and wear shoes" Patient has been diagnosed with DM with no foot complications. The patient presents for preventative foot care services.   Podiatric Exam: Vascular: dorsalis pedis and posterior tibial pulses are palpable bilateral. Capillary return is immediate. Temperature gradient is WNL. Skin turgor WNL  Sensorium: Normal Semmes Weinstein monofilament test. Normal tactile sensation bilaterally. Nail Exam: Pt has thick disfigured discolored nails with subungual debris noted bilateral entire nail hallux through fifth toenails Ulcer Exam: There is no evidence of ulcer or pre-ulcerative changes or infection. Orthopedic Exam: Muscle tone and strength are WNL. No limitations in general ROM. No crepitus or effusions noted. Foot type and digits show no abnormalities. Bony prominences are unremarkable. Skin: No Porokeratosis. No infection or ulcers  Diagnosis:  Onychomycosis, , Pain in right toe, pain in left toes  Treatment & Plan Procedures and Treatment: Consent by patient was obtained for treatment procedures. The patient understood the discussion of treatment and procedures well. All questions were answered thoroughly reviewed. Debridement of mycotic and hypertrophic toenails, 1 through 5 bilateral and clearing of subungual debris. No ulceration, no infection noted.  Return Visit-Office Procedure: Patient instructed to return to the office for a follow up visit 3 months for continued evaluation and treatment.    Gardiner Barefoot DPM

## 2019-02-25 ENCOUNTER — Telehealth: Payer: Self-pay | Admitting: Gastroenterology

## 2019-02-25 NOTE — Telephone Encounter (Signed)
Pt wife is  Calling  Pt is having Hemorids and she would like them either surgically removed or banding please call her do give her some information on what route they should go her cb [phone]

## 2019-02-25 NOTE — Telephone Encounter (Signed)
Brandon Benjamin patient's wife is calling back stating that he does not want to do the banding for his hemorrhoids he wants to have a colonoscopy & them removed at that time.Please advise.

## 2019-02-26 NOTE — Telephone Encounter (Signed)
Spoke with pt regarding his request to have his hemorrhoids removed. I explained that Dr. Vicente Males would like to discuss this with him in office. Pt agrees and has been scheduled.

## 2019-02-26 NOTE — Telephone Encounter (Signed)
WE can discuss at office visit  Dr [name] MD,MRCP [facility]) Gastroenterology/Hepatology Pager: [phone]

## 2019-03-10 ENCOUNTER — Encounter: Payer: Self-pay | Admitting: Urology

## 2019-03-10 ENCOUNTER — Ambulatory Visit (INDEPENDENT_AMBULATORY_CARE_PROVIDER_SITE_OTHER): Payer: Medicare Other | Admitting: Urology

## 2019-03-10 ENCOUNTER — Other Ambulatory Visit: Payer: Self-pay

## 2019-03-10 VITALS — BP 141/97 | HR 71 | Ht 68.0 in | Wt 200.0 lb

## 2019-03-10 DIAGNOSIS — R109 Unspecified abdominal pain: Secondary | ICD-10-CM

## 2019-03-10 DIAGNOSIS — R3916 Straining to void: Secondary | ICD-10-CM

## 2019-03-10 DIAGNOSIS — E7253 Hyperoxaluria: Secondary | ICD-10-CM | POA: Diagnosis not present

## 2019-03-10 DIAGNOSIS — R300 Dysuria: Secondary | ICD-10-CM

## 2019-03-10 DIAGNOSIS — N138 Other obstructive and reflux uropathy: Secondary | ICD-10-CM

## 2019-03-10 DIAGNOSIS — N401 Enlarged prostate with lower urinary tract symptoms: Secondary | ICD-10-CM | POA: Diagnosis not present

## 2019-03-10 LAB — MICROSCOPIC EXAMINATION
Bacteria, UA: NONE SEEN
Epithelial Cells (non renal): NONE SEEN /hpf (ref 0–10)
RBC: NONE SEEN /hpf (ref 0–2)
WBC, UA: >30 /hpf — AB (ref 0–5)

## 2019-03-10 LAB — URINALYSIS, COMPLETE
Bilirubin, UA: NEGATIVE
Glucose, UA: NEGATIVE
Ketones, UA: NEGATIVE
Nitrite, UA: NEGATIVE
Protein,UA: NEGATIVE
RBC, UA: NEGATIVE
Specific Gravity, UA: 1.02 (ref 1.005–1.030)
Urobilinogen, Ur: 0.2 mg/dL (ref 0.2–1.0)
pH, UA: 5.5 (ref 5.0–7.5)

## 2019-03-10 LAB — BLADDER SCAN AMB NON-IMAGING: Scan Result: 41

## 2019-03-10 NOTE — Patient Instructions (Signed)
Chronic Back Pain When back pain lasts longer than 3 months, it is called chronic back pain.The cause of your back pain Pillard not be known. Some common causes include:  Wear and tear (degenerative disease) of the bones, ligaments, or disks in your back.  Inflammation and stiffness in your back (arthritis). People who have chronic back pain often go through certain periods in which the pain is more intense (flare-ups). Many people can learn to manage the pain with home care. Follow these instructions at home: Pay attention to any changes in your symptoms. Take these actions to help with your pain: Activity   Avoid bending and other activities that make the problem worse.  Maintain a proper position when standing or sitting: ? When standing, keep your upper back and neck straight, with your shoulders pulled back. Avoid slouching. ? When sitting, keep your back straight and relax your shoulders. Do not round your shoulders or pull them backward.  Do not sit or stand in one place for long periods of time.  Take brief periods of rest throughout the day. This will reduce your pain. Resting in a lying or standing position is usually better than sitting to rest.  When you are resting for longer periods, mix in some mild activity or stretching between periods of rest. This will help to prevent stiffness and pain.  Get regular exercise. Ask your health care provider what activities are safe for you.  Do not lift anything that is heavier than 10 lb (4.5 kg). Always use proper lifting technique, which includes: ? Bending your knees. ? Keeping the load close to your body. ? Avoiding twisting.  Sleep on a firm mattress in a comfortable position. Try lying on your side with your knees slightly bent. If you lie on your back, put a pillow under your knees. Managing pain  If directed, apply ice to the painful area. Your health care provider Lingard recommend applying ice during the first 24-48 hours after  a flare-up begins. ? Put ice in a plastic bag. ? Place a towel between your skin and the bag. ? Leave the ice on for 20 minutes, 2-3 times per day.  If directed, apply heat to the affected area as often as told by your health care provider. Use the heat source that your health care provider recommends, such as a moist heat pack or a heating pad. ? Place a towel between your skin and the heat source. ? Leave the heat on for 20-30 minutes. ? Remove the heat if your skin turns bright red. This is especially important if you are unable to feel pain, heat, or cold. You Auld have a greater risk of getting burned.  Try soaking in a warm tub.  Take over-the-counter and prescription medicines only as told by your health care provider.  Keep all follow-up visits as told by your health care provider. This is important. Contact a health care provider if:  You have pain that is not relieved with rest or medicine. Get help right away if:  You have weakness or numbness in one or both of your legs or feet.  You have trouble controlling your bladder or your bowels.  You have nausea or vomiting.  You have pain in your abdomen.  You have shortness of breath or you faint. This information is not intended to replace advice given to you by your health care provider. Make sure you discuss any questions you have with your health care provider. Document Released: 04/18/2004   Document Revised: 07/02/2018 Document Reviewed: 09/18/2016 Elsevier Patient Education  2020 Elsevier Inc.  

## 2019-03-10 NOTE — Progress Notes (Signed)
03/10/2019 3:25 PM   Brandon Benjamin [birth year] [id number]  Referring provider: Letta Median, MD [place],  [place] [address]  Chief Complaint  Patient presents with  . Hematuria    HPI:  Brandon Benjamin returns in evaluation of BPH, LUTS and dysuria. He follows with Dr. Erlene Quan but she was called out to the OR. He underwent cysto in Sep 2020 which showed no evidence of stricture with widely open fossa. No additional dystrophic calcification seen as seen previously 03/2018. TURP cystolithopaxy done Apr 2019. PSA was 1.03 May 2018.   He was due for a uroflow (full bladder), PVR, IPSS. PVR today is 41 ml. He is voiding with his usual straining to void. No gross hematuria. He was seen at [facility] and restarted on tamsulosin.He hasnt noticed much improvement in the flow. He had calcium oxalate crystals in the urine and was told he might have stones. UA was sent over and no hematuria (0-2 rbc). Culture negative. He has back pain. He hasn't had kidney stones before, but wonders if he has one.   PMH: Past Medical History:  Diagnosis Date  . Anemia   . Anxiety   . Balanitis   . Balanitis   . BPH (benign prostatic hyperplasia)   . Diabetes ([place])   . Erectile dysfunction   . GERD (gastroesophageal reflux disease)   . History of kidney stones   . HLD (hyperlipidemia)   . HTN (hypertension)   . Iron deficiency anemia due to chronic blood loss 09/04/2017  . Over weight   . Priapism   . Scrotal cyst   . Urinary urgency     Surgical History: Past Surgical History:  Procedure Laterality Date  . CHOLECYSTECTOMY N/A 12/25/2016   Procedure: LAPAROSCOPIC CHOLECYSTECTOMY WITH INTRAOPERATIVE CHOLANGIOGRAM;  Surgeon: Jules Husbands, MD;  Location: [facility] ORS;  Service: General;  Laterality: N/A;  . COLONOSCOPY WITH PROPOFOL N/A 09/09/2017   Procedure: COLONOSCOPY WITH PROPOFOL;  Surgeon: [name], MD;  Location: [facility] ENDOSCOPY;  Service: Gastroenterology;  Laterality:  N/A;  . CYSTOSCOPY WITH LITHOLAPAXY N/A 07/16/2017   Procedure: CYSTOSCOPY WITH LITHOLAPAXY;  Surgeon: Hollice Espy, MD;  Location: [facility] ORS;  Service: Urology;  Laterality: N/A;  . ERCP N/A 11/28/2016   Procedure: ENDOSCOPIC RETROGRADE CHOLANGIOPANCREATOGRAPHY (ERCP);  Surgeon: [name], MD;  Location: [facility] END[facility]COPY;  Service: Endoscopy;  Laterality: N/A;  . ESOPHAGOGASTRODUODENOSCOPY (EGD) WITH PROPOFOL N/A 09/09/2017   Procedure: ESOPHAGOGASTRODUODENOSCOPY (EGD) WITH PROPOFOL;  Surgeon: [name], MD;  Location: [facility] ENDOSCOPY;  Service: Gastroenterology;  Laterality: N/A;  . ESOPHAGOGASTRODUODENOSCOPY (EGD) WITH PROPOFOL N/A 12/01/2017   Procedure: ESOPHAGOGASTRODUODENOSCOPY (EGD) WITH PROPOFOL;  Surgeon: [name], MD;  Location: [facility] ENDOSCOPY;  Service: Gastroenterology;  Laterality: N/A;  . ESOPHAGOGASTRODUODENOSCOPY (EGD) WITH PROPOFOL N/A 02/09/2018   Procedure: ESOPHAGOGASTRODUODENOSCOPY (EGD) WITH PROPOFOL;  Surgeon: [name], MD;  Location: [facility] ENDOSCOPY;  Service: Gastroenterology;  Laterality: N/A;  . GIVENS CAPSULE STUDY N/A 12/01/2017   Procedure: GIVENS CAPSULE STUDY;  Surgeon: [name], MD;  Location: [facility] ENDOSCOPY;  Service: Gastroenterology;  Laterality: N/A;  . HOLEP-LASER ENUCLEATION OF THE PROSTATE WITH MORCELLATION N/A 07/11/2015   Procedure: HOLEP-LASER ENUCLEATION OF THE PROSTATE WITH MORCELLATION;  Surgeon: Hollice Espy, MD;  Location: [facility] ORS;  Service: Urology;  Laterality: N/A;  . TRANSURETHRAL RESECTION OF PROSTATE N/A 07/16/2017   Procedure: TRANSURETHRAL RESECTION OF THE PROSTATE (TURP);  Surgeon: Hollice Espy, MD;  Location: [facility] ORS;  Service: Urology;  Laterality: N/A;  . UMBILICAL HERNIA REPAIR  12/25/2016   Procedure: HERNIA REPAIR UMBILICAL ADULT;  Surgeon: Jules Husbands, MD;  Location: [facility] ORS;  Service: General;;    Home Medications:  Allergies as of 03/10/2019   No Known Allergies     Medication List       Accurate as of  March 10, 2019  3:25 PM. If you have any questions, ask your nurse or doctor.        Accu-Chek Aviva Plus test strip Generic drug: glucose blood   Accu-Chek Softclix Lancets lancets   amLODipine 10 MG tablet Commonly known as: NORVASC Take 10 mg by mouth daily.   cetirizine 10 MG tablet Commonly known as: ZYRTEC Take 10 mg by mouth daily.   citalopram 10 MG tablet Commonly known as: CELEXA Take 10 mg by mouth daily.   dicyclomine 10 MG capsule Commonly known as: BENTYL   docusate sodium 100 MG capsule Commonly known as: COLACE Take 100 mg by mouth 2 (two) times daily.   fluticasone 50 MCG/ACT nasal spray Commonly known as: FLONASE Place 2 sprays into both nostrils daily.   hydrALAZINE 25 MG tablet Commonly known as: APRESOLINE   hydrochlorothiazide 25 MG tablet Commonly known as: HYDRODIURIL Take 25 mg by mouth daily.   linaclotide 290 MCG Caps capsule Commonly known as: Linzess Take 1 capsule (290 mcg total) by mouth daily before breakfast.   Linzess 290 MCG Caps capsule Generic drug: linaclotide Take 290 mcg by mouth daily.   lisinopril 40 MG tablet Commonly known as: ZESTRIL Take 40 mg by mouth daily.   lovastatin 20 MG tablet Commonly known as: MEVACOR Take 20 mg by mouth at bedtime.   metFORMIN 500 MG tablet Commonly known as: GLUCOPHAGE Take 500 mg by mouth 2 (two) times daily.   metoprolol tartrate 100 MG tablet Commonly known as: LOPRESSOR Take 100 mg by mouth 2 (two) times daily.   Multi-Vitamin tablet Take by mouth.   multivitamin with minerals Tabs tablet Take 1 tablet by mouth daily. Centrum Silver Multivitamin   nystatin cream Commonly known as: MYCOSTATIN   omeprazole 40 MG capsule Commonly known as: PRILOSEC Take 1 capsule (40 mg total) by mouth daily.   potassium chloride 10 MEQ tablet Commonly known as: KLOR-CON Take 10 mEq by mouth daily.   tamsulosin 0.4 MG Caps capsule Commonly known as: FLOMAX   Tylenol 8 Hour  Arthritis Pain 650 MG CR tablet Generic drug: acetaminophen Take 650 mg by mouth every 8 (eight) hours as needed for pain.       Allergies: No Known Allergies  Family History: Family History  Problem Relation Age of Onset  . Cancer Mother        blood stream  . High blood pressure Mother   . Hypertension Sister   . Anemia Sister   . Prostate cancer Neg Hx   . Bladder Cancer Neg Hx   . Kidney disease Neg Hx     Social History:  reports that he quit smoking about 23 years ago. His smoking use included cigarettes. He has never used smokeless tobacco. He reports that he does not drink alcohol or use drugs.  ROS: UROLOGY Frequent Urination?: No Hard to postpone urination?: Yes Burning/pain with urination?: Yes Get up at night to urinate?: Yes Leakage of urine?: No Urine stream starts and stops?: Yes Trouble starting stream?: Yes Do you have to strain to urinate?: Yes Blood in urine?: Yes Urinary tract infection?: No Sexually transmitted disease?: No Injury to kidneys or bladder?: No Painful  intercourse?: No Weak stream?: Yes Erection problems?: No Penile pain?: Yes  Gastrointestinal Nausea?: No Vomiting?: No Indigestion/heartburn?: No Diarrhea?: No Constipation?: No  Constitutional Fever: No Night sweats?: No Weight loss?: No Fatigue?: No  Skin Skin rash/lesions?: No Itching?: No  Eyes Blurred vision?: No Double vision?: No  Ears/Nose/Throat Sore throat?: No Sinus problems?: No  Hematologic/Lymphatic Swollen glands?: No Easy bruising?: No  Cardiovascular Leg swelling?: No Chest pain?: No  Respiratory Cough?: No Shortness of breath?: No  Endocrine Excessive thirst?: No  Musculoskeletal Back pain?: Yes Joint pain?: No  Neurological Headaches?: No Dizziness?: No  Psychologic Depression?: No Anxiety?: No  Physical Exam: BP (!) 141/97   Pulse 71   Ht 5\' 8"  (1.727 m)   Wt 200 lb (90.7 kg)   BMI 30.41 kg/m   Constitutional:   Alert and oriented, No acute distress. HEENT: [place] AT, moist mucus membranes.  Trachea midline, no masses. Cardiovascular: No clubbing, cyanosis, or edema. Respiratory: Normal respiratory effort, no increased work of breathing. GI: Abdomen is soft, nontender, nondistended, no abdominal masses GU: No CVA tenderness Lymph: No cervical or inguinal lymphadenopathy. Skin: No rashes, bruises or suspicious lesions. Neurologic: Grossly intact, no focal deficits, moving all 4 extremities. Psychiatric: Normal mood and affect.  Laboratory Data: Lab Results  Component Value Date   WBC 5.8 11/12/2018   HGB 16.1 11/12/2018   HCT 46.0 11/12/2018   MCV 87 11/12/2018   PLT 196 11/12/2018    Lab Results  Component Value Date   CREATININE 1.30 (H) 01/13/2018    No results found for: PSA  No results found for: TESTOSTERONE  No results found for: HGBA1C  Urinalysis    Component Value Date/Time   COLORURINE YELLOW (A) 01/14/2018 1455   APPEARANCEUR Clear 12/08/2018 1525   LABSPEC 1.019 01/14/2018 1455   PHURINE 6.0 01/14/2018 1455   GLUCOSEU 2+ (A) 12/08/2018 [place] 01/14/2018 1455   BILIRUBINUR Negative 12/08/2018 1525   KETONESUR NEGATIVE 01/14/2018 1455   PROTEINUR Negative 12/08/2018 1525   PROTEINUR NEGATIVE 01/14/2018 1455   NITRITE Negative 12/08/2018 1525   NITRITE NEGATIVE 01/14/2018 1455   LEUKOCYTESUR Negative 12/08/2018 1525    Lab Results  Component Value Date   LABMICR See below: 12/08/2018   WBCUA 11-30 (A) 12/08/2018   RBCUA 0-2 04/21/2018   LABEPIT None seen 12/08/2018   MUCUS Present (A) 10/28/2018   BACTERIA None seen 12/08/2018    Pertinent Imaging: n/a No results found for this or any previous visit. No results found for this or any previous visit. No results found for this or any previous visit. No results found for this or any previous visit. No results found for this or any previous visit. No results found for this or any previous  visit. No results found for this or any previous visit. No results found for this or any previous visit.  Assessment & Plan:    1. Dysuria, weak stream - continue tamsulosin. PVR normal.  - Urinalysis, Complete  2. Back pain, oxaluria - check a renal US and KUB - f/u to review   No follow-ups on file.  Festus Aloe, MD  [facility] [address], [place], [place] [address] [phone]

## 2019-03-22 ENCOUNTER — Ambulatory Visit (INDEPENDENT_AMBULATORY_CARE_PROVIDER_SITE_OTHER): Payer: Medicare Other | Admitting: Gastroenterology

## 2019-03-22 ENCOUNTER — Other Ambulatory Visit: Payer: Self-pay

## 2019-03-22 VITALS — BP 130/82 | HR 61 | Temp 97.5°F | Ht 68.0 in | Wt 206.4 lb

## 2019-03-22 DIAGNOSIS — L290 Pruritus ani: Secondary | ICD-10-CM

## 2019-03-22 NOTE — Patient Instructions (Signed)
Hemorrhoids Hemorrhoids are swollen veins that [place]man develop:  In the butt (rectum). These are called internal hemorrhoids.  Around the opening of the butt (anus). These are called external hemorrhoids. Hemorrhoids can cause pain, itching, or bleeding. Most of the time, they do not cause serious problems. They usually get better with diet changes, lifestyle changes, and other home treatments. What are the causes? This condition Tibbett be caused by:  Having trouble pooping (constipation).  Pushing hard (straining) to poop.  Watery poop (diarrhea).  Pregnancy.  Being very overweight (obese).  Sitting for long periods of time.  Heavy lifting or other activity that causes you to strain.  Anal sex.  Riding a bike for a long period of time. What are the signs or symptoms? Symptoms of this condition include:  Pain.  Itching or soreness in the butt.  Bleeding from the butt.  Leaking poop.  Swelling in the area.  One or more lumps around the opening of your butt. How is this diagnosed? A doctor can often diagnose this condition by looking at the affected area. The doctor Leung also:  Do an exam that involves feeling the area with a gloved hand (digital rectal exam).  Examine the area inside your butt using a small tube (anoscope).  Order blood tests. This Effinger be done if you have lost a lot of blood.  Have you get a test that involves looking inside the colon using a flexible tube with a camera on the end (sigmoidoscopy or colonoscopy). How is this treated? This condition can usually be treated at home. Your doctor Gloss tell you to change what you eat, make lifestyle changes, or try home treatments. If these do not help, procedures can be done to remove the hemorrhoids or make them smaller. These Tiley involve:  Placing rubber bands at the base of the hemorrhoids to cut off their blood supply.  Injecting medicine into the hemorrhoids to shrink them.  Shining a type of light  energy onto the hemorrhoids to cause them to fall off.  Doing surgery to remove the hemorrhoids or cut off their blood supply. Follow these instructions at home: Eating and drinking   Eat foods that have a lot of fiber in them. These include whole grains, beans, nuts, fruits, and vegetables.  Ask your doctor about taking products that have added fiber (fibersupplements).  Reduce the amount of fat in your diet. You can do this by: ? Eating low-fat dairy products. ? Eating less red meat. ? Avoiding processed foods.  Drink enough fluid to keep your pee (urine) pale yellow. Managing pain and swelling   Take a warm-water bath (sitz bath) for 20 minutes to ease pain. Do this 3-4 times a day. You Tompson do this in a bathtub or using a portable sitz bath that fits over the toilet.  If told, put ice on the painful area. It Maiello be helpful to use ice between your warm baths. ? Put ice in a plastic bag. ? Place a towel between your skin and the bag. ? Leave the ice on for 20 minutes, 2-3 times a day. General instructions  Take over-the-counter and prescription medicines only as told by your doctor. ? Medicated creams and medicines Dona be used as told.  Exercise often. Ask your doctor how much and what kind of exercise is best for you.  Go to the bathroom when you have the urge to poop. Do not wait.  Avoid pushing too hard when you poop.  Keep your   butt dry and clean. Use wet toilet paper or moist towelettes after pooping.  Do not sit on the toilet for a long time.  Keep all follow-up visits as told by your doctor. This is important. Contact a doctor if you:  Have pain and swelling that do not get better with treatment or medicine.  Have trouble pooping.  Cannot poop.  Have pain or swelling outside the area of the hemorrhoids. Get help right away if you have:  Bleeding that will not stop. Summary  Hemorrhoids are swollen veins in the butt or around the opening of the butt.   They can cause pain, itching, or bleeding.  Eat foods that have a lot of fiber in them. These include whole grains, beans, nuts, fruits, and vegetables.  Take a warm-water bath (sitz bath) for 20 minutes to ease pain. Do this 3-4 times a day. This information is not intended to replace advice given to you by your health care provider. Make sure you discuss any questions you have with your health care provider. Document Released: 12/19/2007 Document Revised: 03/19/2018 Document Reviewed: 07/31/2017 Elsevier Patient Education  2020 Elsevier Inc.  

## 2019-03-22 NOTE — Progress Notes (Signed)
Brandon Bellows MD, MRCP(U.K) [address]  [place]  [place], [place] [address]  Main: [phone]  Fax: [phone]   Primary Care Physician: Brandon Median, MD  Primary Gastroenterologist:  Dr. Jonathon Benjamin   Issues with hemorrhoids   HPI: Brandon Benjamin is a 73 y.o. male   Summary of history :  He was initially seen On 08/28/17 for iron deficiency anemia.B12,folate normal   09/09/17- Colonoscopy : 6 tiny polyps x 4 adenomas were excised  EGD - 7 cm hiatal hernia- edematous appearing GE junction. 12/01/2017: EGD: Gastric polyp was seen. About 10 mm in size. Not resected as if it would have bled it would have affected the results of the capsule test. Biopsies were taken. Biopsies were done as of a large fundic gland polyp. Candidiasis of the esophagus also noted. 12/08/2017: Small bowel capsule: Normal study of the small bowel. No abnormalities noted. Iron studies have normalized from 01/12/18 RUQ USG 01/16/18 : simple hepatic cysts of the right hepatic lobe  01/13/18 : CMP,CBC-Normal  02/09/2018 : EGD: large polyp resected in stomach. Fundic gland polyp   Interval history8/20/2020-03/22/2019  States that the main issue he is here to see me today is that after bowel movement he has an abnormal sensation in the perianal area.  He has had a bit of itching.  He has had constipation in the past.  But taking a laxative and now having adequate bowel movements.  Commenced on Preparation H which has helped.  Denies any blood on the stool.    Current Outpatient Medications  Medication Sig Dispense Refill  . ACCU-CHEK AVIVA PLUS test strip     . ACCU-CHEK SOFTCLIX LANCETS lancets     . acetaminophen (TYLENOL 8 HOUR ARTHRITIS PAIN) 650 MG CR tablet Take 650 mg by mouth every 8 (eight) hours as needed for pain.    Marland Kitchen amLODipine (NORVASC) 10 MG tablet Take 10 mg by mouth daily.    . cetirizine (ZYRTEC) 10 MG tablet Take 10 mg by mouth daily.    . citalopram  (CELEXA) 10 MG tablet Take 10 mg by mouth daily.    Marland Kitchen dicyclomine (BENTYL) 10 MG capsule     . docusate sodium (COLACE) 100 MG capsule Take 100 mg by mouth 2 (two) times daily.    . fluticasone (FLONASE) 50 MCG/ACT nasal spray Place 2 sprays into both nostrils daily.     . hydrALAZINE (APRESOLINE) 25 MG tablet     . hydrochlorothiazide (HYDRODIURIL) 25 MG tablet Take 25 mg by mouth daily.     Marland Kitchen linaclotide (LINZESS) 290 MCG CAPS capsule Take 1 capsule (290 mcg total) by mouth daily before breakfast. 90 capsule 1  . LINZESS 290 MCG CAPS capsule Take 290 mcg by mouth daily.     Marland Kitchen lisinopril (PRINIVIL,ZESTRIL) 40 MG tablet Take 40 mg by mouth daily.    Marland Kitchen lovastatin (MEVACOR) 20 MG tablet Take 20 mg by mouth at bedtime.    . metFORMIN (GLUCOPHAGE) 500 MG tablet Take 500 mg by mouth 2 (two) times daily.     . metoprolol (LOPRESSOR) 100 MG tablet Take 100 mg by mouth 2 (two) times daily.     . Multiple Vitamin (MULTI-VITAMIN) tablet Take by mouth.    . Multiple Vitamin (MULTIVITAMIN WITH MINERALS) TABS tablet Take 1 tablet by mouth daily. Centrum Silver Multivitamin    . nystatin cream (MYCOSTATIN)     . omeprazole (PRILOSEC) 40 MG capsule Take 1 capsule (40 mg total) by  mouth daily. 90 capsule 3  . potassium chloride (K-DUR) 10 MEQ tablet Take 10 mEq by mouth daily.     . tamsulosin (FLOMAX) 0.4 MG CAPS capsule      No current facility-administered medications for this visit.    Allergies as of 03/22/2019  . (No Known Allergies)    ROS:  General: Negative for anorexia, weight loss, fever, chills, fatigue, weakness. ENT: Negative for hoarseness, difficulty swallowing , nasal congestion. CV: Negative for chest pain, angina, palpitations, dyspnea on exertion, peripheral edema.  Respiratory: Negative for dyspnea at rest, dyspnea on exertion, cough, sputum, wheezing.  GI: See history of present illness. GU:  Negative for dysuria, hematuria, urinary incontinence, urinary frequency, nocturnal  urination.  Endo: Negative for unusual weight change.    Physical Examination:   There were no vitals taken for this visit.  General: Well-nourished, well-developed in no acute distress.  Eyes: No icterus. Conjunctivae pink. Neuro: Alert and oriented x 3.  Grossly intact. Skin: , no jaundice.   Psych: Alert and cooperative, normal mood and affect.   Imaging Studies: No results found.  Assessment and Plan:   Brandon Benjamin is a 73 y.o. y/o male with a history of iron deficiency anemia. EGD showed a large hiatal hernia and edematous GE junction with bx showing no malignancy. Colonoscopy showed a few colon polyps that were excised.Normal capsule study of the small bowel .Likely cause of iron deficiency anemia is the large hiatal hernia that was seen.  Today is here to see me for perianal discomfort.  Itching.  History of constipation.  Overall he seems to be doing better with Preparation H, laxatives.  We discussed about conservative management of internal hemorrhoids including use of stool softeners, high-fiber diet.  I will provide him samples of fiber pills to take daily.  Also discussed perianal hygiene, sitz bath.  We will try this conservative in management for 4 to 6 weeks.  I will discuss with him in 4 to 6 weeks to see how he is doing.   Dr Brandon Bellows  MD,MRCP [facility]) Follow up in 4 weeks video visit

## 2019-04-05 ENCOUNTER — Telehealth: Payer: Self-pay | Admitting: Gastroenterology

## 2019-04-05 NOTE — Telephone Encounter (Signed)
Called pt to inform him that he's okay to take the Metamucil fiber supplement instead of the Fiber Choice.  Unable to contact. Left detailed VM.

## 2019-04-05 NOTE — Telephone Encounter (Signed)
Pt wife left vm regarding pt wanting to see if pt can take Medamuzol instead of Fiber choice please call her back

## 2019-04-12 ENCOUNTER — Ambulatory Visit
Admission: RE | Admit: 2019-04-12 | Discharge: 2019-04-12 | Disposition: A | Discharge status: Home / Self Care | Payer: Medicare Other | Source: Ambulatory Visit | Attending: Urology | Admitting: Urology

## 2019-04-12 ENCOUNTER — Other Ambulatory Visit: Payer: Self-pay

## 2019-04-12 DIAGNOSIS — R109 Unspecified abdominal pain: Secondary | ICD-10-CM

## 2019-04-12 DIAGNOSIS — E7253 Hyperoxaluria: Secondary | ICD-10-CM | POA: Insufficient documentation

## 2019-04-14 ENCOUNTER — Encounter: Payer: Self-pay | Admitting: Urology

## 2019-04-14 ENCOUNTER — Other Ambulatory Visit: Payer: Self-pay

## 2019-04-14 ENCOUNTER — Ambulatory Visit (INDEPENDENT_AMBULATORY_CARE_PROVIDER_SITE_OTHER): Payer: Medicare Other | Admitting: Urology

## 2019-04-14 VITALS — BP 185/92 | HR 71 | Ht 68.0 in | Wt 204.0 lb

## 2019-04-14 DIAGNOSIS — N200 Calculus of kidney: Secondary | ICD-10-CM | POA: Diagnosis not present

## 2019-04-14 DIAGNOSIS — N401 Enlarged prostate with lower urinary tract symptoms: Secondary | ICD-10-CM

## 2019-04-14 DIAGNOSIS — N138 Other obstructive and reflux uropathy: Secondary | ICD-10-CM

## 2019-04-14 DIAGNOSIS — R300 Dysuria: Secondary | ICD-10-CM

## 2019-04-14 NOTE — Progress Notes (Signed)
04/14/2019 9:26 AM   Brandon Benjamin 1946/01/31 [id number]  Referring provider: Letta Median, MD [place],  [place] [address]  Chief Complaint  Patient presents with  . Follow-up    HPI: 74 year old male with history of BPH/bladder stones status post HoLEP (2017) and more recently cystolitholapaxy/TURP  (2019) who returns with urinary symptoms including slow stream, hematuria associated with urinary tract infection in 10/2018.  Cystoscopic findings revealed no further dystrophic calcifications and a widely patent prostatic fossa.  He was resumed on Flomax by his primary care physician and his symptoms have almost fully resolved.  Is very pleased with his voiding symptoms now.  He is a recently good stream especially in the mornings which wane sometimes in the afternoons and evenings.  He feels like he empties his bladder well.  His dysuria has resolved.  No further episodes of gross hematuria.  He returns today with follow-up KUB and renal ultrasound.  This was ordered secondary to having calcium oxalate crystals in his urine.  He was told he might have stones.  He has never had a stone episode before denies any flank pain.  KUB today shows no evidence of stone although it is about obscured with bowel.  Renal ultrasound does show a possible 6 mm stone the right lower pole.   PMH: Past Medical History:  Diagnosis Date  . Anemia   . Anxiety   . Balanitis   . Balanitis   . BPH (benign prostatic hyperplasia)   . Diabetes ([place])   . Erectile dysfunction   . GERD (gastroesophageal reflux disease)   . History of kidney stones   . HLD (hyperlipidemia)   . HTN (hypertension)   . Iron deficiency anemia due to chronic blood loss 09/04/2017  . Over weight   . Priapism   . Scrotal cyst   . Urinary urgency     Surgical History: Past Surgical History:  Procedure Laterality Date  . CHOLECYSTECTOMY N/A 12/25/2016   Procedure: LAPAROSCOPIC CHOLECYSTECTOMY  WITH INTRAOPERATIVE CHOLANGIOGRAM;  Surgeon: Jules Husbands, MD;  Location: [facility] ORS;  Service: General;  Laterality: N/A;  . COLONOSCOPY WITH PROPOFOL N/A 09/09/2017   Procedure: COLONOSCOPY WITH PROPOFOL;  Surgeon: [name], MD;  Location: [facility] ENDOSCOPY;  Service: Gastroenterology;  Laterality: N/A;  . CYSTOSCOPY WITH LITHOLAPAXY N/A 07/16/2017   Procedure: CYSTOSCOPY WITH LITHOLAPAXY;  Surgeon: Hollice Espy, MD;  Location: [facility] ORS;  Service: Urology;  Laterality: N/A;  . ERCP N/A 11/28/2016   Procedure: ENDOSCOPIC RETROGRADE CHOLANGIOPANCREATOGRAPHY (ERCP);  Surgeon: [name], MD;  Location: [facility] ENDOSCOPY;  Service: Endoscopy;  Laterality: N/A;  . ESOPHAGOGASTRODUODENOSCOPY (EGD) WITH PROPOFOL N/A 09/09/2017   Procedure: ESOPHAGOGASTRODUODENOSCOPY (EGD) WITH PROPOFOL;  Surgeon: [name], MD;  Location: [facility] ENDOSCOPY;  Service: Gastroenterology;  Laterality: N/A;  . ESOPHAGOGASTRODUODENOSCOPY (EGD) WITH PROPOFOL N/A 12/01/2017   Procedure: ESOPHAGOGASTRODUODENOSCOPY (EGD) WITH PROPOFOL;  Surgeon: [name], MD;  Location: [facility] ENDOSCOPY;  Service: Gastroenterology;  Laterality: N/A;  . ESOPHAGOGASTRODUODENOSCOPY (EGD) WITH PROPOFOL N/A 02/09/2018   Procedure: ESOPHAGOGASTRODUODENOSCOPY (EGD) WITH PROPOFOL;  Surgeon: [name], MD;  Location: [facility] ENDOSCOPY;  Service: Gastroenterology;  Laterality: N/A;  . GIVENS CAPSULE STUDY N/A 12/01/2017   Procedure: GIVENS CAPSULE STUDY;  Surgeon: [name], MD;  Location: [facility] ENDOSCOPY;  Service: Gastroenterology;  Laterality: N/A;  . HOLEP-LASER ENUCLEATION OF THE PROSTATE WITH MORCELLATION N/A 07/11/2015   Procedure: HOLEP-LASER ENUCLEATION OF THE PROSTATE WITH MORCELLATION;  Surgeon: Hollice Espy, MD;  Location: [facility] ORS;  Service: Urology;  Laterality: N/A;  . TRANSURETHRAL RESECTION OF PROSTATE N/A 07/16/2017   Procedure: TRANSURETHRAL RESECTION OF THE PROSTATE (TURP);  Surgeon: Hollice Espy, MD;  Location: [facility] ORS;  Service: Urology;   Laterality: N/A;  . UMBILICAL HERNIA REPAIR  12/25/2016   Procedure: HERNIA REPAIR UMBILICAL ADULT;  Surgeon: Jules Husbands, MD;  Location: [facility] ORS;  Service: General;;    Home Medications:  Allergies as of 04/14/2019   No Known Allergies     Medication List       Accurate as of April 14, 2019 11:59 PM. If you have any questions, ask your nurse or doctor.        Accu-Chek Aviva Plus test strip Generic drug: glucose blood   Accu-Chek Softclix Lancets lancets   amLODipine 10 MG tablet Commonly known as: NORVASC Take 10 mg by mouth daily.   cetirizine 10 MG tablet Commonly known as: ZYRTEC Take 10 mg by mouth daily.   citalopram 10 MG tablet Commonly known as: CELEXA Take 10 mg by mouth daily.   dicyclomine 10 MG capsule Commonly known as: BENTYL   docusate sodium 100 MG capsule Commonly known as: COLACE Take 100 mg by mouth 2 (two) times daily.   fluticasone 50 MCG/ACT nasal spray Commonly known as: FLONASE Place 2 sprays into both nostrils daily.   hydrALAZINE 25 MG tablet Commonly known as: APRESOLINE   hydrochlorothiazide 25 MG tablet Commonly known as: HYDRODIURIL Take 25 mg by mouth daily.   linaclotide 290 MCG Caps capsule Commonly known as: Linzess Take 1 capsule (290 mcg total) by mouth daily before breakfast.   Linzess 290 MCG Caps capsule Generic drug: linaclotide Take 290 mcg by mouth daily.   lisinopril 40 MG tablet Commonly known as: ZESTRIL Take 40 mg by mouth daily.   lovastatin 20 MG tablet Commonly known as: MEVACOR Take 20 mg by mouth at bedtime.   metFORMIN 500 MG tablet Commonly known as: GLUCOPHAGE Take 500 mg by mouth 2 (two) times daily.   metoprolol tartrate 100 MG tablet Commonly known as: LOPRESSOR Take 100 mg by mouth 2 (two) times daily.   Multi-Vitamin tablet Take by mouth.   multivitamin with minerals Tabs tablet Take 1 tablet by mouth daily. Centrum Silver Multivitamin   nystatin cream Commonly known  as: MYCOSTATIN   omeprazole 40 MG capsule Commonly known as: PRILOSEC Take 1 capsule (40 mg total) by mouth daily.   potassium chloride 10 MEQ tablet Commonly known as: KLOR-CON Take 10 mEq by mouth daily.   tamsulosin 0.4 MG Caps capsule Commonly known as: FLOMAX   Tylenol 8 Hour Arthritis Pain 650 MG CR tablet Generic drug: acetaminophen Take 650 mg by mouth every 8 (eight) hours as needed for pain.       Allergies: No Known Allergies  Family History: Family History  Problem Relation Age of Onset  . Cancer Mother        blood stream  . High blood pressure Mother   . Hypertension Sister   . Anemia Sister   . Prostate cancer Neg Hx   . Bladder Cancer Neg Hx   . Kidney disease Neg Hx     Social History:  reports that he quit smoking about 24 years ago. His smoking use included cigarettes. He has never used smokeless tobacco. He reports that he does not drink alcohol or use drugs.  ROS: UROLOGY Frequent Urination?: Yes Hard to postpone urination?: Yes Burning/pain with urination?: Yes Get up at night to urinate?: Yes Leakage of  urine?: No Urine stream starts and stops?: Yes Trouble starting stream?: Yes Do you have to strain to urinate?: Yes Blood in urine?: No Urinary tract infection?: No Sexually transmitted disease?: No Injury to kidneys or bladder?: No Painful intercourse?: No Weak stream?: No Erection problems?: No Penile pain?: No  Gastrointestinal Nausea?: No Vomiting?: No Indigestion/heartburn?: No Diarrhea?: No Constipation?: No  Constitutional Fever: No Night sweats?: No Weight loss?: No Fatigue?: No  Skin Skin rash/lesions?: No Itching?: No  Eyes Blurred vision?: No Double vision?: No  Ears/Nose/Throat Sore throat?: No Sinus problems?: No  Hematologic/Lymphatic Swollen glands?: No Easy bruising?: No  Cardiovascular Leg swelling?: No Chest pain?: No  Respiratory Cough?: No Shortness of breath?:  No  Endocrine Excessive thirst?: No  Musculoskeletal Back pain?: Yes(Right flank) Joint pain?: No  Neurological Headaches?: No Dizziness?: No  Psychologic Depression?: No Anxiety?: No  Physical Exam: BP (!) 185/92   Pulse 71   Ht 5\' 8"  (1.727 m)   Wt 204 lb (92.5 kg)   BMI 31.02 kg/m   Constitutional:  Alert and oriented, No acute distress. HEENT: [place] AT, moist mucus membranes.  Trachea midline, no masses. Cardiovascular: No clubbing, cyanosis, or edema. Respiratory: Normal respiratory effort, no increased work of breathing. Skin: No rashes, bruises or suspicious lesions. Neurologic: Grossly intact, no focal deficits, moving all 4 extremities. Psychiatric: Normal mood and affect.  Laboratory Data: Lab Results  Component Value Date   WBC 5.8 11/12/2018   HGB 16.1 11/12/2018   HCT 46.0 11/12/2018   MCV 87 11/12/2018   PLT 196 11/12/2018    Lab Results  Component Value Date   CREATININE 1.30 (H) 01/13/2018    Pertinent Imaging: Results for orders placed during the hospital encounter of 04/12/19  Abdomen 1 view (KUB)   Narrative CLINICAL DATA:  Back pain on the right  EXAM: ABDOMEN - 1 VIEW  COMPARISON:  None.  FINDINGS: Scattered large and small bowel gas is noted. The right kidney is obscured by overlying bowel content. No definitive renal or ureteral stones are seen. Degenerative changes of the lumbar spine are noted.  IMPRESSION: No definitive renal stones are seen. The right kidney is obscured by overlying bowel content.   Electronically Signed   By: Inez Catalina M.D.   On: 04/12/2019 15:19     Results for orders placed during the hospital encounter of 04/12/19  US RENAL   Narrative CLINICAL DATA:  Oxaluria. Flank pain.  EXAM: RENAL / URINARY TRACT ULTRASOUND COMPLETE  COMPARISON:  None.  FINDINGS: Right Kidney:  Renal measurements: 11.5 x 5.1 x 6.1 cm = volume: 185 mL. 13 mm cyst in the right kidney. 6.5 mm stone in the lower pole  of the right kidney.  Left Kidney:  Renal measurements: 11.9 x 5.3 x 5.2 cm = volume: 172 mL. 3.6 cm cyst in the upper pole the left kidney. No stones.  Bladder:  Appears normal for degree of bladder distention.  Other:  None.  IMPRESSION: 1. Probable nonshadowing 6.5 mm stone in the lower pole the right kidney. No hydronephrosis bilaterally. Bilateral renal cysts.   Electronically Signed   By: Dorise Bullion III M.D   On: 04/12/2019 14:55    KUB today is unremarkable on personal review.  Renal ultrasound does show opacification in the pole.   Assessment & Plan:    1. Dysuria Resolved  2. BPH with obstruction/lower urinary tract symptoms Symptoms improved with resumption of Flomax We will continue this medication Reassess symptoms next years  or sooner if they worsen  3. Kidney stones Incidental right lower pole kidney stone.  He is asymptomatic from this.  We discussed that stones in the lower pole which are nonobstructing typically are not painful.  Surgical intervention versus observation were discussed.  I would more strongly recommend observation this time.  He is agreeable this plan.  Plan for KUB to reassess in a year  Return in about 1 year (around 04/13/2020) for KUB, IPSS/ PVR/ UA.  Hollice Espy, MD  [facility], [facility], [facility] [address], [place], [place] [address] [phone]

## 2019-04-20 ENCOUNTER — Other Ambulatory Visit: Payer: Medicare Other

## 2019-04-22 ENCOUNTER — Ambulatory Visit: Payer: Medicare Other | Admitting: Gastroenterology

## 2019-04-22 ENCOUNTER — Ambulatory Visit: Payer: Medicare Other | Admitting: Urology

## 2019-04-23 ENCOUNTER — Telehealth: Payer: Self-pay | Admitting: Gastroenterology

## 2019-04-23 ENCOUNTER — Encounter: Payer: Self-pay | Admitting: Gastroenterology

## 2019-04-23 ENCOUNTER — Ambulatory Visit (INDEPENDENT_AMBULATORY_CARE_PROVIDER_SITE_OTHER): Payer: Medicare Other | Admitting: Gastroenterology

## 2019-04-23 DIAGNOSIS — L290 Pruritus ani: Secondary | ICD-10-CM

## 2019-04-23 DIAGNOSIS — K5900 Constipation, unspecified: Secondary | ICD-10-CM | POA: Diagnosis not present

## 2019-04-23 NOTE — Progress Notes (Signed)
Brandon Benjamin , MD [address]  [place]  [place], [place] [address]  Main: [phone]  Fax: [phone]   Primary Care Physician: Letta Median, MD  Virtual Visit via Telephone Note  I connected with patient on 04/23/19 at  8:45 AM EST by telephone and verified that I am speaking with the correct person using two identifiers.   I discussed the limitations, risks, security and privacy concerns of performing an evaluation and management service by telephone and the availability of in person appointments. I also discussed with the patient that there Bi be a patient responsible charge related to this service. The patient expressed understanding and agreed to proceed.  Location of Patient: Home Location of Provider: Home Persons involved: Patient and provider only   History of Present Illness:   Anal pruritus follow up    HPI: Brandon Benjamin is a 74 y.o. male   Summary of history :   Summary of history :  He was initially seen On 08/28/17 foriron deficiency anemia.B12,folate normal   09/09/17- Colonoscopy : 6 tiny polyps x 4 adenomas were excised  EGD - 7 cm hiatal hernia- edematous appearing GE junction. 12/01/2017: EGD: Gastric polyp was seen. About 10 mm in size. Not resected as if it would have bled it would have affected the results of the capsule test. Biopsies were taken. Biopsies were done as of a large fundic gland polyp. Candidiasis of the esophagus also noted. 12/08/2017: Small bowel capsule: Normal study of the small bowel. No abnormalities noted. Iron studies have normalized from 01/12/18 RUQ USG 01/16/18 : simple hepatic cysts of the right hepatic lobe  01/13/18 : CMP,CBC-Normal  02/09/2018 : EGD: large polyp resected in stomach. Fundic gland polyp   Interval history12/28/2020-04/23/2019  Last visit main issue was peri anal itching and constipation. Today says doing better but has times when it isnt , wasn't able to describe  exactly how often and how worse. Discussed better for him to have an office visit and I examine his anal area and step up Rx for constipation       Current Outpatient Medications  Medication Sig Dispense Refill  . ACCU-CHEK AVIVA PLUS test strip     . ACCU-CHEK SOFTCLIX LANCETS lancets     . acetaminophen (TYLENOL 8 HOUR ARTHRITIS PAIN) 650 MG CR tablet Take 650 mg by mouth every 8 (eight) hours as needed for pain.    Marland Kitchen amLODipine (NORVASC) 10 MG tablet Take 10 mg by mouth daily.    . cetirizine (ZYRTEC) 10 MG tablet Take 10 mg by mouth daily.    . citalopram (CELEXA) 10 MG tablet Take 10 mg by mouth daily.    Marland Kitchen dicyclomine (BENTYL) 10 MG capsule     . docusate sodium (COLACE) 100 MG capsule Take 100 mg by mouth 2 (two) times daily.    . fluticasone (FLONASE) 50 MCG/ACT nasal spray Place 2 sprays into both nostrils daily.     . hydrALAZINE (APRESOLINE) 25 MG tablet     . hydrochlorothiazide (HYDRODIURIL) 25 MG tablet Take 25 mg by mouth daily.     Marland Kitchen linaclotide (LINZESS) 290 MCG CAPS capsule Take 1 capsule (290 mcg total) by mouth daily before breakfast. 90 capsule 1  . LINZESS 290 MCG CAPS capsule Take 290 mcg by mouth daily.     Marland Kitchen lisinopril (PRINIVIL,ZESTRIL) 40 MG tablet Take 40 mg by mouth daily.    Marland Kitchen lovastatin (MEVACOR) 20 MG tablet Take 20 mg by mouth  at bedtime.    . metFORMIN (GLUCOPHAGE) 500 MG tablet Take 500 mg by mouth 2 (two) times daily.     . metoprolol (LOPRESSOR) 100 MG tablet Take 100 mg by mouth 2 (two) times daily.     . Multiple Vitamin (MULTI-VITAMIN) tablet Take by mouth.    . Multiple Vitamin (MULTIVITAMIN WITH MINERALS) TABS tablet Take 1 tablet by mouth daily. Centrum Silver Multivitamin    . nystatin cream (MYCOSTATIN)     . omeprazole (PRILOSEC) 40 MG capsule Take 1 capsule (40 mg total) by mouth daily. 90 capsule 3  . potassium chloride (K-DUR) 10 MEQ tablet Take 10 mEq by mouth daily.     . tamsulosin (FLOMAX) 0.4 MG CAPS capsule      No current  facility-administered medications for this visit.    Allergies as of 04/23/2019  . (No Known Allergies)    Review of Systems:    All systems reviewed and negative except where noted in HPI.   Observations/Objective:  Labs: CMP     Component Value Date/Time   NA 145 (H) 01/13/2018 1401   K 3.9 01/13/2018 1401   CL 104 01/13/2018 1401   CO2 27 01/13/2018 1401   GLUCOSE 99 01/13/2018 1401   GLUCOSE 137 (H) 07/17/2017 0455   BUN 15 01/13/2018 1401   CREATININE 1.30 (H) 01/13/2018 1401   CALCIUM 10.6 (H) 01/13/2018 1401   PROT 6.6 01/13/2018 1401   ALBUMIN 4.4 01/13/2018 1401   AST 17 01/13/2018 1401   ALT 23 01/13/2018 1401   ALKPHOS 68 01/13/2018 1401   BILITOT 0.4 01/13/2018 1401   GFRNONAA 55 (L) 01/13/2018 1401   GFRAA 64 01/13/2018 1401   Lab Results  Component Value Date   WBC 5.8 11/12/2018   HGB 16.1 11/12/2018   HCT 46.0 11/12/2018   MCV 87 11/12/2018   PLT 196 11/12/2018    Imaging Studies: Abdomen 1 view (KUB)  Result Date: 04/12/2019 CLINICAL DATA:  Back pain on the right EXAM: ABDOMEN - 1 VIEW COMPARISON:  None. FINDINGS: Scattered large and small bowel gas is noted. The right kidney is obscured by overlying bowel content. No definitive renal or ureteral stones are seen. Degenerative changes of the lumbar spine are noted. IMPRESSION: No definitive renal stones are seen. The right kidney is obscured by overlying bowel content. Electronically Signed   By: Inez Catalina M.D.   On: 04/12/2019 15:19   US RENAL  Result Date: 04/12/2019 CLINICAL DATA:  Oxaluria. Flank pain. EXAM: RENAL / URINARY TRACT ULTRASOUND COMPLETE COMPARISON:  None. FINDINGS: Right Kidney: Renal measurements: 11.5 x 5.1 x 6.1 cm = volume: 185 mL. 13 mm cyst in the right kidney. 6.5 mm stone in the lower pole of the right kidney. Left Kidney: Renal measurements: 11.9 x 5.3 x 5.2 cm = volume: 172 mL. 3.6 cm cyst in the upper pole the left kidney. No stones. Bladder: Appears normal for degree of  bladder distention. Other: None. IMPRESSION: 1. Probable nonshadowing 6.5 mm stone in the lower pole the right kidney. No hydronephrosis bilaterally. Bilateral renal cysts. Electronically Signed   By: Dorise Bullion III M.D   On: 04/12/2019 14:55    Assessment and Plan:   Brandon Benjamin is a 74 y.o. y/o male  here to follow up for constipation and peri anal itching : Since last visit not significantly better  Plan  1. Office visit to discuss further+/- banding of hemorrhoids     I discussed the assessment and  treatment plan with the patient. The patient was provided an opportunity to ask questions and all were answered. The patient agreed with the plan and demonstrated an understanding of the instructions.   The patient was advised to call back or seek an in-person evaluation if the symptoms worsen or if the condition fails to improve as anticipated.  I provided 8 minutes of non-face-to-face time during this encounter.  Dr Brandon Bellows MD,MRCP [facility]) Gastroenterology/Hepatology Pager: [phone]   Speech recognition software was used to dictate this note.

## 2019-04-23 NOTE — Telephone Encounter (Signed)
Left vm to offer 6 week f/u in office with Dr. Vicente Males

## 2019-05-24 ENCOUNTER — Ambulatory Visit (INDEPENDENT_AMBULATORY_CARE_PROVIDER_SITE_OTHER): Payer: Medicare Other | Admitting: Podiatry

## 2019-05-24 ENCOUNTER — Encounter: Payer: Self-pay | Admitting: Podiatry

## 2019-05-24 ENCOUNTER — Other Ambulatory Visit: Payer: Self-pay

## 2019-05-24 DIAGNOSIS — M79676 Pain in unspecified toe(s): Secondary | ICD-10-CM

## 2019-05-24 DIAGNOSIS — B351 Tinea unguium: Secondary | ICD-10-CM | POA: Diagnosis not present

## 2019-05-24 DIAGNOSIS — E119 Type 2 diabetes mellitus without complications: Secondary | ICD-10-CM

## 2019-05-24 NOTE — Progress Notes (Addendum)
Complaint:  Visit Type: Patient returns to my office for continued preventative foot care services. Complaint: Patient states" my nails have grown long and thick and become painful to walk and wear shoes" Patient has been diagnosed with DM with no foot complications. The patient presents for preventative foot care services.   Podiatric Exam: Vascular: dorsalis pedis and posterior tibial pulses are palpable bilateral. Capillary return is immediate. Temperature gradient is WNL. Skin turgor WNL  Sensorium: Normal Semmes Weinstein monofilament test. Normal tactile sensation bilaterally. Nail Exam: Pt has thick disfigured discolored nails with subungual debris noted bilateral entire nail hallux through fifth toenails Ulcer Exam: There is no evidence of ulcer or pre-ulcerative changes or infection. Orthopedic Exam: Muscle tone and strength are WNL. No limitations in general ROM. No crepitus or effusions noted. Foot type and digits show no abnormalities. Bony prominences are unremarkable. Skin: No Porokeratosis. No infection or ulcers  Diagnosis:  Onychomycosis, , Pain in right toe, pain in left toes  Treatment & Plan Procedures and Treatment: Consent by patient was obtained for treatment procedures. The patient understood the discussion of treatment and procedures well. All questions were answered thoroughly reviewed. Debridement of mycotic and hypertrophic toenails, 1 through 5 bilateral and clearing of subungual debris. No ulceration, no infection noted.  Visual inspection of the feet was performed. Return Visit-Office Procedure: Patient instructed to return to the office for a follow up visit 3 months for continued evaluation and treatment.    Gardiner Barefoot DPM

## 2019-06-09 ENCOUNTER — Ambulatory Visit (INDEPENDENT_AMBULATORY_CARE_PROVIDER_SITE_OTHER): Payer: Medicare Other | Admitting: Gastroenterology

## 2019-06-09 ENCOUNTER — Other Ambulatory Visit: Payer: Self-pay

## 2019-06-09 VITALS — BP 152/89 | HR 59 | Temp 97.9°F | Ht 68.0 in | Wt 204.2 lb

## 2019-06-09 DIAGNOSIS — L290 Pruritus ani: Secondary | ICD-10-CM | POA: Diagnosis not present

## 2019-06-09 DIAGNOSIS — K648 Other hemorrhoids: Secondary | ICD-10-CM | POA: Diagnosis not present

## 2019-06-09 NOTE — Progress Notes (Signed)
[name] MD, MRCP(U.K) [address]  [place]  [place], [place] [address]  Main: [phone]  Fax: [phone]   Primary Care Physician: Letta Median, MD  Primary Gastroenterologist:  Dr. [name]   Anal pruritus follow-up  HPI: Brandon Benjamin is a 74 y.o. male    Summary of history :  He was initially seen On 08/28/17 foriron deficiency anemia.B12,folate normal   09/09/17- Colonoscopy : 6 tiny polyps x 4 adenomas were excised  EGD - 7 cm hiatal hernia- edematous appearing GE junction. 12/01/2017: EGD: Gastric polyp was seen. About 10 mm in size. Not resected as if it would have bled it would have affected the results of the capsule test. Biopsies were taken. Biopsies were done as of a large fundic gland polyp. Candidiasis of the esophagus also noted. 12/08/2017: Small bowel capsule: Normal study of the small bowel. No abnormalities noted. Iron studies have normalized from 01/12/18 RUQ USG 01/16/18 : simple hepatic cysts of the right hepatic lobe  01/13/18 : CMP,CBC-Normal  02/09/2018 : EGD: large polyp resected in stomach. Fundic gland polyp   Interval history1/29/2021-06/09/2019  Last visit main issue was peri anal itching and constipation.  The perianal itching continues.  Constipation has done well after commencing on Linzess.  He has performed all conservative management for hemorrhoids but has not worked.  Discussed with him about banding and he would like to proceed for internal hemorrhoids after endoscopic exam     Current Outpatient Medications  Medication Sig Dispense Refill  . ACCU-CHEK AVIVA PLUS test strip     . ACCU-CHEK SOFTCLIX LANCETS lancets     . acetaminophen (TYLENOL 8 HOUR ARTHRITIS PAIN) 650 MG CR tablet Take 650 mg by mouth every 8 (eight) hours as needed for pain.    Marland Kitchen amLODipine (NORVASC) 10 MG tablet Take 10 mg by mouth daily.    . cetirizine (ZYRTEC) 10 MG tablet Take 10 mg by mouth daily.    . citalopram  (CELEXA) 10 MG tablet Take 10 mg by mouth daily.    Marland Kitchen dicyclomine (BENTYL) 10 MG capsule     . docusate sodium (COLACE) 100 MG capsule Take 100 mg by mouth 2 (two) times daily.    . fluticasone (FLONASE) 50 MCG/ACT nasal spray Place 2 sprays into both nostrils daily.     . hydrALAZINE (APRESOLINE) 25 MG tablet     . hydrochlorothiazide (HYDRODIURIL) 25 MG tablet Take 25 mg by mouth daily.     Marland Kitchen linaclotide (LINZESS) 290 MCG CAPS capsule Take 1 capsule (290 mcg total) by mouth daily before breakfast. 90 capsule 1  . LINZESS 290 MCG CAPS capsule Take 290 mcg by mouth daily.     Marland Kitchen lisinopril (PRINIVIL,ZESTRIL) 40 MG tablet Take 40 mg by mouth daily.    Marland Kitchen lovastatin (MEVACOR) 20 MG tablet Take 20 mg by mouth at bedtime.    . metFORMIN (GLUCOPHAGE) 500 MG tablet Take 500 mg by mouth 2 (two) times daily.     . metoprolol (LOPRESSOR) 100 MG tablet Take 100 mg by mouth 2 (two) times daily.     . Multiple Vitamin (MULTI-VITAMIN) tablet Take by mouth.    . Multiple Vitamin (MULTIVITAMIN WITH MINERALS) TABS tablet Take 1 tablet by mouth daily. Centrum Silver Multivitamin    . nystatin cream (MYCOSTATIN)     . omeprazole (PRILOSEC) 40 MG capsule Take 1 capsule (40 mg total) by mouth daily. 90 capsule 3  . potassium chloride (K-DUR) 10 MEQ tablet  Take 10 mEq by mouth daily.     . potassium chloride (KLOR-CON) 10 MEQ tablet     . tamsulosin (FLOMAX) 0.4 MG CAPS capsule      No current facility-administered medications for this visit.    Allergies as of 06/09/2019  . (No Known Allergies)    ROS:  General: Negative for anorexia, weight loss, fever, chills, fatigue, weakness. ENT: Negative for hoarseness, difficulty swallowing , nasal congestion. CV: Negative for chest pain, angina, palpitations, dyspnea on exertion, peripheral edema.  Respiratory: Negative for dyspnea at rest, dyspnea on exertion, cough, sputum, wheezing.  GI: See history of present illness. GU:  Negative for dysuria, hematuria,  urinary incontinence, urinary frequency, nocturnal urination.  Endo: Negative for unusual weight change.    Physical Examination:   There were no vitals taken for this visit.  General: Well-nourished, well-developed in no acute distress.  Eyes: No icterus. Conjunctivae pink. Psych: Alert and cooperative, normal mood and affect.  PROCEDURE NOTE: The patient presents with symptomatic grade 1 hemorrhoids, unresponsive to maximal medical therapy, requesting rubber band ligation of his/her hemorrhoidal disease.  All risks, benefits and alternative forms of therapy were described and informed consent was obtained.  In the Left Lateral Decubitus position (if anoscopy is performed) anoscopic examination revealed grade 2 hemorrhoids in the all position(s).   The decision was made to band the RA internal hemorrhoid, and the [place] was used to perform band ligation without complication.  Digital anorectal examination was then performed to assure proper positioning of the band, and to adjust the banded tissue as required.  The patient was discharged home without pain or other issues.  Dietary and behavioral recommendations were given and (if necessary - prescriptions were given), along with follow-up instructions.  The patient will return 3 weeks for follow-up and possible additional banding as required.  No complications were encountered and the patient tolerated the procedure well.    Imaging Studies: No results found.  Assessment and Plan:   Brandon Benjamin is a 74 y.o. y/o male  here to follow up for constipation and peri anal itching : Since last visit not significantly better.  I have banded his right anterior column of hemorrhoids successfully.  Repeat banding in 3 weeks time.  P  Dr [name]  MD,MRCP [facility]) Follow up in 3 weeks

## 2019-06-30 ENCOUNTER — Ambulatory Visit (INDEPENDENT_AMBULATORY_CARE_PROVIDER_SITE_OTHER): Payer: Medicare Other | Admitting: Gastroenterology

## 2019-06-30 ENCOUNTER — Other Ambulatory Visit: Payer: Self-pay

## 2019-06-30 VITALS — BP 162/102 | HR 60 | Temp 97.4°F | Ht 68.0 in | Wt 202.8 lb

## 2019-06-30 DIAGNOSIS — K648 Other hemorrhoids: Secondary | ICD-10-CM

## 2019-06-30 DIAGNOSIS — L290 Pruritus ani: Secondary | ICD-10-CM | POA: Diagnosis not present

## 2019-06-30 NOTE — Progress Notes (Signed)
Patient follow-ups today for banding of hemorrhoids    Summary of history :   Last visit on 06/09/2019 main issue was peri anal itching and constipation.   Constipation has done well after commencing on Linzess.  He has performed all conservative management for hemorrhoids but has not worked.  Discussed with him about banding and he would like to proceed for internal hemorrhoids after endoscopic exam   First round: Right anterior hemorrhoid banded  Interval history 06/09/2019-06/30/2019  Perianal itching has significantly decreased after banding of the hemorrhoid last time.    Digital rectal exam performed in the presence of a chaperone. External anal findings: Normal Internal findings: On anoscopy right posterior hemorrhoid column noted., No masses, no blood on glove noticed.    PROCEDURE NOTE: The patient presents with symptomatic grade 1 hemorrhoids, unresponsive to maximal medical therapy, requesting rubber band ligation of his/her hemorrhoidal disease.  All risks, benefits and alternative forms of therapy were described and informed consent was obtained.  In the Left Lateral Decubitus position (if anoscopy is performed) anoscopic examination revealed grade 2 hemorrhoids in the RP and LL position(s).   The decision was made to band the RP internal hemorrhoid, and the [place] was used to perform band ligation without complication.  Digital anorectal examination was then performed to assure proper positioning of the band, and to adjust the banded tissue as required.  The patient was discharged home without pain or other issues.  Dietary and behavioral recommendations were given and (if necessary - prescriptions were given), along with follow-up instructions.  The patient will return 3 weeks for follow-up and possible additional banding as required.  No complications were encountered and the patient tolerated the procedure well.   Plan:  1. Avoid constipation.  Commence on  stool softeners if not already on  Follow-up: 3 weeks  Dr [name] MD,MRCP [facility]) Gastroenterology/Hepatology Pager: [phone]

## 2019-07-26 ENCOUNTER — Ambulatory Visit (INDEPENDENT_AMBULATORY_CARE_PROVIDER_SITE_OTHER): Payer: Medicare Other | Admitting: Gastroenterology

## 2019-07-26 VITALS — BP 160/89 | HR 62 | Temp 97.6°F | Ht 68.0 in | Wt 205.6 lb

## 2019-07-26 DIAGNOSIS — K648 Other hemorrhoids: Secondary | ICD-10-CM

## 2019-07-26 NOTE — Progress Notes (Signed)
Patient follow-ups today for banding of hemorrhoids    Summary of history : Last visit on 06/09/2019 main issue was peri anal itching and constipation. Constipation has done well after commencing on Linzess. He has performed all conservative management for hemorrhoids but has not worked. Discussed with him about banding and he would like to proceed for internal hemorrhoids after endoscopic exam   First round: Right anterior hemorrhoid banded  Second round: Posterior hemorrhoid banded   Interval history   06/30/2019-07/26/2019    Digital rectal exam performed in the presence of a chaperone. External anal findings: No abnormalities. Internal findings: No masses, no blood on glove noticed.    PROCEDURE NOTE: The patient presents with symptomatic grade 1 hemorrhoids, unresponsive to maximal medical therapy, requesting rubber band ligation of his/her hemorrhoidal disease.  All risks, benefits and alternative forms of therapy were described and informed consent was obtained.  In the Left Lateral Decubitus position (if anoscopy is performed) anoscopic examination revealed grade 2 hemorrhoids in the LL position(s).   The decision was made to band the LL internal hemorrhoid, and the [place] was used to perform band ligation without complication.  Digital anorectal examination was then performed to assure proper positioning of the band, and to adjust the banded tissue as required.  The patient was discharged home without pain or other issues.  Dietary and behavioral recommendations were given and (if necessary - prescriptions were given), along with follow-up instructions.  The patient will return As needed as needed for follow-up and possible additional banding as required.  No complications were encountered and the patient tolerated the procedure well.   Plan:  1. Avoid constipation.  Commence on stool softeners if not already on  Follow-up: As needed  Dr [name]  MD,MRCP [facility]) Gastroenterology/Hepatology Pager: [phone]

## 2019-08-26 ENCOUNTER — Ambulatory Visit: Payer: Medicare Other | Admitting: Podiatry

## 2019-08-26 ENCOUNTER — Ambulatory Visit (INDEPENDENT_AMBULATORY_CARE_PROVIDER_SITE_OTHER): Payer: Medicare Other | Admitting: Podiatry

## 2019-08-26 ENCOUNTER — Other Ambulatory Visit: Payer: Self-pay

## 2019-08-26 ENCOUNTER — Encounter: Payer: Self-pay | Admitting: Podiatry

## 2019-08-26 DIAGNOSIS — E119 Type 2 diabetes mellitus without complications: Secondary | ICD-10-CM

## 2019-08-26 DIAGNOSIS — B351 Tinea unguium: Secondary | ICD-10-CM | POA: Diagnosis not present

## 2019-08-26 DIAGNOSIS — M79676 Pain in unspecified toe(s): Secondary | ICD-10-CM | POA: Diagnosis not present

## 2019-08-26 NOTE — Progress Notes (Signed)
This patient returns to my office for at risk foot care.  This patient requires this care by a professional since this patient will be at risk due to having  diabetes.  This patient is unable to cut nails himself since the patient cannot reach his nails.These nails are painful walking and wearing shoes.  This patient presents for at risk foot care today.  General Appearance  Alert, conversant and in no acute stress.  Vascular  Dorsalis pedis and posterior tibial  pulses are palpable  bilaterally.  Capillary return is within normal limits  bilaterally. Temperature is within normal limits  bilaterally.  Neurologic  Senn-Weinstein monofilament wire test within normal limits  bilaterally. Muscle power within normal limits bilaterally.  Nails Thick disfigured discolored nails with subungual debris  from hallux to fifth toes bilaterally. No evidence of bacterial infection or drainage bilaterally.  Orthopedic  No limitations of motion  feet .  No crepitus or effusions noted.  No bony pathology or digital deformities noted.  Skin  normotropic skin with no porokeratosis noted bilaterally.  No signs of infections or ulcers noted.     Onychomycosis  Pain in right toes  Pain in left toes  Consent was obtained for treatment procedures.   Mechanical debridement of nails 1-5  bilaterally performed with a nail nipper.  Filed with dremel without incident.    Return office visit   3 months                   Told patient to return for periodic foot care and evaluation due to potential at risk complications.   Carlinda Ohlson DPM   

## 2019-12-01 ENCOUNTER — Other Ambulatory Visit: Payer: Self-pay

## 2019-12-01 ENCOUNTER — Ambulatory Visit (INDEPENDENT_AMBULATORY_CARE_PROVIDER_SITE_OTHER): Payer: Medicare Other | Admitting: Surgery

## 2019-12-01 ENCOUNTER — Encounter: Payer: Self-pay | Admitting: Surgery

## 2019-12-01 ENCOUNTER — Other Ambulatory Visit
Admission: RE | Admit: 2019-12-01 | Discharge: 2019-12-01 | Disposition: A | Discharge status: Home / Self Care | Payer: Medicare Other | Source: Ambulatory Visit | Attending: Surgery | Admitting: Surgery

## 2019-12-01 VITALS — BP 155/91 | HR 69 | Temp 97.9°F | Resp 14 | Ht 68.0 in | Wt 208.0 lb

## 2019-12-01 DIAGNOSIS — R1031 Right lower quadrant pain: Secondary | ICD-10-CM | POA: Diagnosis not present

## 2019-12-01 LAB — CREATININE, SERUM
Creatinine, Ser: 0.98 mg/dL (ref 0.61–1.24)
GFR calc Af Amer: >60 mL/min (ref >60)
GFR calc non Af Amer: >60 mL/min (ref >60)

## 2019-12-01 NOTE — Patient Instructions (Addendum)
CT scheduled  12/06/19 @ [facility] @ 9:45 am. Nothing to eat/drink 4 hours prior.   You will need to go to the Lab today to have blood work drawn. Please go to the [place] entrance of [facility] and go to the lab.   You will need to pick up your prep kit and instructions today at Outpatient Imaging.  Please see your appointment listed below.

## 2019-12-01 NOTE — Progress Notes (Signed)
Outpatient Surgical Follow Up  12/01/2019  Brandon Benjamin is an 74 y.o. male.   Chief Complaint  Patient presents with   Follow-up    right abdominal pain- lap chole 12/2016    HPI: Mr. Brandon Benjamin is a 74 year old male with a prior history of laparoscopic cholecystectomy done by me close to 3 years ago now presents with a 1 to 61-month history of right upper quadrant pain.  The pain is sharp intermittent moderate intensity and seems to be worsening with certain positions.  He does have associated nausea but no vomiting.  He tolerates diet well.  Normal bowel movements.  No hematochezia no hematemesis no melena.  No weight loss. He has had upper and lower scopes that I have personally reviewed the images.  There is evidence of a large hiatal hernia.  Colonoscopy showed 6 polyps that were excised.  These studies were done 2019.  He also underwent small bowel capsule study that was normal.  He does have some intermittent constipation and had a recent hemorrhoidal banding by Dr. Vicente Males.  Past Medical History:  Diagnosis Date   Anemia    Anxiety    Balanitis    Balanitis    BPH (benign prostatic hyperplasia)    Diabetes (HCC)    Erectile dysfunction    GERD (gastroesophageal reflux disease)    History of kidney stones    HLD (hyperlipidemia)    HTN (hypertension)    Iron deficiency anemia due to chronic blood loss 09/04/2017   Over weight    Priapism    Scrotal cyst    Urinary urgency     Past Surgical History:  Procedure Laterality Date   CHOLECYSTECTOMY N/A 12/25/2016   Procedure: LAPAROSCOPIC CHOLECYSTECTOMY WITH INTRAOPERATIVE CHOLANGIOGRAM;  Surgeon: Jules Husbands, MD;  Location: [facility] ORS;  Service: General;  Laterality: N/A;   COLONOSCOPY WITH PROPOFOL N/A 09/09/2017   Procedure: COLONOSCOPY WITH PROPOFOL;  Surgeon: [name], MD;  Location: [facility] ENDOSCOPY;  Service: Gastroenterology;  Laterality: N/A;   CYSTOSCOPY WITH LITHOLAPAXY N/A 07/16/2017   Procedure:  CYSTOSCOPY WITH LITHOLAPAXY;  Surgeon: Hollice Espy, MD;  Location: [facility] ORS;  Service: Urology;  Laterality: N/A;   ERCP N/A 11/28/2016   Procedure: ENDOSCOPIC RETROGRADE CHOLANGIOPANCREATOGRAPHY (ERCP);  Surgeon: [name], MD;  Location: [facility] ENDOSCOPY;  Service: Endoscopy;  Laterality: N/A;   ESOPHAGOGASTRODUODENOSCOPY (EGD) WITH PROPOFOL N/A 09/09/2017   Procedure: ESOPHAGOGASTRODUODENOSCOPY (EGD) WITH PROPOFOL;  Surgeon: [name], MD;  Location: [facility] ENDOSCOPY;  Service: Gastroenterology;  Laterality: N/A;   ESOPHAGOGASTRODUODENOSCOPY (EGD) WITH PROPOFOL N/A 12/01/2017   Procedure: ESOPHAGOGASTRODUODENOSCOPY (EGD) WITH PROPOFOL;  Surgeon: [name], MD;  Location: [facility] ENDOSCOPY;  Service: Gastroenterology;  Laterality: N/A;   ESOPHAGOGASTRODUODENOSCOPY (EGD) WITH PROPOFOL N/A 02/09/2018   Procedure: ESOPHAGOGASTRODUODENOSCOPY (EGD) WITH PROPOFOL;  Surgeon: [name], MD;  Location: [facility] ENDOSCOPY;  Service: Gastroenterology;  Laterality: N/A;   GIVENS CAPSULE STUDY N/A 12/01/2017   Procedure: GIVENS CAPSULE STUDY;  Surgeon: [name], MD;  Location: [facility] ENDOSCOPY;  Service: Gastroenterology;  Laterality: N/A;   HOLEP-LASER ENUCLEATION OF THE PROSTATE WITH MORCELLATION N/A 07/11/2015   Procedure: HOLEP-LASER ENUCLEATION OF THE PROSTATE WITH MORCELLATION;  Surgeon: Hollice Espy, MD;  Location: [facility] ORS;  Service: Urology;  Laterality: N/A;   TRANSURETHRAL RESECTION OF PROSTATE N/A 07/16/2017   Procedure: TRANSURETHRAL RESECTION OF THE PROSTATE (TURP);  Surgeon: Hollice Espy, MD;  Location: [facility] ORS;  Service: Urology;  Laterality: N/A;   UMBILICAL HERNIA REPAIR  12/25/2016   Procedure: HERNIA REPAIR UMBILICAL ADULT;  Surgeon: Jules Husbands, MD;  Location: [facility] ORS;  Service: General;;    Family History  Problem Relation Age of Onset   Cancer Mother        blood stream   High blood pressure Mother    Hypertension Sister    Anemia Sister    Prostate cancer Neg Hx     Bladder Cancer Neg Hx    Kidney disease Neg Hx     Social History:  reports that he quit smoking about 24 years ago. His smoking use included cigarettes. He has never used smokeless tobacco. He reports that he does not drink alcohol and does not use drugs.  Allergies: No Known Allergies  Medications reviewed.    ROS Full ROS performed and is otherwise negative other than what is stated in HPI   BP (!) 155/91    Pulse 69    Temp 97.9 F (36.6 C) (Oral)    Resp 14    Ht 5\' 8"  (1.727 m)    Wt 208 lb (94.3 kg)    SpO2 97%    BMI 31.63 kg/m   Physical Exam Vitals and nursing note reviewed. Exam conducted with a chaperone present.  Constitutional:      General: He is not in acute distress.    Appearance: Normal appearance. He is normal weight. He is not toxic-appearing.  Eyes:     General: No scleral icterus.       Right eye: No discharge.        Left eye: No discharge.  Cardiovascular:     Rate and Rhythm: Normal rate and regular rhythm.     Pulses: Normal pulses.     Heart sounds: No murmur heard.  No friction rub.  Pulmonary:     Effort: Pulmonary effort is normal.     Breath sounds: Normal breath sounds.  Abdominal:     General: Abdomen is flat. There is no distension.     Palpations: Abdomen is soft. There is no mass.     Tenderness: There is no abdominal tenderness. There is no guarding.     Hernia: No hernia is present.  Musculoskeletal:        General: No swelling or tenderness. Normal range of motion.     Cervical back: Normal range of motion and neck supple. No rigidity or tenderness.  Lymphadenopathy:     Cervical: No cervical adenopathy.  Skin:    General: Skin is warm and dry.     Capillary Refill: Capillary refill takes less than 2 seconds.  Neurological:     General: No focal deficit present.     Mental Status: He is alert and oriented to person, place, and time.  Psychiatric:        Mood and Affect: Mood normal.        Behavior: Behavior normal.         Thought Content: Thought content normal.        Judgment: Judgment normal.     Assessment/Plan: 74 year old male status post cholecystectomy 3 years ago now with right upper quadrant pain.  It is not classical for any specific GI pathology.  We will start work-up with a CT scan of the abdomen and pelvis.  It would be unusual for a hiatal hernia to have these symptoms.  This might be related to musculoskeletal in origin.  Currently there is no evidence of hernias or any definitive mechanical anatomical defects.  We will see him after he completes appropriate work-up.  No need for emergent surgical intervention at this time   Greater than 50% of the 50 minutes  visit was spent in counseling/coordination of care   Caroleen Hamman, MD [place] [facility]

## 2019-12-02 ENCOUNTER — Ambulatory Visit (INDEPENDENT_AMBULATORY_CARE_PROVIDER_SITE_OTHER): Payer: Medicare Other | Admitting: Podiatry

## 2019-12-02 ENCOUNTER — Encounter: Payer: Self-pay | Admitting: Podiatry

## 2019-12-02 ENCOUNTER — Other Ambulatory Visit: Payer: Self-pay

## 2019-12-02 DIAGNOSIS — M79676 Pain in unspecified toe(s): Secondary | ICD-10-CM

## 2019-12-02 DIAGNOSIS — B351 Tinea unguium: Secondary | ICD-10-CM

## 2019-12-02 DIAGNOSIS — E119 Type 2 diabetes mellitus without complications: Secondary | ICD-10-CM

## 2019-12-02 DIAGNOSIS — N189 Chronic kidney disease, unspecified: Secondary | ICD-10-CM | POA: Diagnosis not present

## 2019-12-02 NOTE — Progress Notes (Signed)
This patient returns to my office for at risk foot care.  This patient requires this care by a professional since this patient will be at risk due to having diabetes and CKD.  This patient is unable to cut nails himself since the patient cannot reach his nails.These nails are painful walking and wearing shoes.  This patient presents for at risk foot care today.  General Appearance  Alert, conversant and in no acute stress.  Vascular  Dorsalis pedis and posterior tibial  pulses are palpable  bilaterally.  Capillary return is within normal limits  bilaterally. Temperature is within normal limits  bilaterally.  Neurologic  Senn-Weinstein monofilament wire test within normal limits  bilaterally. Muscle power within normal limits bilaterally.  Nails Thick disfigured discolored nails with subungual debris  from hallux to fifth toes bilaterally. No evidence of bacterial infection or drainage bilaterally.  Orthopedic  No limitations of motion  feet .  No crepitus or effusions noted.  No bony pathology or digital deformities noted.  Skin  normotropic skin with no porokeratosis noted bilaterally.  No signs of infections or ulcers noted.     Onychomycosis  Pain in right toes  Pain in left toes  Consent was obtained for treatment procedures.   Mechanical debridement of nails 1-5  bilaterally performed with a nail nipper.  Filed with dremel without incident.    Return office visit   3 months                   Told patient to return for periodic foot care and evaluation due to potential at risk complications.   Abiel Antrim DPM   

## 2019-12-06 ENCOUNTER — Ambulatory Visit
Admission: RE | Admit: 2019-12-06 | Discharge: 2019-12-06 | Disposition: A | Discharge status: Home / Self Care | Payer: Medicare Other | Source: Ambulatory Visit | Attending: Surgery | Admitting: Surgery

## 2019-12-06 ENCOUNTER — Other Ambulatory Visit: Payer: Self-pay

## 2019-12-06 DIAGNOSIS — R1031 Right lower quadrant pain: Secondary | ICD-10-CM

## 2019-12-06 MED ORDER — IOHEXOL 300 MG/ML  SOLN
100.0000 mL | Freq: Once | INTRAMUSCULAR | Status: AC | PRN
  Administered 2019-12-06: 100 mL via INTRAVENOUS

## 2019-12-09 ENCOUNTER — Other Ambulatory Visit: Payer: Self-pay

## 2019-12-09 ENCOUNTER — Telehealth: Payer: Self-pay | Admitting: Surgery

## 2019-12-09 DIAGNOSIS — K449 Diaphragmatic hernia without obstruction or gangrene: Secondary | ICD-10-CM

## 2019-12-09 MED ORDER — ONDANSETRON 4 MG PO TBDP: 4.0000 mg | ORAL_TABLET | Freq: Four times a day (QID) | ORAL | 0 refills | Status: DC | PRN

## 2019-12-09 NOTE — Telephone Encounter (Signed)
Patient is calling and patient is complaining of having pain starting at the abdomen traveling to his legs, pain level is at a 10, and is asking if pain medicine could be called into the pharmacy. Please call patient and advise.

## 2019-12-09 NOTE — Telephone Encounter (Signed)
Per Dr Dahlia Byes he will not send in any prescription pain medications at this time. We can send in a prescription for Zofran to help with the nausea. He Podgorski also do a short course of Ibuprofen in addition to the Tylenol. He Rubano take 2 Extra strength Tylenol, then 4 hours later he Crenshaw take 600 mg Ibuprofen, then continue with this rotation every 4 hours. He will follow up with Dr Dahlia Byes on Monday.

## 2019-12-09 NOTE — Telephone Encounter (Signed)
Reports the pain is on the right side above the belly button. He reports some nausea but no vomiting. He reports that he has taken Tylenol extra strength two and they are not helping.

## 2019-12-10 ENCOUNTER — Telehealth: Payer: Self-pay

## 2019-12-10 NOTE — Telephone Encounter (Signed)
Per Dr Dahlia Byes he would like the patient to have a Barium swallow to better assess his hiatal hernia. He is scheduled for this at [facility] on 12/20/19 at 10 am. He will arrive at the [place] at 9:30 am and have nothing to eat or drink for 3 hours prior.

## 2019-12-13 ENCOUNTER — Other Ambulatory Visit: Payer: Self-pay

## 2019-12-13 ENCOUNTER — Telehealth: Payer: Self-pay | Admitting: Surgery

## 2019-12-13 ENCOUNTER — Encounter: Payer: Self-pay | Admitting: Surgery

## 2019-12-13 ENCOUNTER — Ambulatory Visit (INDEPENDENT_AMBULATORY_CARE_PROVIDER_SITE_OTHER): Payer: Medicare Other | Admitting: Surgery

## 2019-12-13 VITALS — BP 156/98 | HR 58 | Temp 97.8°F | Resp 14 | Ht 69.0 in | Wt 205.0 lb

## 2019-12-13 DIAGNOSIS — K449 Diaphragmatic hernia without obstruction or gangrene: Secondary | ICD-10-CM

## 2019-12-13 MED ORDER — OMEPRAZOLE 40 MG PO CPDR: 40.0000 mg | DELAYED_RELEASE_CAPSULE | Freq: Two times a day (BID) | ORAL | 2 refills | Status: AC

## 2019-12-13 MED ORDER — OMEPRAZOLE 40 MG PO CPDR: 40.0000 mg | DELAYED_RELEASE_CAPSULE | Freq: Two times a day (BID) | ORAL | 2 refills | Status: DC

## 2019-12-13 NOTE — Telephone Encounter (Signed)
Prescription for Omeprazole has been edited and electronically sent to the correct pharmacy which is [facility] on [address] in [place].

## 2019-12-13 NOTE — Telephone Encounter (Signed)
Patient is calling and said omeprazole needs to be sent into [facility] on [address]. Please call patient advise.

## 2019-12-13 NOTE — Patient Instructions (Signed)
Referral sent to Dr.Anna someone from his office will call to schedule an appointment.  Please pick up your medication at the pharmacy today. Please see your follow up appointment listed below.

## 2019-12-14 ENCOUNTER — Telehealth: Payer: Self-pay | Admitting: *Deleted

## 2019-12-14 NOTE — Telephone Encounter (Signed)
Patient called and stated that he was referred to Dr Vicente Males but they can not get him in until December 8th, so he wants to know what he needs to do about getting seen sooner or see someone else. Please call and advise

## 2019-12-14 NOTE — Telephone Encounter (Signed)
I have sent a message to [facility] to see if we can move up his appointment.

## 2019-12-15 ENCOUNTER — Encounter: Payer: Self-pay | Admitting: Surgery

## 2019-12-15 NOTE — Progress Notes (Signed)
Outpatient Surgical Follow Up  12/15/2019  Ja Ohman Valone is an 74 y.o. male.   Chief Complaint  Patient presents with  . Follow-up    abdominal pain    HPI: Mr. Bottomley is a 74 year old male seen in follow-up for reflux and epigastric pain.  He did have a CT scan of the abdomen and pelvis that I have personally reviewed showing evidence of a large hiatal hernia with at least a third of the stomach up in his mediastinum.  He continues to have nausea and intermittent abdominal pain.  He reports that the pain has improved since we started with Zofran.  His nausea has improved as well.  No fevers no chills.  Creatinine was normal.  Past Medical History:  Diagnosis Date  . Anemia   . Anxiety   . Balanitis   . Balanitis   . BPH (benign prostatic hyperplasia)   . Diabetes ([place])   . Erectile dysfunction   . GERD (gastroesophageal reflux disease)   . History of kidney stones   . HLD (hyperlipidemia)   . HTN (hypertension)   . Iron deficiency anemia due to chronic blood loss 09/04/2017  . Over weight   . Priapism   . Scrotal cyst   . Urinary urgency     Past Surgical History:  Procedure Laterality Date  . CHOLECYSTECTOMY N/A 12/25/2016   Procedure: LAPAROSCOPIC CHOLECYSTECTOMY WITH INTRAOPERATIVE CHOLANGIOGRAM;  Surgeon: Jules Husbands, MD;  Location: [facility] ORS;  Service: General;  Laterality: N/A;  . COLONOSCOPY WITH PROPOFOL N/A 09/09/2017   Procedure: COLONOSCOPY WITH PROPOFOL;  Surgeon: [name], MD;  Location: [facility] ENDOSCOPY;  Service: Gastroenterology;  Laterality: N/A;  . CYSTOSCOPY WITH LITHOLAPAXY N/A 07/16/2017   Procedure: CYSTOSCOPY WITH LITHOLAPAXY;  Surgeon: Hollice Espy, MD;  Location: [facility] ORS;  Service: Urology;  Laterality: N/A;  . ERCP N/A 11/28/2016   Procedure: ENDOSCOPIC RETROGRADE CHOLANGIOPANCREATOGRAPHY (ERCP);  Surgeon: [name], MD;  Location: [facility] ENDOSCOPY;  Service: Endoscopy;  Laterality: N/A;  . ESOPHAGOGASTRODUODENOSCOPY (EGD) WITH PROPOFOL N/A  09/09/2017   Procedure: ESOPHAGOGASTRODUODENOSCOPY (EGD) WITH PROPOFOL;  Surgeon: [name], MD;  Location: [facility] ENDOSCOPY;  Service: Gastroenterology;  Laterality: N/A;  . ESOPHAGOGASTRODUODENOSCOPY (EGD) WITH PROPOFOL N/A 12/01/2017   Procedure: ESOPHAGOGASTRODUODENOSCOPY (EGD) WITH PROPOFOL;  Surgeon: [name], MD;  Location: [facility] ENDOSCOPY;  Service: Gastroenterology;  Laterality: N/A;  . ESOPHAGOGASTRODUODENOSCOPY (EGD) WITH PROPOFOL N/A 02/09/2018   Procedure: ESOPHAGOGASTRODUODENOSCOPY (EGD) WITH PROPOFOL;  Surgeon: [name], MD;  Location: [facility] ENDOSCOPY;  Service: Gastroenterology;  Laterality: N/A;  . GIVENS CAPSULE STUDY N/A 12/01/2017   Procedure: GIVENS CAPSULE STUDY;  Surgeon: [name], MD;  Location: [facility] ENDOSCOPY;  Service: Gastroenterology;  Laterality: N/A;  . HOLEP-LASER ENUCLEATION OF THE PROSTATE WITH MORCELLATION N/A 07/11/2015   Procedure: HOLEP-LASER ENUCLEATION OF THE PROSTATE WITH MORCELLATION;  Surgeon: Hollice Espy, MD;  Location: [facility] ORS;  Service: Urology;  Laterality: N/A;  . TRANSURETHRAL RESECTION OF PROSTATE N/A 07/16/2017   Procedure: TRANSURETHRAL RESECTION OF THE PROSTATE (TURP);  Surgeon: Hollice Espy, MD;  Location: [facility] ORS;  Service: Urology;  Laterality: N/A;  . UMBILICAL HERNIA REPAIR  12/25/2016   Procedure: HERNIA REPAIR UMBILICAL ADULT;  Surgeon: Jules Husbands, MD;  Location: [facility] ORS;  Service: General;;    Family History  Problem Relation Age of Onset  . Cancer Mother        blood stream  . High blood pressure Mother   . Hypertension Sister   . Anemia Sister   . Prostate cancer Neg  Hx   . Bladder Cancer Neg Hx   . Kidney disease Neg Hx     Social History:  reports that he quit smoking about 24 years ago. His smoking use included cigarettes. He has never used smokeless tobacco. He reports that he does not drink alcohol and does not use drugs.  Allergies: No Known Allergies  Medications reviewed.    ROS Full ROS performed  and is otherwise negative other than what is stated in HPI   BP (!) 156/98   Pulse (!) 58   Temp 97.8 F (36.6 C) (Oral)   Resp 14   Ht 5\' 9"  (1.753 m)   Wt 205 lb (93 kg)   SpO2 94%   BMI 30.27 kg/m   Physical Exam Vitals and nursing note reviewed. Exam conducted with a chaperone present.  Constitutional:      Appearance: Normal appearance.  Abdominal:     General: Abdomen is flat. There is no distension.     Palpations: Abdomen is soft. There is no mass.     Tenderness: There is no abdominal tenderness. There is no guarding.     Hernia: No hernia is present.  Musculoskeletal:        General: Normal range of motion.     Cervical back: Normal range of motion and neck supple. No rigidity.  Skin:    General: Skin is warm and dry.     Capillary Refill: Capillary refill takes less than 2 seconds.  Neurological:     General: No focal deficit present.     Mental Status: He is alert and oriented to person, place, and time.  Psychiatric:        Mood and Affect: Mood normal.        Behavior: Behavior normal.        Thought Content: Thought content normal.        Judgment: Judgment normal.       Assessment/Plan: 2 large hiatal hernia with significant symptoms.  We will continue to work-up with a barium swallow as well as a referral to GI for an upper endoscopy.  I have prescribed PPIs twice a day in hopes that th this will improve his symptoms.  No need for any surgical intervention at this time. He will likely benefit from elective hiatal hernia repair and fundoplication at some point time but needs to complete preoperative work-up first. Greater than 50% of the 25 minutes  visit was spent in counseling/coordination of care   Caroleen Hamman, MD [place] Surgeon

## 2019-12-16 NOTE — Telephone Encounter (Signed)
Ginger will speak with Dr Allen Norris about getting the patient set up for an upper endoscopy.

## 2019-12-17 ENCOUNTER — Other Ambulatory Visit: Payer: Self-pay

## 2019-12-17 DIAGNOSIS — Z01818 Encounter for other preprocedural examination: Secondary | ICD-10-CM

## 2019-12-17 DIAGNOSIS — K449 Diaphragmatic hernia without obstruction or gangrene: Secondary | ICD-10-CM

## 2019-12-20 ENCOUNTER — Other Ambulatory Visit: Payer: Self-pay | Admitting: Surgery

## 2019-12-20 ENCOUNTER — Other Ambulatory Visit: Payer: Self-pay

## 2019-12-20 ENCOUNTER — Ambulatory Visit
Admission: RE | Admit: 2019-12-20 | Discharge: 2019-12-20 | Disposition: A | Discharge status: Home / Self Care | Payer: Medicare Other | Source: Ambulatory Visit | Attending: Surgery | Admitting: Surgery

## 2019-12-20 DIAGNOSIS — K449 Diaphragmatic hernia without obstruction or gangrene: Secondary | ICD-10-CM | POA: Insufficient documentation

## 2019-12-22 ENCOUNTER — Telehealth: Payer: Self-pay

## 2019-12-22 NOTE — Telephone Encounter (Signed)
Returned patient's call. Patient's wife had questions regarding his procedure that is scheduled for October. I left a voicemail stating instructions were given during appt. I will mail the instructions out to the home address listed.

## 2019-12-22 NOTE — Telephone Encounter (Signed)
Called patient with results of his Barium Swallow. Per Dr Dahlia Byes it shows reflux and a large hiatal hernia. Message left with these results. He will follow up as scheduled. He Messimer call with any questions.

## 2019-12-24 ENCOUNTER — Other Ambulatory Visit: Payer: Self-pay

## 2019-12-24 ENCOUNTER — Other Ambulatory Visit
Admission: RE | Admit: 2019-12-24 | Discharge: 2019-12-24 | Disposition: A | Discharge status: Home / Self Care | Payer: Medicare Other | Source: Ambulatory Visit | Attending: Gastroenterology | Admitting: Gastroenterology

## 2019-12-24 DIAGNOSIS — Z20822 Contact with and (suspected) exposure to covid-19: Secondary | ICD-10-CM | POA: Insufficient documentation

## 2019-12-24 DIAGNOSIS — Z01812 Encounter for preprocedural laboratory examination: Secondary | ICD-10-CM | POA: Diagnosis present

## 2019-12-25 LAB — SARS CORONAVIRUS 2 (TAT 6-24 HRS): SARS Coronavirus 2: NEGATIVE

## 2019-12-28 ENCOUNTER — Ambulatory Visit: Payer: Medicare Other | Admitting: Anesthesiology

## 2019-12-28 ENCOUNTER — Other Ambulatory Visit: Payer: Self-pay

## 2019-12-28 ENCOUNTER — Encounter
Admission: RE | Disposition: A | Discharge status: Home / Self Care | Payer: Self-pay | Source: Home / Self Care | Attending: Gastroenterology

## 2019-12-28 ENCOUNTER — Ambulatory Visit
Admission: RE | Admit: 2019-12-28 | Discharge: 2019-12-28 | Disposition: A | Discharge status: Home / Self Care | Payer: Medicare Other | Attending: Gastroenterology | Admitting: Gastroenterology

## 2019-12-28 ENCOUNTER — Encounter: Payer: Self-pay | Admitting: Gastroenterology

## 2019-12-28 DIAGNOSIS — Z8249 Family history of ischemic heart disease and other diseases of the circulatory system: Secondary | ICD-10-CM | POA: Diagnosis not present

## 2019-12-28 DIAGNOSIS — E119 Type 2 diabetes mellitus without complications: Secondary | ICD-10-CM | POA: Diagnosis not present

## 2019-12-28 DIAGNOSIS — Z9079 Acquired absence of other genital organ(s): Secondary | ICD-10-CM | POA: Insufficient documentation

## 2019-12-28 DIAGNOSIS — Z809 Family history of malignant neoplasm, unspecified: Secondary | ICD-10-CM | POA: Diagnosis not present

## 2019-12-28 DIAGNOSIS — Z7984 Long term (current) use of oral hypoglycemic drugs: Secondary | ICD-10-CM | POA: Diagnosis not present

## 2019-12-28 DIAGNOSIS — Z87891 Personal history of nicotine dependence: Secondary | ICD-10-CM | POA: Insufficient documentation

## 2019-12-28 DIAGNOSIS — K449 Diaphragmatic hernia without obstruction or gangrene: Secondary | ICD-10-CM | POA: Diagnosis not present

## 2019-12-28 DIAGNOSIS — R12 Heartburn: Secondary | ICD-10-CM | POA: Insufficient documentation

## 2019-12-28 DIAGNOSIS — I10 Essential (primary) hypertension: Secondary | ICD-10-CM | POA: Diagnosis not present

## 2019-12-28 DIAGNOSIS — Z79899 Other long term (current) drug therapy: Secondary | ICD-10-CM | POA: Insufficient documentation

## 2019-12-28 DIAGNOSIS — M069 Rheumatoid arthritis, unspecified: Secondary | ICD-10-CM | POA: Diagnosis not present

## 2019-12-28 DIAGNOSIS — N400 Enlarged prostate without lower urinary tract symptoms: Secondary | ICD-10-CM | POA: Diagnosis not present

## 2019-12-28 DIAGNOSIS — Z9049 Acquired absence of other specified parts of digestive tract: Secondary | ICD-10-CM | POA: Insufficient documentation

## 2019-12-28 DIAGNOSIS — Z01818 Encounter for other preprocedural examination: Secondary | ICD-10-CM

## 2019-12-28 DIAGNOSIS — E785 Hyperlipidemia, unspecified: Secondary | ICD-10-CM | POA: Diagnosis not present

## 2019-12-28 DIAGNOSIS — K219 Gastro-esophageal reflux disease without esophagitis: Secondary | ICD-10-CM | POA: Insufficient documentation

## 2019-12-28 DIAGNOSIS — K317 Polyp of stomach and duodenum: Secondary | ICD-10-CM | POA: Insufficient documentation

## 2019-12-28 HISTORY — PX: ESOPHAGOGASTRODUODENOSCOPY (EGD) WITH PROPOFOL: SHX5813

## 2019-12-28 HISTORY — DX: Unspecified abdominal hernia without obstruction or gangrene: K46.9

## 2019-12-28 LAB — GLUCOSE, CAPILLARY: Glucose-Capillary: 152 mg/dL — ABNORMAL HIGH (ref 70–99)

## 2019-12-28 SURGERY — ESOPHAGOGASTRODUODENOSCOPY (EGD) WITH PROPOFOL
Anesthesia: General

## 2019-12-28 MED ORDER — SODIUM CHLORIDE 0.9 % IV SOLN: INTRAVENOUS | Status: DC

## 2019-12-28 MED ORDER — PROPOFOL 500 MG/50ML IV EMUL
INTRAVENOUS | Status: DC | PRN
  Administered 2019-12-28: 150 ug/kg/min via INTRAVENOUS

## 2019-12-28 MED ORDER — PROPOFOL 10 MG/ML IV BOLUS
INTRAVENOUS | Status: DC | PRN
  Administered 2019-12-28 (×2): 20 mg via INTRAVENOUS
  Administered 2019-12-28: 60 mg via INTRAVENOUS

## 2019-12-28 MED ORDER — PROPOFOL 500 MG/50ML IV EMUL
INTRAVENOUS | Status: AC
  Filled 2019-12-28: qty 50

## 2019-12-28 MED ORDER — LIDOCAINE HCL (PF) 2 % IJ SOLN
INTRAMUSCULAR | Status: AC
  Filled 2019-12-28: qty 5

## 2019-12-28 NOTE — H&P (Signed)
Brandon Lame, MD [address] [place], [place] [address] Phone:[phone] Fax : [phone]  Primary Care Physician:  Letta Median, MD Primary Gastroenterologist:  Dr. Allen Norris  Pre-Procedure History & Physical: HPI:  Brandon Benjamin is a 74 y.o. male is here for an endoscopy.   Past Medical History:  Diagnosis Date  . Abdominal hernia   . Anemia   . Anxiety   . Balanitis   . Balanitis   . BPH (benign prostatic hyperplasia)   . Diabetes ([place])   . Erectile dysfunction   . GERD (gastroesophageal reflux disease)   . History of kidney stones   . HLD (hyperlipidemia)   . HTN (hypertension)   . Iron deficiency anemia due to chronic blood loss 09/04/2017  . Over weight   . Priapism   . Scrotal cyst   . Urinary urgency     Past Surgical History:  Procedure Laterality Date  . CHOLECYSTECTOMY N/A 12/25/2016   Procedure: LAPAROSCOPIC CHOLECYSTECTOMY WITH INTRAOPERATIVE CHOLANGIOGRAM;  Surgeon: Jules Husbands, MD;  Location: [facility] ORS;  Service: General;  Laterality: N/A;  . COLONOSCOPY WITH PROPOFOL N/A 09/09/2017   Procedure: COLONOSCOPY WITH PROPOFOL;  Surgeon: [name], MD;  Location: [facility] ENDOSCOPY;  Service: Gastroenterology;  Laterality: N/A;  . CYSTOSCOPY WITH LITHOLAPAXY N/A 07/16/2017   Procedure: CYSTOSCOPY WITH LITHOLAPAXY;  Surgeon: Hollice Espy, MD;  Location: [facility] ORS;  Service: Urology;  Laterality: N/A;  . ERCP N/A 11/28/2016   Procedure: ENDOSCOPIC RETROGRADE CHOLANGIOPANCREATOGRAPHY (ERCP);  Surgeon: Brandon Lame, MD;  Location: [facility] ENDOSCOPY;  Service: Endoscopy;  Laterality: N/A;  . ESOPHAGOGASTRODUODENOSCOPY (EGD) WITH PROPOFOL N/A 09/09/2017   Procedure: ESOPHAGOGASTRODUODENOSCOPY (EGD) WITH PROPOFOL;  Surgeon: [name], MD;  Location: [facility] ENDOSCOPY;  Service: Gastroenterology;  Laterality: N/A;  . ESOPHAGOGASTRODUODENOSCOPY (EGD) WITH PROPOFOL N/A 12/01/2017   Procedure: ESOPHAGOGASTRODUODENOSCOPY (EGD) WITH PROPOFOL;  Surgeon: [name], MD;  Location: [facility] ENDOSCOPY;  Service: Gastroenterology;  Laterality: N/A;  . ESOPHAGOGASTRODUODENOSCOPY (EGD) WITH PROPOFOL N/A 02/09/2018   Procedure: ESOPHAGOGASTRODUODENOSCOPY (EGD) WITH PROPOFOL;  Surgeon: [name], MD;  Location: [facility] ENDOSCOPY;  Service: Gastroenterology;  Laterality: N/A;  . GIVENS CAPSULE STUDY N/A 12/01/2017   Procedure: GIVENS CAPSULE STUDY;  Surgeon: [name], MD;  Location: [facility] ENDOSCOPY;  Service: Gastroenterology;  Laterality: N/A;  . HOLEP-LASER ENUCLEATION OF THE PROSTATE WITH MORCELLATION N/A 07/11/2015   Procedure: HOLEP-LASER ENUCLEATION OF THE PROSTATE WITH MORCELLATION;  Surgeon: Hollice Espy, MD;  Location: [facility] ORS;  Service: Urology;  Laterality: N/A;  . TRANSURETHRAL RESECTION OF PROSTATE N/A 07/16/2017   Procedure: TRANSURETHRAL RESECTION OF THE PROSTATE (TURP);  Surgeon: Hollice Espy, MD;  Location: [facility] ORS;  Service: Urology;  Laterality: N/A;  . UMBILICAL HERNIA REPAIR  12/25/2016   Procedure: HERNIA REPAIR UMBILICAL ADULT;  Surgeon: Jules Husbands, MD;  Location: [facility] ORS;  Service: General;;    Prior to Admission medications   Medication Sig Start Date End Date Taking? Authorizing Provider  amLODipine (NORVASC) 10 MG tablet Take 10 mg by mouth daily.   Yes [provider]  hydrALAZINE (APRESOLINE) 25 MG tablet  08/26/17  Yes [provider]  lisinopril (PRINIVIL,ZESTRIL) 40 MG tablet Take 40 mg by mouth daily.   Yes [provider]  metFORMIN (GLUCOPHAGE) 1000 MG tablet Take 1,000 mg by mouth 2 (two) times daily. 05/24/19  Yes [provider]  metoprolol (LOPRESSOR) 100 MG tablet Take 100 mg by mouth 2 (two) times daily.  05/10/13  Yes [provider]  Multiple Vitamin (  MULTI-VITAMIN) tablet Take by mouth.   Yes [provider]  omeprazole (PRILOSEC) 40 MG capsule Take 1 capsule (40 mg total) by mouth in the morning and at bedtime. 12/13/19  Yes Benjamin, Brandon F, MD  potassium  chloride (K-DUR) 10 MEQ tablet Take 10 mEq by mouth daily.  12/23/16  Yes [provider]  [place] test strip  08/04/17   [provider]  ACCU-CHEK SOFTCLIX LANCETS lancets  09/26/17   [provider]  acetaminophen (TYLENOL 8 HOUR ARTHRITIS PAIN) 650 MG CR tablet Take 650 mg by mouth every 8 (eight) hours as needed for pain.    [provider]  cetirizine (ZYRTEC) 10 MG tablet Take 10 mg by mouth daily.    [provider]  citalopram (CELEXA) 10 MG tablet Take 10 mg by mouth daily.    [provider]  dicyclomine (BENTYL) 10 MG capsule  05/21/18   [provider]  docusate sodium (COLACE) 100 MG capsule Take 100 mg by mouth 2 (two) times daily.    [provider]  fluticasone (FLONASE) 50 MCG/ACT nasal spray Place 2 sprays into both nostrils daily.  08/31/13   [provider]  hydrochlorothiazide (HYDRODIURIL) 25 MG tablet Take 25 mg by mouth daily.     [provider]  linaclotide Rolan Lipa) 290 MCG CAPS capsule Take 1 capsule (290 mcg total) by mouth daily before breakfast. 01/28/18 04/30/18  [name], MD  LINZESS 290 MCG CAPS capsule Take 290 mcg by mouth daily.  05/14/18   [provider]  lovastatin (MEVACOR) 20 MG tablet Take 20 mg by mouth at bedtime.    [provider]  nystatin cream (MYCOSTATIN)  05/21/18   [provider]  ondansetron (ZOFRAN ODT) 4 MG disintegrating tablet Take 1 tablet (4 mg total) by mouth every 6 (six) hours as needed for nausea or vomiting. 12/09/19   Benjamin, Brandon Lies, MD  tamsulosin (FLOMAX) 0.4 MG CAPS capsule  05/25/18   [provider]    Allergies as of 12/17/2019  . (No Known Allergies)    Family History  Problem Relation Age of Onset  . Cancer Mother        blood stream  . High blood pressure Mother   . Hypertension Sister   . Anemia Sister   . Prostate cancer Neg Hx   . Bladder Cancer Neg Hx   . Kidney disease Neg Hx      Social History   Socioeconomic History  . Marital status: Married    Spouse name: Not on file  . Number of children: Not on file  . Years of education: Not on file  . Highest education level: Not on file  Occupational History  . Not on file  Tobacco Use  . Smoking status: Former Smoker    Types: Cigarettes    Quit date: 03/26/1995    Years since quitting: 24.7  . Smokeless tobacco: Never Used  Vaping Use  . Vaping Use: Never used  Substance and Sexual Activity  . Alcohol use: No  . Drug use: No  . Sexual activity: Not on file  Other Topics Concern  . Not on file  Social History Narrative  . Not on file   Social Determinants of Health   Financial Resource Strain:   . Difficulty of Paying Living Expenses: Not on file  Food Insecurity:   . Worried About Charity fundraiser in the Last Year: Not on file  . Ran  Out of Food in the Last Year: Not on file  Transportation Needs:   . Lack of Transportation (Medical): Not on file  . Lack of Transportation (Non-Medical): Not on file  Physical Activity:   . Days of Exercise per Week: Not on file  . Minutes of Exercise per Session: Not on file  Stress:   . Feeling of Stress : Not on file  Social Connections:   . Frequency of Communication with Friends and Family: Not on file  . Frequency of Social Gatherings with Friends and Family: Not on file  . Attends Religious Services: Not on file  . Active Member of Clubs or Organizations: Not on file  . Attends Archivist Meetings: Not on file  . Marital Status: Not on file  Intimate Partner Violence:   . Fear of Current or Ex-Partner: Not on file  . Emotionally Abused: Not on file  . Physically Abused: Not on file  . Sexually Abused: Not on file    Review of Systems: See HPI, otherwise negative ROS  Physical Exam: BP (!) 170/85   Pulse (!) 58   Temp 97.6 F (36.4 C) (Temporal)   Resp 16   Ht 5\' 9"  (1.753 m)   Wt 93 kg   SpO2 100%   BMI 30.27 kg/m   General:   Alert,  pleasant and cooperative in NAD Head:  Normocephalic and atraumatic. Neck:  Supple; no masses or thyromegaly. Lungs:  Clear throughout to auscultation.    Heart:  Regular rate and rhythm. Abdomen:  Soft, nontender and nondistended. Normal bowel sounds, without guarding, and without rebound.   Neurologic:  Alert and  oriented x4;  grossly normal neurologically.  Impression/Plan: Pelham Hennick Creegan is here for an endoscopy to be performed for GERD and a hiatal hernia  Risks, benefits, limitations, and alternatives regarding  endoscopy have been reviewed with the patient.  Questions have been answered.  All parties agreeable.   Brandon Lame, MD  12/28/2019, 8:01 AM

## 2019-12-28 NOTE — Anesthesia Postprocedure Evaluation (Signed)
Anesthesia Post Note  Patient: Volney Reierson Benda  Procedure(s) Performed: ESOPHAGOGASTRODUODENOSCOPY (EGD) WITH PROPOFOL (N/A )  Patient location during evaluation: Endoscopy Anesthesia Type: General Level of consciousness: awake and alert Pain management: pain level controlled Vital Signs Assessment: post-procedure vital signs reviewed and stable Respiratory status: spontaneous breathing, nonlabored ventilation, respiratory function stable and patient connected to nasal cannula oxygen Cardiovascular status: blood pressure returned to baseline and stable Postop Assessment: no apparent nausea or vomiting Anesthetic complications: no   No complications documented.   Last Vitals:  Vitals:   12/28/19 0840 12/28/19 0850  BP: 122/73 133/79  Pulse: (!) 56 (!) 52  Resp: 14 17  Temp:    SpO2: 97% 99%    Last Pain:  Vitals:   12/28/19 0820  TempSrc: Temporal                 Precious Haws Caitlin Hillmer

## 2019-12-28 NOTE — Transfer of Care (Signed)
Immediate Anesthesia Transfer of Care Note  Patient: Brandon Benjamin  Procedure(s) Performed: ESOPHAGOGASTRODUODENOSCOPY (EGD) WITH PROPOFOL (N/A )  Patient Location: PACU and Endoscopy Unit  Anesthesia Type:General  Level of Consciousness: awake, drowsy and patient cooperative  Airway & Oxygen Therapy: Patient Spontanous Breathing and Patient connected to nasal cannula oxygen  Post-op Assessment: Report given to RN and Post -op Vital signs reviewed and stable  Post vital signs: Reviewed and stable  Last Vitals:  Vitals Value Taken Time  BP 155/107 12/28/19 0829  Temp    Pulse 62 12/28/19 0829  Resp 15 12/28/19 0829  SpO2 96 % 12/28/19 0829  Vitals shown include unvalidated device data.  Last Pain:  Vitals:   12/28/19 0710  TempSrc: Temporal         Complications: No complications documented.

## 2019-12-28 NOTE — Op Note (Signed)
[facility] Gastroenterology Patient Name: Brandon Benjamin Procedure Date: 12/28/2019 8:05 AM MRN: [id number] Account #: [id number] Date of Birth: Brandon Benjamin 31, 1947 Admit Type: Outpatient Age: 74 Room: [facility] ENDO ROOM 4 Gender: Male Note Status: Finalized Procedure:             Upper GI endoscopy Indications:           Heartburn Providers:             [name] MD, MD Referring MD:          Baxter Kail. Rebeca Alert MD, MD (Referring MD) Medicines:             Propofol per Anesthesia Complications:         No immediate complications. Procedure:             Pre-Anesthesia Assessment:                        - Prior to the procedure, a History and Physical was                         performed, and patient medications and allergies were                         reviewed. The patient's tolerance of previous                         anesthesia was also reviewed. The risks and benefits                         of the procedure and the sedation options and risks                         were discussed with the patient. All questions were                         answered, and informed consent was obtained. Prior                         Anticoagulants: The patient has taken no previous                         anticoagulant or antiplatelet agents. ASA Grade                         Assessment: II - A patient with mild systemic disease.                         After reviewing the risks and benefits, the patient                         was deemed in satisfactory condition to undergo the                         procedure.                        After obtaining informed consent, the endoscope was  passed under direct vision. Throughout the procedure,                         the patient's blood pressure, pulse, and oxygen                         saturations were monitored continuously. The Endoscope                         was introduced through the mouth, and advanced to the                          second part of duodenum. The upper GI endoscopy was                         accomplished without difficulty. The patient tolerated                         the procedure well. Findings:      A large hiatal hernia was present.      A single pedunculated polyp with no bleeding and no stigmata of recent       bleeding was found in the gastric body. Biopsies were taken with a cold       forceps for histology.      The examined duodenum was normal. Impression:            - Large hiatal hernia.                        - A single gastric polyp. Biopsied.                        - Normal examined duodenum. Recommendation:        - Discharge patient to home.                        - Resume previous diet.                        - Continue present medications.                        - Await pathology results. Procedure Code(s):     --- Professional ---                        [phone], Esophagogastroduodenoscopy, flexible,                         transoral; with biopsy, single or multiple Diagnosis Code(s):     --- Professional ---                        R12, Heartburn                        K31.7, Polyp of stomach and duodenum CPT copyright 2019 American Medical Association. All rights reserved. The codes documented in this report are preliminary and upon coder review [place]  be revised to meet current compliance requirements. [name] MD, MD 12/28/2019 8:24:29 AM This report has been signed electronically. Number of Addenda: 0  Note Initiated On: 12/28/2019 8:05 AM Estimated Blood Loss:  Estimated blood loss: none.      [facility]

## 2019-12-28 NOTE — Anesthesia Preprocedure Evaluation (Signed)
Anesthesia Evaluation  Patient identified by MRN, date of birth, ID band Patient awake    Reviewed: Allergy & Precautions, NPO status , Patient's Chart, lab work & pertinent test results  History of Anesthesia Complications Negative for: history of anesthetic complications  Airway Mallampati: III  TM Distance: >3 FB Neck ROM: Full    Dental  (+) Edentulous Upper, Edentulous Lower   Pulmonary neg sleep apnea, neg COPD, former smoker,    breath sounds clear to auscultation- rhonchi (-) wheezing      Cardiovascular Exercise Tolerance: Good hypertension, Pt. on medications (-) CAD, (-) Past MI, (-) Cardiac Stents and (-) CABG  Rhythm:Regular Rate:Normal - Systolic murmurs and - Diastolic murmurs    Neuro/Psych Anxiety negative neurological ROS     GI/Hepatic Neg liver ROS, GERD  ,  Endo/Other  diabetes, Oral Hypoglycemic Agents  Renal/GU Renal InsufficiencyRenal disease     Musculoskeletal   Abdominal (+) + obese,   Peds  Hematology  (+) Blood dyscrasia, anemia ,   Anesthesia Other Findings Past Medical History: No date: Anemia No date: Anxiety No date: Balanitis No date: Balanitis No date: BPH (benign prostatic hyperplasia) No date: Diabetes ([facility]) No date: Erectile dysfunction No date: GERD (gastroesophageal reflux disease) No date: History of kidney stones No date: HLD (hyperlipidemia) No date: HTN (hypertension) 09/04/2017: Iron deficiency anemia due to chronic blood loss No date: Over weight No date: Priapism No date: Scrotal cyst No date: Urinary urgency   Reproductive/Obstetrics                             Anesthesia Physical  Anesthesia Plan  ASA: III  Anesthesia Plan: General   Post-op Pain Management:    Induction: Intravenous  PONV Risk Score and Plan: 1 and Propofol infusion and TIVA  Airway Management Planned: Natural Airway  Additional Equipment:    Intra-op Plan:   Post-operative Plan:   Informed Consent: I have reviewed the patients History and Physical, chart, labs and discussed the procedure including the risks, benefits and alternatives for the proposed anesthesia with the patient or authorized representative who has indicated his/her understanding and acceptance.     Dental advisory given  Plan Discussed with: CRNA and Anesthesiologist  Anesthesia Plan Comments: (Patient consented for risks of anesthesia including but not limited to:  - adverse reactions to medications - risk of intubation if required - damage to eyes, teeth, lips or other oral mucosa - nerve damage due to positioning  - sore throat or hoarseness - Damage to heart, brain, nerves, lungs, other parts of body or loss of life  Patient voiced understanding.)        Anesthesia Quick Evaluation

## 2019-12-28 NOTE — Progress Notes (Signed)
°   12/28/19 0735  Clinical Encounter Type  Visited With Family  Visit Type Initial  Referral From Chaplain  Consult/Referral To Chaplain  While rounding SDS waiting area, chaplain briefly spoke with Pt's wife to see how she was doing while waiting. Pt's wife said she was fine and did not have any questions or concerns. Chaplain wished her well and left.

## 2019-12-29 ENCOUNTER — Encounter: Payer: Self-pay | Admitting: Gastroenterology

## 2019-12-29 LAB — SURGICAL PATHOLOGY

## 2020-01-03 ENCOUNTER — Other Ambulatory Visit: Payer: Self-pay

## 2020-01-03 ENCOUNTER — Encounter: Payer: Self-pay | Admitting: Surgery

## 2020-01-03 ENCOUNTER — Ambulatory Visit (INDEPENDENT_AMBULATORY_CARE_PROVIDER_SITE_OTHER): Payer: Medicare Other | Admitting: Surgery

## 2020-01-03 VITALS — BP 163/94 | HR 67 | Temp 98.3°F | Resp 12 | Ht 69.0 in | Wt 208.0 lb

## 2020-01-03 DIAGNOSIS — K449 Diaphragmatic hernia without obstruction or gangrene: Secondary | ICD-10-CM | POA: Diagnosis not present

## 2020-01-03 NOTE — Patient Instructions (Addendum)
Our surgery scheduler will contact you within the next 24-48 hours to discuss surgery. Please have the BLUE sheet available when she contacts you to discuss the preparation prior to surgery along with the times of surgery. If you have any questions or concerns regarding the surgery, please do not hesitate to give our office a call.  Hiatal Hernia  A hiatal hernia occurs when part of the stomach slides above the muscle that separates the abdomen from the chest (diaphragm). A person can be born with a hiatal hernia (congenital), or it Witham develop over time. In almost all cases of hiatal hernia, only the top part of the stomach pushes through the diaphragm. Many people have a hiatal hernia with no symptoms. The larger the hernia, the more likely it is that you will have symptoms. In some cases, a hiatal hernia allows stomach acid to flow back into the tube that carries food from your mouth to your stomach (esophagus). This Jani cause heartburn symptoms. Severe heartburn symptoms Roots mean that you have developed a condition called gastroesophageal reflux disease (GERD). What are the causes? This condition is caused by a weakness in the opening (hiatus) where the esophagus passes through the diaphragm to attach to the upper part of the stomach. A person Mikrut be born with a weakness in the hiatus, or a weakness can develop over time. What increases the risk? This condition is more likely to develop in:  Older people. Age is a major risk factor for a hiatal hernia, especially if you are over the age of 61.  Pregnant women.  People who are overweight.  People who have frequent constipation. What are the signs or symptoms? Symptoms of this condition usually develop in the form of GERD symptoms. Symptoms include:  Heartburn.  Belching.  Indigestion.  Trouble swallowing.  Coughing or wheezing.  Sore throat.  Hoarseness.  Chest pain.  Nausea and vomiting. How is this diagnosed? This  condition Kliebert be diagnosed during testing for GERD. Tests that Schuelke be done include:  X-rays of your stomach or chest.  An upper gastrointestinal (GI) series. This is an X-ray exam of your GI tract that is taken after you swallow a chalky liquid that shows up clearly on the X-ray.  Endoscopy. This is a procedure to look into your stomach using a thin, flexible tube that has a tiny camera and light on the end of it. How is this treated? This condition Correira be treated by:  Dietary and lifestyle changes to help reduce GERD symptoms.  Medicines. These Laduke include: ? Over-the-counter antacids. ? Medicines that make your stomach empty more quickly. ? Medicines that block the production of stomach acid (H2 blockers). ? Stronger medicines to reduce stomach acid (proton pump inhibitors).  Surgery to repair the hernia, if other treatments are not helping. If you have no symptoms, you Uyeno not need treatment. Follow these instructions at home: Lifestyle and activity  Do not use any products that contain nicotine or tobacco, such as cigarettes and e-cigarettes. If you need help quitting, ask your health care provider.  Try to achieve and maintain a healthy body weight.  Avoid putting pressure on your abdomen. Anything that puts pressure on your abdomen increases the amount of acid that Rennie be pushed up into your esophagus. ? Avoid bending over, especially after eating. ? Raise the head of your bed by putting blocks under the legs. This keeps your head and esophagus higher than your stomach. ? Do not wear tight clothing  around your chest or stomach. ? Try not to strain when having a bowel movement, when urinating, or when lifting heavy objects. Eating and drinking  Avoid foods that can worsen GERD symptoms. These Hamlett include: ? Fatty foods, like fried foods. ? Citrus fruits, like oranges or lemon. ? Other foods and drinks that contain acid, like orange juice or tomatoes. ? Spicy  food. ? Chocolate.  Eat frequent small meals instead of three large meals a day. This helps prevent your stomach from getting too full. ? Eat slowly. ? Do not lie down right after eating. ? Do not eat 1-2 hours before bed.  Do not drink beverages with caffeine. These include cola, coffee, cocoa, and tea.  Do not drink alcohol. General instructions  Take over-the-counter and prescription medicines only as told by your health care provider.  Keep all follow-up visits as told by your health care provider. This is important. Contact a health care provider if:  Your symptoms are not controlled with medicines or lifestyle changes.  You are having trouble swallowing.  You have coughing or wheezing that will not go away. Get help right away if:  Your pain is getting worse.  Your pain spreads to your arms, neck, jaw, teeth, or back.  You have shortness of breath.  You sweat for no reason.  You feel sick to your stomach (nauseous) or you vomit.  You vomit blood.  You have bright red blood in your stools.  You have black, tarry stools. This information is not intended to replace advice given to you by your health care provider. Make sure you discuss any questions you have with your health care provider. Document Revised: 02/21/2017 Document Reviewed: 10/14/2016 Elsevier Patient Education  [place].

## 2020-01-04 ENCOUNTER — Telehealth: Payer: Self-pay | Admitting: Surgery

## 2020-01-04 NOTE — Telephone Encounter (Signed)
Patient has been advised of Pre-Admission date/time, COVID Testing date and Surgery date.  Surgery Date: 01/18/20 Preadmission Testing Date: 01/12/20 (phone 8a-1p) Covid Testing Date: 01/14/20 - patient advised to go to the [place] [facility] between 8a-1p  Patient has been made aware to call [phone], between 1-3:00pm the day before surgery, to find out what time to arrive for surgery.

## 2020-01-05 ENCOUNTER — Encounter: Payer: Self-pay | Admitting: Surgery

## 2020-01-05 NOTE — H&P (View-Only) (Signed)
Outpatient Surgical Follow Up  01/05/2020  Brandon Benjamin is an 74 y.o. male.   Chief Complaint  Patient presents with  . Follow-up    abd pain    HPI: Brandon Benjamin is a 74 year old male with a prior history of laparoscopic cholecystectomy done by me close to 3 years ago now presents with a 1 to 45-month history of right upper quadrant pain.  He has had upper and lower scopes that I have personally reviewed the images.  There is evidence of a large hiatal hernia.  Colonoscopy showed 6 polyps that were excised.  These studies were done 2019.  He also underwent small bowel capsule study that was normal.  He does have some intermittent constipation and had a recent hemorrhoidal banding by Dr. Vicente Males. He has persistent abdominal pain to the right side.  Etiology is currently puzzle and not well-known.  I have personally reviewed both barium swallow and CT scan showing a large hiatal hernia without any evidence of esophageal lesions.  Contrast flows to the stomach without any issues.     Past Medical History:  Diagnosis Date  . Abdominal hernia   . Anemia   . Anxiety   . Balanitis   . Balanitis   . BPH (benign prostatic hyperplasia)   . Diabetes ([place])   . Erectile dysfunction   . GERD (gastroesophageal reflux disease)   . History of kidney stones   . HLD (hyperlipidemia)   . HTN (hypertension)   . Iron deficiency anemia due to chronic blood loss 09/04/2017  . Over weight   . Priapism   . Scrotal cyst   . Urinary urgency     Past Surgical History:  Procedure Laterality Date  . CHOLECYSTECTOMY N/A 12/25/2016   Procedure: LAPAROSCOPIC CHOLECYSTECTOMY WITH INTRAOPERATIVE CHOLANGIOGRAM;  Surgeon: Jules Husbands, MD;  Location: [facility] ORS;  Service: General;  Laterality: N/A;  . COLONOSCOPY WITH PROPOFOL N/A 09/09/2017   Procedure: COLONOSCOPY WITH PROPOFOL;  Surgeon: [name], MD;  Location: [facility] ENDOSCOPY;  Service: Gastroenterology;  Laterality: N/A;  . CYSTOSCOPY WITH LITHOLAPAXY N/A  07/16/2017   Procedure: CYSTOSCOPY WITH LITHOLAPAXY;  Surgeon: Hollice Espy, MD;  Location: [facility] ORS;  Service: Urology;  Laterality: N/A;  . ERCP N/A 11/28/2016   Procedure: ENDOSCOPIC RETROGRADE CHOLANGIOPANCREATOGRAPHY (ERCP);  Surgeon: [name], MD;  Location[facility]COPY;  Service: Endoscopy;  Laterality: N/A;  . ESOPHAGOGASTRODUODENOSCOPY (EGD) WITH PROPOFOL N/A 09/09/2017   Procedure: ESOPHAGOGASTRODUODENOSCOPY (EGD) WITH PROPOFOL;  Surgeon: [name], MD;  Location: [facility] ENDOSCOPY;  Service: Gastroenterology;  Laterality: N/A;  . ESOPHAGOGASTRODUODENOSCOPY (EGD) WITH PROPOFOL N/A 12/01/2017   Procedure: ESOPHAGOGASTRODUODENOSCOPY (EGD) WITH PROPOFOL;  Surgeon: [name], MD;  Location: [facility] ENDOSCOPY;  Service: Gastroenterology;  Laterality: N/A;  . ESOPHAGOGASTRODUODENOSCOPY (EGD) WITH PROPOFOL N/A 02/09/2018   Procedure: ESOPHAGOGASTRODUODENOSCOPY (EGD) WITH PROPOFOL;  Surgeon: [name], MD;  Location: [facility] ENDOSCOPY;  Service: Gastroenterology;  Laterality: N/A;  . ESOPHAGOGASTRODUODENOSCOPY (EGD) WITH PROPOFOL N/A 12/28/2019   Procedure: ESOPHAGOGASTRODUODENOSCOPY (EGD) WITH PROPOFOL;  Surgeon: [name], MD;  Location: [facility] ENDOSCOPY;  Service: Endoscopy;  Laterality: N/A;  . GIVENS CAPSULE STUDY N/A 12/01/2017   Procedure: GIVENS CAPSULE STUDY;  Surgeon: [name], MD;  Location: [facility] ENDOSCOPY;  Service: Gastroenterology;  Laterality: N/A;  . HOLEP-LASER ENUCLEATION OF THE PROSTATE WITH MORCELLATION N/A 07/11/2015   Procedure: HOLEP-LASER ENUCLEATION OF THE PROSTATE WITH MORCELLATION;  Surgeon: Hollice Espy, MD;  Location: [facility] ORS;  Service: Urology;  Laterality: N/A;  . TRANSURETHRAL RESECTION OF PROSTATE N/A 07/16/2017  Procedure: TRANSURETHRAL RESECTION OF THE PROSTATE (TURP);  Surgeon: Hollice Espy, MD;  Location: [facility] ORS;  Service: Urology;  Laterality: N/A;  . UMBILICAL HERNIA REPAIR  12/25/2016   Procedure: HERNIA REPAIR UMBILICAL ADULT;  Surgeon: Jules Husbands, MD;  Location: [facility] ORS;  Service: General;;    Family History  Problem Relation Age of Onset  . Cancer Mother        blood stream  . High blood pressure Mother   . Hypertension Sister   . Anemia Sister   . Prostate cancer Neg Hx   . Bladder Cancer Neg Hx   . Kidney disease Neg Hx     Social History:  reports that he quit smoking about 24 years ago. His smoking use included cigarettes. He has never used smokeless tobacco. He reports that he does not drink alcohol and does not use drugs.  Allergies: No Known Allergies  Medications reviewed.    ROS Full ROS performed and is otherwise negative other than what is stated in HPI   BP (!) 163/94   Pulse 67   Temp 98.3 F (36.8 C) (Oral)   Resp 12   Ht 5\' 9"  (1.753 m)   Wt 208 lb (94.3 kg)   SpO2 96%   BMI 30.72 kg/m  PE: NAD< ALERT Nck: no masses, no JVD, trach midline Chest CTA, lungs CTA bilaterally CV: NSR, s1,s2, no murmurs Abd: soft, mild TTP RUQ no peritonitis, no masses Ext: well perfused and no edema Neuro: GCA  15, no motor or sens deficits/   Assessment/Plan: 74 year old male with significant hiatal hernia and significant reflux disease.  I do think that he will benefit from hernia repair and antireflux procedure.  Is puzzling that the fact that he has right upper quadrant pain as well.  I had an extensive discussion with him regarding expectations.  He will be very rare to have right upper quadrant pain related to the esophageal hernia.  I was extremely clear with him that if we were to proceed with surgery for the hiatal hernia I was very uncertain whether or not this will improve his pain.  He still has significant cough significant reflux and significant issues with a hiatal hernia and wishes to have this fixed.  Procedure discussed with patient in detail.  Risks, benefits and possible implications including but not limited to: Bleeding, infection, esophageal injury, swallowing issues, chronic pain and  persistent abdominal pain.  He understands and wishes to proceed.  Extensive counseling provided   Greater than 50% of the 45 minutes  visit was spent in counseling/coordination of care   Caroleen Hamman, MD [place] Surgeon

## 2020-01-05 NOTE — Progress Notes (Signed)
Outpatient Surgical Follow Up  01/05/2020  Brandon Benjamin is an 74 y.o. male.   Chief Complaint  Patient presents with  . Follow-up    abd pain    HPI: Brandon Benjamin is a 74 year old male with a prior history of laparoscopic cholecystectomy done by me close to 3 years ago now presents with a 1 to 68-month history of right upper quadrant pain.  He has had upper and lower scopes that I have personally reviewed the images.  There is evidence of a large hiatal hernia.  Colonoscopy showed 6 polyps that were excised.  These studies were done 2019.  He also underwent small bowel capsule study that was normal.  He does have some intermittent constipation and had a recent hemorrhoidal banding by Dr. Vicente Males. He has persistent abdominal pain to the right side.  Etiology is currently puzzle and not well-known.  I have personally reviewed both barium swallow and CT scan showing a large hiatal hernia without any evidence of esophageal lesions.  Contrast flows to the stomach without any issues.     Past Medical History:  Diagnosis Date  . Abdominal hernia   . Anemia   . Anxiety   . Balanitis   . Balanitis   . BPH (benign prostatic hyperplasia)   . Diabetes ([place])   . Erectile dysfunction   . GERD (gastroesophageal reflux disease)   . History of kidney stones   . HLD (hyperlipidemia)   . HTN (hypertension)   . Iron deficiency anemia due to chronic blood loss 09/04/2017  . Over weight   . Priapism   . Scrotal cyst   . Urinary urgency     Past Surgical History:  Procedure Laterality Date  . CHOLECYSTECTOMY N/A 12/25/2016   Procedure: LAPAROSCOPIC CHOLECYSTECTOMY WITH INTRAOPERATIVE CHOLANGIOGRAM;  Surgeon: Jules Husbands, MD;  Location: [facility] ORS;  Service: General;  Laterality: N/A;  . COLONOSCOPY WITH PROPOFOL N/A 09/09/2017   Procedure: COLONOSCOPY WITH PROPOFOL;  Surgeon: [name], MD;  Location[facility] ENDOSCOPY;  Service: Gastroenterology;  Laterality: N/A;  . CYSTOSCOPY WITH LITHOLAPAXY N/A  07/16/2017   Procedure: CYSTOSCOPY WITH LITHOLAPAXY;  Surgeon: Hollice Espy, MD;  Location: [facility] ORS;  Service: Urology;  Laterality: N/A;  . ERCP N/A 11/28/2016   Procedure: ENDOSCOPIC RETROGRADE CHOLANGIOPANCREATOGRAPHY (ERCP);  Surgeon: [name], MD;  Location: [facility] ENDOSCOPY;  Service: Endoscopy;  Laterality: N/A;  . ESOPHAGOGASTRODUODENOSCOPY (EGD) WITH PROPOFOL N/A 09/09/2017   Procedure: ESOPHAGOGASTRODUODENOSCOPY (EGD) WITH PROPOFOL;  Surgeon: [name], MD;  Location: [facility] ENDOSCOPY;  Service: Gastroenterology;  Laterality: N/A;  . ESOPHAGOGASTRODUODENOSCOPY (EGD) WITH PROPOFOL N/A 12/01/2017   Procedure: ESOPHAGOGASTRODUODENOSCOPY (EGD) WITH PROPOFOL;  Surgeon: [name], MD;  Location: [facility] ENDOSCOPY;  Service: Gastroenterology;  Laterality: N/A;  . ESOPHAGOGASTRODUODENOSCOPY (EGD) WITH PROPOFOL N/A 02/09/2018   Procedure: ESOPHAGOGASTRODUODENOSCOPY (EGD) WITH PROPOFOL;  Surgeon: [name], MD;  Location: [facility] END[facility]COPY;  Service: Gastroenterology;  Laterality: N/A;  . ESOPHAGOGASTRODUODENOSCOPY (EGD) WITH PROPOFOL N/A 12/28/2019   Procedure: ESOPHAGOGASTRODUODENOSCOPY (EGD) WITH PROPOFOL;  Surgeon: [name], MD;  Location: [facility] ENDOSCOPY;  Service: Endoscopy;  Laterality: N/A;  . GIVENS CAPSULE STUDY N/A 12/01/2017   Procedure: GIVENS CAPSULE STUDY;  Surgeon: [name], MD;  Location: [facility] ENDOSCOPY;  Service: Gastroenterology;  Laterality: N/A;  . HOLEP-LASER ENUCLEATION OF THE PROSTATE WITH MORCELLATION N/A 07/11/2015   Procedure: HOLEP-LASER ENUCLEATION OF THE PROSTATE WITH MORCELLATION;  Surgeon: Hollice Espy, MD;  Location: [facility] ORS;  Service: Urology;  Laterality: N/A;  . TRANSURETHRAL RESECTION OF PROSTATE N/A 07/16/2017  Procedure: TRANSURETHRAL RESECTION OF THE PROSTATE (TURP);  Surgeon: Hollice Espy, MD;  Location: [facility] ORS;  Service: Urology;  Laterality: N/A;  . UMBILICAL HERNIA REPAIR  12/25/2016   Procedure: HERNIA REPAIR UMBILICAL ADULT;  Surgeon: Jules Husbands, MD;  Location: [facility] ORS;  Service: General;;    Family History  Problem Relation Age of Onset  . Cancer Mother        blood stream  . High blood pressure Mother   . Hypertension Sister   . Anemia Sister   . Prostate cancer Neg Hx   . Bladder Cancer Neg Hx   . Kidney disease Neg Hx     Social History:  reports that he quit smoking about 24 years ago. His smoking use included cigarettes. He has never used smokeless tobacco. He reports that he does not drink alcohol and does not use drugs.  Allergies: No Known Allergies  Medications reviewed.    ROS Full ROS performed and is otherwise negative other than what is stated in HPI   BP (!) 163/94   Pulse 67   Temp 98.3 F (36.8 C) (Oral)   Resp 12   Ht 5\' 9"  (1.753 m)   Wt 208 lb (94.3 kg)   SpO2 96%   BMI 30.72 kg/m  PE: NAD< ALERT Nck: no masses, no JVD, trach midline Chest CTA, lungs CTA bilaterally CV: NSR, s1,s2, no murmurs Abd: soft, mild TTP RUQ no peritonitis, no masses Ext: well perfused and no edema Neuro: GCA  15, no motor or sens deficits/   Assessment/Plan: 74 year old male with significant hiatal hernia and significant reflux disease.  I do think that he will benefit from hernia repair and antireflux procedure.  Is puzzling that the fact that he has right upper quadrant pain as well.  I had an extensive discussion with him regarding expectations.  He will be very rare to have right upper quadrant pain related to the esophageal hernia.  I was extremely clear with him that if we were to proceed with surgery for the hiatal hernia I was very uncertain whether or not this will improve his pain.  He still has significant cough significant reflux and significant issues with a hiatal hernia and wishes to have this fixed.  Procedure discussed with patient in detail.  Risks, benefits and possible implications including but not limited to: Bleeding, infection, esophageal injury, swallowing issues, chronic pain and  persistent abdominal pain.  He understands and wishes to proceed.  Extensive counseling provided   Greater than 50% of the 45 minutes  visit was spent in counseling/coordination of care   Caroleen Hamman, MD [place] Surgeon

## 2020-01-10 NOTE — Addendum Note (Signed)
Addended by: Caroleen Hamman F on: 01/10/2020 09:30 AM   Modules accepted: Orders, SmartSet

## 2020-01-12 ENCOUNTER — Other Ambulatory Visit
Admission: RE | Admit: 2020-01-12 | Discharge: 2020-01-12 | Disposition: A | Discharge status: Home / Self Care | Payer: Medicare Other | Source: Ambulatory Visit | Attending: Surgery | Admitting: Surgery

## 2020-01-12 ENCOUNTER — Other Ambulatory Visit: Payer: Self-pay

## 2020-01-12 ENCOUNTER — Ambulatory Visit: Payer: Self-pay | Admitting: Surgery

## 2020-01-12 DIAGNOSIS — Z01818 Encounter for other preprocedural examination: Secondary | ICD-10-CM | POA: Insufficient documentation

## 2020-01-12 DIAGNOSIS — E119 Type 2 diabetes mellitus without complications: Secondary | ICD-10-CM | POA: Insufficient documentation

## 2020-01-12 DIAGNOSIS — I10 Essential (primary) hypertension: Secondary | ICD-10-CM | POA: Insufficient documentation

## 2020-01-12 NOTE — Patient Instructions (Signed)
Your procedure is scheduled on: Tuesday 01/18/20.  Report to DAY SURGERY DEPARTMENT LOCATED ON 2ND FLOOR MEDICAL MALL ENTRANCE. To find out your arrival time please call [phone] between 1PM - 3PM on Monday 01/17/20.   Remember: Instructions that are not followed completely [place] result in serious medical risk, up to and including death, or upon the discretion of your surgeon and anesthesiologist your surgery Zenz need to be rescheduled.     __X__ 1. Do not eat food after midnight the night before your procedure.                 No gum chewing or hard candies. You Lurz drink SUGAR FREE clear liquids up to 2 hours                 before you are scheduled to arrive for your surgery- DO NOT drink clear                 liquids within 2 hours of the start of your surgery.                 __X__2.  On the morning of surgery brush your teeth with toothpaste and water, you Plyler rinse your mouth with mouthwash if you wish.  Do not swallow any toothpaste or mouthwash.    __X__ 3.  No Alcohol for 24 hours before or after surgery.  __X__ 4.  Do Not Smoke or use e-cigarettes For 24 Hours Prior to Your Surgery.                 Do not use any chewable tobacco products for at least 6 hours prior to                 surgery.  __X__5.  Notify your doctor if there is any change in your medical condition      (cold, fever, infections).      Do NOT wear jewelry, make-up, hairpins, clips or nail polish. Do NOT wear lotions, powders, or perfumes.  Do NOT shave 48 hours prior to surgery. Men Vondrak shave face and neck. Do NOT bring valuables to the hospital.     [facility] is not responsible for any belongings or valuables.   Contacts, dentures/partials or body piercings Rinn not be worn into surgery. Bring a case for your contacts, glasses or hearing aids, a denture cup will be supplied.    Patients discharged the day of surgery will not be allowed to drive home.     __X__ Take these medicines the  morning of surgery with A SIP OF WATER:     1. amLODipine (NORVASC)  2. cetirizine (ZYRTEC)  3. citalopram (CELEXA)  4. fluticasone (FLONASE)  5. hydrALAZINE (APRESOLINE)   6. metoprolol (LOPRESSOR)   7. omeprazole (PRILOSEC)    __X__ Use CHG Soap as directed  __X__ Use inhalers on the day of surgery. Also bring the inhaler with you to the hospital on the morning of surgery.  __X__ Stop Metformin 2 days prior to surgery. Your last dose will be on Sunday evening 01/16/20.    __X__ Stop Anti-inflammatories 7 days before surgery such as Advil, Ibuprofen, Motrin, BC or Goodies Powder, Naprosyn, Naproxen, Aleve, Aspirin, Meloxicam. Kalas take Tylenol if needed for pain or discomfort.   __X__Do not start taking any new herbal supplements or vitamins prior to your procedure.    Wear comfortable clothing (specific to your surgery type) to the hospital.  Plan for stool softeners  for home use; pain medications have a tendency to cause constipation. You can also help prevent constipation by eating foods high in fiber such as fruits and vegetables and drinking plenty of fluids as your diet allows.  After surgery, you can prevent lung complications by doing breathing exercises.Take deep breaths and cough every 1-2 hours. Your doctor Mckendry order a device called an Incentive Spirometer to help you take deep breaths.  Please call the [place] Department at [phone] if you have any questions about these instructions.

## 2020-01-14 ENCOUNTER — Other Ambulatory Visit: Payer: Self-pay

## 2020-01-14 ENCOUNTER — Encounter
Admission: RE | Admit: 2020-01-14 | Discharge: 2020-01-14 | Disposition: A | Discharge status: Home / Self Care | Payer: Medicare Other | Source: Ambulatory Visit | Attending: Surgery | Admitting: Surgery

## 2020-01-14 ENCOUNTER — Other Ambulatory Visit: Payer: Medicare Other

## 2020-01-14 DIAGNOSIS — I10 Essential (primary) hypertension: Secondary | ICD-10-CM | POA: Insufficient documentation

## 2020-01-14 DIAGNOSIS — Z01818 Encounter for other preprocedural examination: Secondary | ICD-10-CM | POA: Insufficient documentation

## 2020-01-14 DIAGNOSIS — Z20822 Contact with and (suspected) exposure to covid-19: Secondary | ICD-10-CM | POA: Diagnosis not present

## 2020-01-14 DIAGNOSIS — E119 Type 2 diabetes mellitus without complications: Secondary | ICD-10-CM | POA: Insufficient documentation

## 2020-01-14 LAB — BASIC METABOLIC PANEL
Anion gap: 9 (ref 5–15)
BUN: 23 mg/dL (ref 8–23)
CO2: 27 mmol/L (ref 22–32)
Calcium: 10.6 mg/dL — ABNORMAL HIGH (ref 8.9–10.3)
Chloride: 106 mmol/L (ref 98–111)
Creatinine, Ser: 1.19 mg/dL (ref 0.61–1.24)
GFR, Estimated: >60 mL/min (ref >60)
Glucose, Bld: 112 mg/dL — ABNORMAL HIGH (ref 70–99)
Potassium: 3.4 mmol/L — ABNORMAL LOW (ref 3.5–5.1)
Sodium: 142 mmol/L (ref 135–145)

## 2020-01-14 LAB — CBC
HCT: 44.2 % (ref 39.0–52.0)
Hemoglobin: 15.5 g/dL (ref 13.0–17.0)
MCH: 31.1 pg (ref 26.0–34.0)
MCHC: 35.1 g/dL (ref 30.0–36.0)
MCV: 88.6 fL (ref 80.0–100.0)
Platelets: 177 10*3/uL (ref 150–400)
RBC: 4.99 MIL/uL (ref 4.22–5.81)
RDW: 13.8 % (ref 11.5–15.5)
WBC: 5.4 10*3/uL (ref 4.0–10.5)
nRBC: 0 % (ref 0.0–0.2)

## 2020-01-15 LAB — HEMOGLOBIN A1C
Hgb A1c MFr Bld: 7.2 % — ABNORMAL HIGH (ref 4.8–5.6)
Mean Plasma Glucose: 160 mg/dL

## 2020-01-15 LAB — SARS CORONAVIRUS 2 (TAT 6-24 HRS): SARS Coronavirus 2: NEGATIVE

## 2020-01-18 ENCOUNTER — Inpatient Hospital Stay
Admission: RE | Admit: 2020-01-18 | Discharge: 2020-01-19 | DRG: 983 | Disposition: A | Discharge status: Home / Self Care | Payer: Medicare Other | Attending: Surgery | Admitting: Surgery

## 2020-01-18 ENCOUNTER — Other Ambulatory Visit: Payer: Self-pay

## 2020-01-18 ENCOUNTER — Inpatient Hospital Stay: Payer: Medicare Other

## 2020-01-18 ENCOUNTER — Encounter
Admission: RE | Disposition: A | Discharge status: Home / Self Care | Payer: Self-pay | Source: Home / Self Care | Attending: Surgery

## 2020-01-18 ENCOUNTER — Encounter: Payer: Self-pay | Admitting: Surgery

## 2020-01-18 DIAGNOSIS — E119 Type 2 diabetes mellitus without complications: Secondary | ICD-10-CM | POA: Diagnosis present

## 2020-01-18 DIAGNOSIS — N400 Enlarged prostate without lower urinary tract symptoms: Principal | ICD-10-CM | POA: Diagnosis present

## 2020-01-18 DIAGNOSIS — R1011 Right upper quadrant pain: Secondary | ICD-10-CM | POA: Diagnosis present

## 2020-01-18 DIAGNOSIS — Z809 Family history of malignant neoplasm, unspecified: Secondary | ICD-10-CM | POA: Diagnosis not present

## 2020-01-18 DIAGNOSIS — Z23 Encounter for immunization: Secondary | ICD-10-CM

## 2020-01-18 DIAGNOSIS — Z8249 Family history of ischemic heart disease and other diseases of the circulatory system: Secondary | ICD-10-CM | POA: Diagnosis not present

## 2020-01-18 DIAGNOSIS — Z9889 Other specified postprocedural states: Secondary | ICD-10-CM

## 2020-01-18 DIAGNOSIS — I10 Essential (primary) hypertension: Secondary | ICD-10-CM | POA: Diagnosis present

## 2020-01-18 DIAGNOSIS — K449 Diaphragmatic hernia without obstruction or gangrene: Secondary | ICD-10-CM | POA: Diagnosis present

## 2020-01-18 DIAGNOSIS — Z87891 Personal history of nicotine dependence: Secondary | ICD-10-CM

## 2020-01-18 DIAGNOSIS — K219 Gastro-esophageal reflux disease without esophagitis: Secondary | ICD-10-CM | POA: Diagnosis present

## 2020-01-18 DIAGNOSIS — Z8719 Personal history of other diseases of the digestive system: Secondary | ICD-10-CM

## 2020-01-18 DIAGNOSIS — E785 Hyperlipidemia, unspecified: Secondary | ICD-10-CM | POA: Diagnosis present

## 2020-01-18 DIAGNOSIS — Z419 Encounter for procedure for purposes other than remedying health state, unspecified: Secondary | ICD-10-CM

## 2020-01-18 LAB — CBC
HCT: 43.1 % (ref 39.0–52.0)
Hemoglobin: 15.2 g/dL (ref 13.0–17.0)
MCH: 31.5 pg (ref 26.0–34.0)
MCHC: 35.3 g/dL (ref 30.0–36.0)
MCV: 89.4 fL (ref 80.0–100.0)
Platelets: 143 10*3/uL — ABNORMAL LOW (ref 150–400)
RBC: 4.82 MIL/uL (ref 4.22–5.81)
RDW: 13.9 % (ref 11.5–15.5)
WBC: 11 10*3/uL — ABNORMAL HIGH (ref 4.0–10.5)
nRBC: 0 % (ref 0.0–0.2)

## 2020-01-18 LAB — GLUCOSE, CAPILLARY
Glucose-Capillary: 171 mg/dL — ABNORMAL HIGH (ref 70–99)
Glucose-Capillary: 184 mg/dL — ABNORMAL HIGH (ref 70–99)
Glucose-Capillary: 233 mg/dL — ABNORMAL HIGH (ref 70–99)
Glucose-Capillary: 160 mg/dL — ABNORMAL HIGH (ref 70–99)

## 2020-01-18 LAB — CREATININE, SERUM
Creatinine, Ser: 1.2 mg/dL (ref 0.61–1.24)
GFR, Estimated: >60 mL/min (ref >60)

## 2020-01-18 SURGERY — FUNDOPLICATION, NISSEN, ROBOT-ASSISTED, LAPAROSCOPIC
Anesthesia: General

## 2020-01-18 MED ORDER — DEXAMETHASONE SODIUM PHOSPHATE 10 MG/ML IJ SOLN
INTRAMUSCULAR | Status: AC
  Filled 2020-01-18: qty 1

## 2020-01-18 MED ORDER — PROCHLORPERAZINE MALEATE 10 MG PO TABS
10.0000 mg | ORAL_TABLET | Freq: Four times a day (QID) | ORAL | Status: DC | PRN
  Filled 2020-01-18: qty 1

## 2020-01-18 MED ORDER — FENTANYL CITRATE (PF) 100 MCG/2ML IJ SOLN
INTRAMUSCULAR | Status: DC | PRN
  Administered 2020-01-18 (×2): 50 ug via INTRAVENOUS

## 2020-01-18 MED ORDER — KETOROLAC TROMETHAMINE 15 MG/ML IJ SOLN
15.0000 mg | Freq: Four times a day (QID) | INTRAMUSCULAR | Status: DC
  Administered 2020-01-18 – 2020-01-19 (×3): 15 mg via INTRAVENOUS
  Filled 2020-01-18 (×3): qty 1

## 2020-01-18 MED ORDER — ONDANSETRON HCL 4 MG/2ML IJ SOLN
INTRAMUSCULAR | Status: DC | PRN
  Administered 2020-01-18: 4 mg via INTRAVENOUS

## 2020-01-18 MED ORDER — ACETAMINOPHEN 500 MG PO TABS
ORAL_TABLET | ORAL | Status: AC
  Administered 2020-01-18: 1000 mg via ORAL
  Filled 2020-01-18: qty 2

## 2020-01-18 MED ORDER — LIDOCAINE HCL (CARDIAC) PF 100 MG/5ML IV SOSY
PREFILLED_SYRINGE | INTRAVENOUS | Status: DC | PRN
  Administered 2020-01-18: 100 mg via INTRAVENOUS

## 2020-01-18 MED ORDER — DEXAMETHASONE SODIUM PHOSPHATE 10 MG/ML IJ SOLN
INTRAMUSCULAR | Status: DC | PRN
  Administered 2020-01-18: 8 mg via INTRAVENOUS

## 2020-01-18 MED ORDER — SUCCINYLCHOLINE CHLORIDE 200 MG/10ML IV SOSY
PREFILLED_SYRINGE | INTRAVENOUS | Status: AC
  Filled 2020-01-18: qty 10

## 2020-01-18 MED ORDER — PRAVASTATIN SODIUM 20 MG PO TABS
10.0000 mg | ORAL_TABLET | Freq: Every day | ORAL | Status: DC
  Administered 2020-01-18: 10 mg via ORAL
  Filled 2020-01-18: qty 1

## 2020-01-18 MED ORDER — HYDROMORPHONE HCL 1 MG/ML IJ SOLN
INTRAMUSCULAR | Status: AC
  Filled 2020-01-18: qty 1

## 2020-01-18 MED ORDER — CHLORHEXIDINE GLUCONATE CLOTH 2 % EX PADS: 6.0000 | MEDICATED_PAD | Freq: Once | CUTANEOUS | Status: DC

## 2020-01-18 MED ORDER — EPHEDRINE SULFATE 50 MG/ML IJ SOLN
INTRAMUSCULAR | Status: DC | PRN
  Administered 2020-01-18 (×2): 10 mg via INTRAVENOUS
  Administered 2020-01-18: 15 mg via INTRAVENOUS

## 2020-01-18 MED ORDER — FENTANYL CITRATE (PF) 100 MCG/2ML IJ SOLN
INTRAMUSCULAR | Status: AC
  Administered 2020-01-18: 25 ug via INTRAVENOUS
  Filled 2020-01-18: qty 2

## 2020-01-18 MED ORDER — METOPROLOL TARTRATE 50 MG PO TABS
100.0000 mg | ORAL_TABLET | Freq: Two times a day (BID) | ORAL | Status: DC
  Administered 2020-01-18 – 2020-01-19 (×2): 100 mg via ORAL
  Filled 2020-01-18 (×2): qty 2

## 2020-01-18 MED ORDER — GABAPENTIN 300 MG PO CAPS
ORAL_CAPSULE | ORAL | Status: AC
  Administered 2020-01-18: 300 mg via ORAL
  Filled 2020-01-18: qty 1

## 2020-01-18 MED ORDER — LACTATED RINGERS IV SOLN: INTRAVENOUS | Status: DC | PRN

## 2020-01-18 MED ORDER — FENTANYL CITRATE (PF) 100 MCG/2ML IJ SOLN
25.0000 ug | INTRAMUSCULAR | Status: DC | PRN
  Administered 2020-01-18 (×2): 25 ug via INTRAVENOUS

## 2020-01-18 MED ORDER — ACETAMINOPHEN 500 MG PO TABS: 1000.0000 mg | ORAL_TABLET | Freq: Four times a day (QID) | ORAL | Status: DC

## 2020-01-18 MED ORDER — PROMETHAZINE HCL 25 MG/ML IJ SOLN: 6.2500 mg | INTRAMUSCULAR | Status: DC | PRN

## 2020-01-18 MED ORDER — ACETAMINOPHEN 500 MG PO TABS: 1000.0000 mg | ORAL_TABLET | ORAL | Status: AC

## 2020-01-18 MED ORDER — ROCURONIUM BROMIDE 10 MG/ML (PF) SYRINGE
PREFILLED_SYRINGE | INTRAVENOUS | Status: AC
  Filled 2020-01-18: qty 10

## 2020-01-18 MED ORDER — KETOROLAC TROMETHAMINE 15 MG/ML IJ SOLN
INTRAMUSCULAR | Status: AC
  Administered 2020-01-18: 15 mg
  Filled 2020-01-18: qty 1

## 2020-01-18 MED ORDER — SODIUM CHLORIDE 0.9 % IV SOLN: INTRAVENOUS | Status: DC

## 2020-01-18 MED ORDER — HEPARIN SODIUM (PORCINE) 5000 UNIT/ML IJ SOLN
INTRAMUSCULAR | Status: AC
  Administered 2020-01-18: 5000 [IU] via SUBCUTANEOUS
  Filled 2020-01-18: qty 1

## 2020-01-18 MED ORDER — MIDAZOLAM HCL 2 MG/2ML IJ SOLN
INTRAMUSCULAR | Status: DC | PRN
  Administered 2020-01-18: 1 mg via INTRAVENOUS

## 2020-01-18 MED ORDER — SUGAMMADEX SODIUM 200 MG/2ML IV SOLN
INTRAVENOUS | Status: DC | PRN
  Administered 2020-01-18: 200 mg via INTRAVENOUS

## 2020-01-18 MED ORDER — CHLORHEXIDINE GLUCONATE 0.12 % MT SOLN
OROMUCOSAL | Status: AC
  Administered 2020-01-18: 15 mL via OROMUCOSAL
  Filled 2020-01-18: qty 15

## 2020-01-18 MED ORDER — KETOROLAC TROMETHAMINE 15 MG/ML IJ SOLN: 15.0000 mg | Freq: Four times a day (QID) | INTRAMUSCULAR | Status: DC

## 2020-01-18 MED ORDER — CELECOXIB 200 MG PO CAPS
ORAL_CAPSULE | ORAL | Status: AC
  Administered 2020-01-18: 200 mg via ORAL
  Filled 2020-01-18: qty 1

## 2020-01-18 MED ORDER — GABAPENTIN 300 MG PO CAPS: 300.0000 mg | ORAL_CAPSULE | ORAL | Status: AC

## 2020-01-18 MED ORDER — MORPHINE SULFATE (PF) 2 MG/ML IV SOLN: 2.0000 mg | INTRAVENOUS | Status: DC | PRN

## 2020-01-18 MED ORDER — MIDAZOLAM HCL 2 MG/2ML IJ SOLN
INTRAMUSCULAR | Status: AC
  Filled 2020-01-18: qty 2

## 2020-01-18 MED ORDER — SODIUM CHLORIDE 0.9 % IV SOLN
INTRAVENOUS | Status: DC | PRN
  Administered 2020-01-18: 20 ug/min via INTRAVENOUS

## 2020-01-18 MED ORDER — HYDROMORPHONE HCL 1 MG/ML IJ SOLN
INTRAMUSCULAR | Status: DC | PRN
  Administered 2020-01-18: .5 mg via INTRAVENOUS

## 2020-01-18 MED ORDER — SODIUM CHLORIDE 0.9 % IV SOLN
2.0000 g | INTRAVENOUS | Status: AC
  Administered 2020-01-18: 2 g via INTRAVENOUS

## 2020-01-18 MED ORDER — ONDANSETRON HCL 4 MG/2ML IJ SOLN: 4.0000 mg | Freq: Four times a day (QID) | INTRAMUSCULAR | Status: DC | PRN

## 2020-01-18 MED ORDER — ONDANSETRON 4 MG PO TBDP: 4.0000 mg | ORAL_TABLET | Freq: Four times a day (QID) | ORAL | Status: DC | PRN

## 2020-01-18 MED ORDER — OXYCODONE HCL 5 MG/5ML PO SOLN: 5.0000 mg | Freq: Once | ORAL | Status: DC | PRN

## 2020-01-18 MED ORDER — BUPIVACAINE-EPINEPHRINE 0.25% -1:200000 IJ SOLN
INTRAMUSCULAR | Status: DC | PRN
  Administered 2020-01-18: 30 mL

## 2020-01-18 MED ORDER — PNEUMOCOCCAL VAC POLYVALENT 25 MCG/0.5ML IJ INJ
0.5000 mL | INJECTION | INTRAMUSCULAR | Status: AC
  Administered 2020-01-19: 0.5 mL via INTRAMUSCULAR
  Filled 2020-01-18: qty 0.5

## 2020-01-18 MED ORDER — INSULIN ASPART 100 UNIT/ML ~~LOC~~ SOLN
0.0000 [IU] | Freq: Three times a day (TID) | SUBCUTANEOUS | Status: DC
  Administered 2020-01-18: 7 [IU] via SUBCUTANEOUS
  Administered 2020-01-19: 3 [IU] via SUBCUTANEOUS
  Filled 2020-01-18 (×2): qty 1

## 2020-01-18 MED ORDER — EPHEDRINE 5 MG/ML INJ
INTRAVENOUS | Status: AC
  Filled 2020-01-18: qty 10

## 2020-01-18 MED ORDER — PHENYLEPHRINE HCL (PRESSORS) 10 MG/ML IV SOLN
INTRAVENOUS | Status: AC
  Filled 2020-01-18: qty 1

## 2020-01-18 MED ORDER — SODIUM CHLORIDE 0.9 % IV SOLN
INTRAVENOUS | Status: AC
  Filled 2020-01-18: qty 2

## 2020-01-18 MED ORDER — INSULIN ASPART 100 UNIT/ML ~~LOC~~ SOLN
4.0000 [IU] | Freq: Three times a day (TID) | SUBCUTANEOUS | Status: DC
  Administered 2020-01-18 – 2020-01-19 (×3): 4 [IU] via SUBCUTANEOUS
  Filled 2020-01-18 (×3): qty 1

## 2020-01-18 MED ORDER — ORAL CARE MOUTH RINSE: 15.0000 mL | Freq: Once | OROMUCOSAL | Status: AC

## 2020-01-18 MED ORDER — LIDOCAINE HCL 4 % MT SOLN
OROMUCOSAL | Status: DC | PRN
  Administered 2020-01-18: 4 mL via TOPICAL

## 2020-01-18 MED ORDER — FENTANYL CITRATE (PF) 100 MCG/2ML IJ SOLN
INTRAMUSCULAR | Status: AC
  Filled 2020-01-18: qty 2

## 2020-01-18 MED ORDER — ENOXAPARIN SODIUM 40 MG/0.4ML ~~LOC~~ SOLN
40.0000 mg | SUBCUTANEOUS | Status: DC
  Administered 2020-01-19: 40 mg via SUBCUTANEOUS
  Filled 2020-01-18: qty 0.4

## 2020-01-18 MED ORDER — ROCURONIUM BROMIDE 100 MG/10ML IV SOLN
INTRAVENOUS | Status: DC | PRN
  Administered 2020-01-18: 50 mg via INTRAVENOUS
  Administered 2020-01-18: 15 mg via INTRAVENOUS
  Administered 2020-01-18 (×2): 20 mg via INTRAVENOUS
  Administered 2020-01-18: 10 mg via INTRAVENOUS

## 2020-01-18 MED ORDER — LIDOCAINE HCL (PF) 2 % IJ SOLN
INTRAMUSCULAR | Status: AC
  Filled 2020-01-18: qty 5

## 2020-01-18 MED ORDER — CELECOXIB 200 MG PO CAPS: 200.0000 mg | ORAL_CAPSULE | ORAL | Status: AC

## 2020-01-18 MED ORDER — BUPIVACAINE LIPOSOME 1.3 % IJ SUSP
INTRAMUSCULAR | Status: DC | PRN
  Administered 2020-01-18: 20 mL

## 2020-01-18 MED ORDER — SEVOFLURANE IN SOLN
RESPIRATORY_TRACT | Status: AC
  Filled 2020-01-18: qty 250

## 2020-01-18 MED ORDER — BUPIVACAINE-EPINEPHRINE (PF) 0.25% -1:200000 IJ SOLN
INTRAMUSCULAR | Status: AC
  Filled 2020-01-18: qty 30

## 2020-01-18 MED ORDER — PROPOFOL 10 MG/ML IV BOLUS
INTRAVENOUS | Status: DC | PRN
  Administered 2020-01-18: 40 mg via INTRAVENOUS
  Administered 2020-01-18: 160 mg via INTRAVENOUS

## 2020-01-18 MED ORDER — MEPERIDINE HCL 50 MG/ML IJ SOLN: 6.2500 mg | INTRAMUSCULAR | Status: DC | PRN

## 2020-01-18 MED ORDER — BUPIVACAINE LIPOSOME 1.3 % IJ SUSP
INTRAMUSCULAR | Status: AC
  Filled 2020-01-18: qty 20

## 2020-01-18 MED ORDER — ONDANSETRON HCL 4 MG/2ML IJ SOLN
INTRAMUSCULAR | Status: AC
  Filled 2020-01-18: qty 2

## 2020-01-18 MED ORDER — ACETAMINOPHEN 500 MG PO TABS
1000.0000 mg | ORAL_TABLET | Freq: Four times a day (QID) | ORAL | Status: DC
  Administered 2020-01-18 – 2020-01-19 (×3): 1000 mg via ORAL
  Filled 2020-01-18 (×3): qty 2

## 2020-01-18 MED ORDER — PROCHLORPERAZINE EDISYLATE 10 MG/2ML IJ SOLN: 5.0000 mg | Freq: Four times a day (QID) | INTRAMUSCULAR | Status: DC | PRN

## 2020-01-18 MED ORDER — OXYCODONE HCL 5 MG PO TABS
5.0000 mg | ORAL_TABLET | ORAL | Status: DC | PRN
  Administered 2020-01-18 (×2): 5 mg via ORAL
  Filled 2020-01-18 (×2): qty 1

## 2020-01-18 MED ORDER — KETOROLAC TROMETHAMINE 15 MG/ML IJ SOLN
15.0000 mg | Freq: Four times a day (QID) | INTRAMUSCULAR | Status: DC
  Administered 2020-01-18: 15 mg via INTRAVENOUS

## 2020-01-18 MED ORDER — OXYCODONE HCL 5 MG PO TABS: 5.0000 mg | ORAL_TABLET | Freq: Once | ORAL | Status: DC | PRN

## 2020-01-18 MED ORDER — HEPARIN SODIUM (PORCINE) 5000 UNIT/ML IJ SOLN: 5000.0000 [IU] | Freq: Once | INTRAMUSCULAR | Status: AC

## 2020-01-18 MED ORDER — CHLORHEXIDINE GLUCONATE 0.12 % MT SOLN: 15.0000 mL | Freq: Once | OROMUCOSAL | Status: AC

## 2020-01-18 SURGICAL SUPPLY — 55 items
"PENCIL ELECTRO HAND CTR " (MISCELLANEOUS) ×1 IMPLANT
CANISTER SUCT 1200ML W/VALVE (MISCELLANEOUS) ×2 IMPLANT
CANNULA REDUC XI 12-8 STAPL (CANNULA) ×1
CANNULA REDUCER 12-8 DVNC XI (CANNULA) ×1 IMPLANT
CHLORAPREP W/TINT 26 (MISCELLANEOUS) ×2 IMPLANT
COVER WAND RF STERILE (DRAPES) ×2 IMPLANT
DECANTER SPIKE VIAL GLASS SM (MISCELLANEOUS) ×2 IMPLANT
DEFOGGER SCOPE WARMER CLEARIFY (MISCELLANEOUS) ×2 IMPLANT
DERMABOND ADVANCED (GAUZE/BANDAGES/DRESSINGS) ×1
DERMABOND ADVANCED .7 DNX12 (GAUZE/BANDAGES/DRESSINGS) ×1 IMPLANT
DRAPE 3/4 80X56 (DRAPES) ×1 IMPLANT
DRAPE ARM DVNC X/XI (DISPOSABLE) ×4 IMPLANT
DRAPE COLUMN DVNC XI (DISPOSABLE) ×1 IMPLANT
DRAPE DA VINCI XI ARM (DISPOSABLE) ×4
DRAPE DA VINCI XI COLUMN (DISPOSABLE) ×1
ELECT CAUTERY BLADE 6.4 (BLADE) ×2 IMPLANT
ELECT REM PT RETURN 9FT ADLT (ELECTROSURGICAL) ×2
ELECTRODE REM PT RTRN 9FT ADLT (ELECTROSURGICAL) ×1 IMPLANT
GLOVE BIO SURGEON STRL SZ7 (GLOVE) ×11 IMPLANT
GOWN STRL REUS W/ TWL LRG LVL3 (GOWN DISPOSABLE) ×4 IMPLANT
GOWN STRL REUS W/TWL LRG LVL3 (GOWN DISPOSABLE) ×4
GRASPER LAPSCPC 5X45 DSP (INSTRUMENTS) ×2 IMPLANT
IRRIGATION STRYKERFLOW (MISCELLANEOUS) IMPLANT
IRRIGATOR STRYKERFLOW (MISCELLANEOUS) ×2
IV NS 1000ML (IV SOLUTION) ×1
IV NS 1000ML BAXH (IV SOLUTION) IMPLANT
KIT PINK PAD W/HEAD ARE REST (MISCELLANEOUS) ×2
KIT PINK PAD W/HEAD ARM REST (MISCELLANEOUS) ×1 IMPLANT
KIT TURNOVER CYSTO (KITS) ×2 IMPLANT
LABEL OR SOLS (LABEL) ×2 IMPLANT
MESH BIO-A 7X10 SYN MAT (Mesh General) ×1 IMPLANT
NEEDLE HYPO 22GX1.5 SAFETY (NEEDLE) ×2 IMPLANT
OBTURATOR OPTICAL STANDARD 8MM (TROCAR) ×1
OBTURATOR OPTICAL STND 8 DVNC (TROCAR) ×1
OBTURATOR OPTICALSTD 8 DVNC (TROCAR) ×1 IMPLANT
PACK LAP CHOLECYSTECTOMY (MISCELLANEOUS) ×2 IMPLANT
PENCIL ELECTRO HAND CTR (MISCELLANEOUS) ×2 IMPLANT
SEAL CANN UNIV 5-8 DVNC XI (MISCELLANEOUS) ×3 IMPLANT
SEAL XI 5MM-8MM UNIVERSAL (MISCELLANEOUS) ×3
SEALER VESSEL DA VINCI XI (MISCELLANEOUS) ×1
SEALER VESSEL EXT DVNC XI (MISCELLANEOUS) ×1 IMPLANT
SOLUTION ELECTROLUBE (MISCELLANEOUS) ×2 IMPLANT
SPONGE LAP 18X18 RF (DISPOSABLE) ×2 IMPLANT
STAPLER CANNULA SEAL DVNC XI (STAPLE) ×1 IMPLANT
STAPLER CANNULA SEAL XI (STAPLE) ×1
SUT MNCRL 4-0 (SUTURE) ×1
SUT MNCRL 4-0 27XMFL (SUTURE) ×1
SUT SILK 2 0 SH (SUTURE) ×4 IMPLANT
SUT VICRYL 0 AB UR-6 (SUTURE) ×3 IMPLANT
SUT VLOC 90 S/L VL9 GS22 (SUTURE) ×2 IMPLANT
SUTURE MNCRL 4-0 27XMF (SUTURE) ×1 IMPLANT
TRAY FOLEY SLVR 16FR LF STAT (SET/KITS/TRAYS/PACK) ×4 IMPLANT
TROCAR BALLN GELPORT 12X130M (ENDOMECHANICALS) ×2 IMPLANT
TROCAR XCEL NON-BLD 5MMX100MML (ENDOMECHANICALS) ×2 IMPLANT
TUBING EVAC SMOKE HEATED PNEUM (TUBING) ×2 IMPLANT

## 2020-01-18 NOTE — Anesthesia Preprocedure Evaluation (Signed)
Anesthesia Evaluation  Patient identified by MRN, date of birth, ID band Patient awake    Reviewed: Allergy & Precautions, NPO status , Patient's Chart, lab work & pertinent test results  History of Anesthesia Complications Negative for: history of anesthetic complications  Airway Mallampati: III  TM Distance: >3 FB Neck ROM: Full    Dental  (+) Edentulous Upper, Edentulous Lower   Pulmonary neg sleep apnea, neg COPD, former smoker,    breath sounds clear to auscultation- rhonchi (-) wheezing      Cardiovascular hypertension, Pt. on medications (-) CAD, (-) Past MI, (-) Cardiac Stents and (-) CABG  Rhythm:Regular Rate:Normal - Systolic murmurs and - Diastolic murmurs    Neuro/Psych neg Seizures Anxiety negative neurological ROS     GI/Hepatic Neg liver ROS, hiatal hernia, GERD  ,  Endo/Other  diabetes, Oral Hypoglycemic Agents  Renal/GU Renal InsufficiencyRenal disease     Musculoskeletal negative musculoskeletal ROS (+)   Abdominal (+) + obese,   Peds  Hematology  (+) anemia ,   Anesthesia Other Findings Past Medical History: No date: Abdominal hernia No date: Anemia No date: Anxiety No date: Balanitis No date: Balanitis No date: BPH (benign prostatic hyperplasia) No date: Diabetes (HCC) No date: Erectile dysfunction No date: GERD (gastroesophageal reflux disease) No date: History of kidney stones No date: HLD (hyperlipidemia) No date: HTN (hypertension) 09/04/2017: Iron deficiency anemia due to chronic blood loss No date: Over weight No date: Priapism No date: Scrotal cyst No date: Urinary urgency   Reproductive/Obstetrics                             Anesthesia Physical Anesthesia Plan  ASA: III  Anesthesia Plan: General   Post-op Pain Management:    Induction: Intravenous  PONV Risk Score and Plan: 1 and Ondansetron and Dexamethasone  Airway Management Planned:  Oral ETT  Additional Equipment:   Intra-op Plan:   Post-operative Plan: Extubation in OR  Informed Consent: I have reviewed the patients History and Physical, chart, labs and discussed the procedure including the risks, benefits and alternatives for the proposed anesthesia with the patient or authorized representative who has indicated his/her understanding and acceptance.     Dental advisory given  Plan Discussed with: CRNA and Anesthesiologist  Anesthesia Plan Comments:         Anesthesia Quick Evaluation

## 2020-01-18 NOTE — Interval H&P Note (Signed)
History and Physical Interval Note:  01/18/2020 7:18 AM  Brandon Benjamin  has presented today for surgery, with the diagnosis of Hiatal hernia.  The various methods of treatment have been discussed with the patient and family. After consideration of risks, benefits and other options for treatment, the patient has consented to  Procedure(s): XI ROBOTIC ASSISTED LAPAROSCOPIC NISSEN FUNDOPLICATION, hiatal hernia repair (N/A) as a surgical intervention.  The patient's history has been reviewed, patient examined, no change in status, stable for surgery.  I have reviewed the patient's chart and labs.  Questions were answered to the patient's satisfaction.     [place]

## 2020-01-18 NOTE — Anesthesia Postprocedure Evaluation (Signed)
Anesthesia Post Note  Patient: Antario Yasuda Drew  Procedure(s) Performed: XI ROBOTIC ASSISTED LAPAROSCOPIC NISSEN FUNDOPLICATION, hiatal hernia repair (N/A )  Patient location during evaluation: PACU Anesthesia Type: General Level of consciousness: awake and alert and oriented Pain management: pain level controlled Vital Signs Assessment: post-procedure vital signs reviewed and stable Respiratory status: spontaneous breathing, nonlabored ventilation and respiratory function stable Cardiovascular status: blood pressure returned to baseline and stable Postop Assessment: no signs of nausea or vomiting Anesthetic complications: no   No complications documented.   Last Vitals:  Vitals:   01/18/20 1217 01/18/20 1232  BP: 135/84 132/84  Pulse: 72 73  Resp: 20 20  Temp:    SpO2: 97% 97%    Last Pain:  Vitals:   01/18/20 1219  TempSrc:   PainSc: 6                  Adanely Reynoso

## 2020-01-18 NOTE — Anesthesia Procedure Notes (Signed)
Procedure Name: Intubation Date/Time: 01/18/2020 7:41 AM Performed by: Jones, Angel R, RN Pre-anesthesia Checklist: Patient identified, Emergency Drugs available, Suction available and Patient being monitored Patient Re-evaluated:Patient Re-evaluated prior to induction Oxygen Delivery Method: Circle system utilized Preoxygenation: Pre-oxygenation with 100% oxygen Induction Type: IV induction Ventilation: Mask ventilation without difficulty and Oral airway inserted - appropriate to patient size Laryngoscope Size: Mac and 4 Grade View: Grade I Tube type: Oral Tube size: 7.5 mm Number of attempts: 1 Airway Equipment and Method: Stylet,  Oral airway and LTA kit utilized Placement Confirmation: ETT inserted through vocal cords under direct vision,  positive ETCO2 and breath sounds checked- equal and bilateral Secured at: 24 cm Tube secured with: Tape Dental Injury: Teeth and Oropharynx as per pre-operative assessment        

## 2020-01-18 NOTE — Progress Notes (Signed)
   01/18/20 0730  Clinical Encounter Type  Visited With Family  Visit Type Initial  Referral From Chaplain  Consult/Referral To Chaplain  While rounding [facility] waiting area, chaplain briefly spoke with Pt's wife to find out how she was doing while waiting. She said she was fine.

## 2020-01-18 NOTE — Transfer of Care (Signed)
Immediate Anesthesia Transfer of Care Note  Patient: Brandon Benjamin  Procedure(s) Performed: XI ROBOTIC ASSISTED LAPAROSCOPIC NISSEN FUNDOPLICATION, hiatal hernia repair (N/A )  Patient Location: PACU  Anesthesia Type:General  Level of Consciousness: drowsy  Airway & Oxygen Therapy: Patient Spontanous Breathing and Patient connected to face mask oxygen  Post-op Assessment: Report given to RN and Post -op Vital signs reviewed and stable  Post vital signs: Reviewed and stable  Last Vitals:  Vitals Value Taken Time  BP 139/97 01/18/20 1117  Temp 36.7 C 01/18/20 1117  Pulse 84 01/18/20 1119  Resp 18 01/18/20 1119  SpO2 99 % 01/18/20 1119  Vitals shown include unvalidated device data.  Last Pain:  Vitals:   01/18/20 0639  TempSrc: Oral  PainSc: 0-No pain         Complications: No complications documented.

## 2020-01-18 NOTE — Op Note (Addendum)
Robotic assisted laparoscopic Nissen fundoplication w repair of  Paraesophageal  hernia  Pre-operative Diagnosis: GERD, paraesophageal Type III hernia  Post-operative Diagnosis: same  Procedure:  Robotic assisted laparoscopic Nissen fundoplication w repair of  Paraesophageal hernia using Mesh 10x7cms bio-a  Surgeon: Caroleen Hamman, MD FACS  Assistant: Dr. Genevive Bi. Required due to the complexity of the case the need for exposure, surgical knowledge, mediastinal dissection and lack of first assist.  Anesthesia: Gen. with endotracheal tube  Findings: Large Type III paraesophageal hernia with 1/3 of the stomach within themediastinum Loose wrap 360 degree over 52 FR Bougie   Estimated Blood Loss: 44BE       Complications: none   Procedure Details  The patient was seen again in the Holding Room. The benefits, complications, treatment options, and expected outcomes were discussed with the patient. The risks of bleeding, infection, recurrence of symptoms, failure to resolve symptoms,  esophageal damage, Dysphagia, bowel injury, any of which could require further surgery were reviewed with the patient. The likelihood of improving the patient's symptoms with return to their baseline status is good.  The patient and/or family concurred with the proposed plan, giving informed consent.  The patient was taken to Operating Room, identified  and the procedure verified.  A Time Out was held and the above information confirmed.  Prior to the induction of general anesthesia, antibiotic prophylaxis was administered. VTE prophylaxis was in place. General endotracheal anesthesia was then administered and tolerated well. After the induction, the abdomen was prepped with Chloraprep and draped in the sterile fashion. The patient was positioned in the supine position.  Cut down technique was used to enter the abdominal cavity and a Hasson trochar was placed after two vicryl stitches were anchored to the fascia.  Pneumoperitoneum was then created with CO2 and tolerated well without any adverse changes in the patient's vital signs.  Three 8-mm ports were placed under direct vision. All skin incisions  were infiltrated with a local anesthetic agent before making the incision and placing the trocars. An additional 5 mm regular laparoscopic port was placed to assist with retraction and exposure.   The patient was positioned  in reverse Trendelenburg, robot was brought to the surgical field and docked in the standard fashion.  We made sure all the instrumentation was kept indirect view at all times and that there were no collision between the arms. I scrubbed out and went to the console.  I used a robotic arm to retract the liver, the vessel sealer on my right hand and a forced bipolar grasper on my left hand.  There is along the extra 5 mm port allow me ample exposure and the ability to perform meticulous dissection  We Started dividing the lesser omentum via the pars flaccida.  We Were able to dissect the lesser curvature of the stomach and  dissected the fundus free from the right and left crus.  We circumferentially dissected the GE junction.  The hernia sac was also completely reduced and we were able to bring the stomach into the intra-abdominal position. This was a large hernia requiring meticulous and patient dissection.  Attention then was turned to the greater curvature where the short gastrics were divided with sealer device.  We were able to identify the left crus and again were able to make sure there was a good circumferential dissection and that the hernia sac was completely excised.  We did perform also some dissection within the mediastinum to allow a complete reduction of the sac  and a to completely allow an intra-abdominal Nissen fundoplication.  2-0V lock suture was inserted and the crus as well as the hernia was closed with a running suture.Please note that after I closed the left and right crus I  noticed malfunction of the robotic needle holder, on close inspection we noticed that one of  the metal wires within the tip of the instruments broke. It did not cause injuries and there were no retained FB. I took a picture and place it on the medical record. Given the large defect I reinforced the repair with a 10x7cms bio-a mesh, It layed very nicely towards the crus on both sides.  We Asked anesthesia to place a 52 French bougie and this went easily.  We also observe trajectory of the bougie. 360 degree Nissen fundoplication was created with multiple 2-0 silk sutures and we placed 3 stitches taking some of the esophagus within that bite.  The fundoplication measured approximately 3-1/2 cm and he was floppy. I was very happy with the way the fundoplication laid and the repair of the hernia.  Inspection of the  upper quadrant was performed. No bleeding, bile  Or esophageal injuries leaks, or bowel injuries were noted. Robotic instruments and robotic arms were undocked in the standard fashion. All the needles were removed under direct visualization.   I scrubbed back in.  Pneumoperitoneum was released.  The periumbilical port site was closed with interrumpted 0 Vicryl sutures. 4-0 subcuticular Monocryl was used to close the skin. Liposomal marcaine was injected to all the incisions sites.  Dermabond was  applied.  The patient was then extubated and brought to the recovery room in stable condition. Sponge, lap, and needle counts were correct at closure and at the conclusion of the case.               Caroleen Hamman, MD, FACS

## 2020-01-19 LAB — BASIC METABOLIC PANEL
Anion gap: 7 (ref 5–15)
BUN: 18 mg/dL (ref 8–23)
CO2: 28 mmol/L (ref 22–32)
Calcium: 9.4 mg/dL (ref 8.9–10.3)
Chloride: 104 mmol/L (ref 98–111)
Creatinine, Ser: 1.04 mg/dL (ref 0.61–1.24)
GFR, Estimated: >60 mL/min (ref >60)
Glucose, Bld: 139 mg/dL — ABNORMAL HIGH (ref 70–99)
Potassium: 3.1 mmol/L — ABNORMAL LOW (ref 3.5–5.1)
Sodium: 139 mmol/L (ref 135–145)

## 2020-01-19 LAB — CBC
HCT: 38.7 % — ABNORMAL LOW (ref 39.0–52.0)
Hemoglobin: 13.6 g/dL (ref 13.0–17.0)
MCH: 31.4 pg (ref 26.0–34.0)
MCHC: 35.1 g/dL (ref 30.0–36.0)
MCV: 89.4 fL (ref 80.0–100.0)
Platelets: 145 10*3/uL — ABNORMAL LOW (ref 150–400)
RBC: 4.33 MIL/uL (ref 4.22–5.81)
RDW: 14 % (ref 11.5–15.5)
WBC: 8.8 10*3/uL (ref 4.0–10.5)
nRBC: 0 % (ref 0.0–0.2)

## 2020-01-19 LAB — GLUCOSE, CAPILLARY
Glucose-Capillary: 133 mg/dL — ABNORMAL HIGH (ref 70–99)
Glucose-Capillary: 116 mg/dL — ABNORMAL HIGH (ref 70–99)

## 2020-01-19 MED ORDER — IBUPROFEN 800 MG PO TABS: 800.0000 mg | ORAL_TABLET | Freq: Three times a day (TID) | ORAL | 0 refills | Status: DC | PRN

## 2020-01-19 MED ORDER — OXYCODONE HCL 5 MG PO TABS: 5.0000 mg | ORAL_TABLET | Freq: Four times a day (QID) | ORAL | 0 refills | Status: DC | PRN

## 2020-01-19 NOTE — Clinical Social Work Note (Addendum)
CSW acknowledges consult that patient is requesting a rollator. Emailed [name] representative to see if PT will have to evaluate him before we order.  Dayton Scrape, CSW [phone]  10:49 am CSW met with patient. He stated he has a walker at home that he received within the last 5 years. CSW explained that insurance will not pay for a rollator unless he got the walker outside of that 5-year period. He does not want to private pay for a rollator. No further concerns. Patient has orders to discharge home today. CSW signing off.  Dayton Scrape, [place]

## 2020-01-19 NOTE — Progress Notes (Signed)
MD ordered patient to be discharged home.  Discharge instructions were reviewed with the patient and he voiced understanding.  Follow-up appointment was made.  Prescriptions sent to the patients pharmacy.  IV was removed with catheter intact.  All patients questions were answered.  Patient left via  Wheelchair escorted by auxillary.

## 2020-01-19 NOTE — Discharge Summary (Signed)
[facility] SURGICAL DISCHARGE SUMMARY  Patient ID: Brandon Benjamin MRN: [id number] DOB/AGE: 04/13/1945 74 y.o.  Admit date: 01/18/2020 Discharge date: 01/19/2020  Discharge Diagnoses Patient Active Problem List   Diagnosis Date Noted  . S/P repair of paraesophageal hernia 01/18/2020    Consultants None  Procedures 01/18/2020:  Robotic assisted laparoscopic Nissen fundoplication w repair of Paraesophageal hernia   HPI: Brandon Benjamin is a 74 y.o. male with a history of large hiatal hernia who [facility] to [facility] for schedule robotic assisted laparoscopic heiatal hernia repair with Dr Dahlia Byes.   Hospital Course: Informed consent was obtained and documented, and patient underwent uneventful Robotic assisted laparoscopic Nissen fundoplication w repair of  Paraesophageal  hernia (Dr Dahlia Byes, 01/18/2020).  Post-operatively, patient's improved/resolved and advancement of patient's diet and ambulation were well-tolerated. The remainder of patient's hospital course was essentially unremarkable, and discharge planning was initiated accordingly with patient safely able to be discharged home with appropriate discharge instructions, pain control, and outpatien follow-up after all of his questions were answered to his expressed satisfaction.   Discharge Condition: Good   Physical Examination:  Constitutional: Well appearing male, NAD Pulmonary: Normal effort, no respiratory distress Gastrointestinal: Soft, incisional soreness, non-distended, no rebound/guarding Skin: Laparoscopic incisions are CDI with dermabond, no erythema or drainage    Allergies as of 01/19/2020   No Known Allergies     Medication List    TAKE these medications   Accu-Chek Aviva Plus test strip Generic drug: glucose blood   Accu-Chek Softclix Lancets lancets   amLODipine 10 MG tablet Commonly known as: NORVASC Take 10 mg by mouth daily.   cetirizine 10 MG tablet Commonly known as:  ZYRTEC Take 10 mg by mouth daily.   citalopram 10 MG tablet Commonly known as: CELEXA Take 10 mg by mouth daily.   dicyclomine 10 MG capsule Commonly known as: BENTYL   docusate sodium 100 MG capsule Commonly known as: COLACE Take 100 mg by mouth 2 (two) times daily.   FIBER ADULT GUMMIES PO Take 2 tablets by mouth daily.   fluticasone 50 MCG/ACT nasal spray Commonly known as: FLONASE Place 2 sprays into both nostrils daily.   hydrALAZINE 25 MG tablet Commonly known as: APRESOLINE Take 25 mg by mouth 3 (three) times daily.   hydrochlorothiazide 25 MG tablet Commonly known as: HYDRODIURIL Take 25 mg by mouth daily.   ibuprofen 200 MG tablet Commonly known as: ADVIL Take 600 mg by mouth every 6 (six) hours as needed for moderate pain. What changed: Another medication with the same name was added. Make sure you understand how and when to take each.   ibuprofen 800 MG tablet Commonly known as: ADVIL Take 1 tablet (800 mg total) by mouth every 8 (eight) hours as needed. What changed: You were already taking a medication with the same name, and this prescription was added. Make sure you understand how and when to take each.   linaclotide 290 MCG Caps capsule Commonly known as: Linzess Take 1 capsule (290 mcg total) by mouth daily before breakfast.   Linzess 290 MCG Caps capsule Generic drug: linaclotide Take 290 mcg by mouth daily.   lisinopril 40 MG tablet Commonly known as: ZESTRIL Take 40 mg by mouth daily.   lovastatin 20 MG tablet Commonly known as: MEVACOR Take 20 mg by mouth at bedtime.   metFORMIN 1000 MG tablet Commonly known as: GLUCOPHAGE Take 1,000 mg by mouth 2 (two) times daily.   metoprolol tartrate 100 MG tablet  Commonly known as: LOPRESSOR Take 100 mg by mouth 2 (two) times daily.   Multi-Vitamin tablet Take 1 tablet by mouth daily.   nystatin cream Commonly known as: MYCOSTATIN   omeprazole 40 MG capsule Commonly known as:  PRILOSEC Take 1 capsule (40 mg total) by mouth in the morning and at bedtime.   ondansetron 4 MG disintegrating tablet Commonly known as: Zofran ODT Take 1 tablet (4 mg total) by mouth every 6 (six) hours as needed for nausea or vomiting.   oxyCODONE 5 MG immediate release tablet Commonly known as: Oxy IR/ROXICODONE Take 1 tablet (5 mg total) by mouth every 6 (six) hours as needed for severe pain or breakthrough pain.   potassium chloride 10 MEQ tablet Commonly known as: KLOR-CON Take 10 mEq by mouth 2 (two) times daily.   tamsulosin 0.4 MG Caps capsule Commonly known as: FLOMAX Take 0.4 mg by mouth daily after supper.   Tylenol 8 Hour Arthritis Pain 650 MG CR tablet Generic drug: acetaminophen Take 650 mg by mouth every 8 (eight) hours as needed for pain.         Follow-up Information    Pabon, [place] F, MD. Schedule an appointment as soon as possible for a visit in 2 week(s).   Specialty: General Surgery Why: s/p Nissen Fundoplication  Contact information: [address] [place] [address] [phone]                Time spent on discharge management including discussion of hospital course, clinical condition, outpatient instructions, prescriptions, and follow up with the patient and members of the medical team: >30 minutes  -- Edison Simon , PA-C [facility]  01/19/2020, 10:36 AM [phone] M-F: 7am - 4pm

## 2020-01-19 NOTE — Discharge Instructions (Signed)
In addition to included general post-operative instructions for Nissen Fundoplication,  Diet: Follow Nissen diet eating plan. AVOID Carbonation    Activity: No heavy lifting >20 pounds (children, pets, laundry, garbage) for 6 weeks, but light activity and walking are encouraged. Do not drive or drink alcohol if taking narcotic pain medications or having pain that might distract from driving.  Wound care: 2 days after surgery (10/28), you [place] shower/get incision wet with soapy water and pat dry (do not rub incisions), but no baths or submerging incision underwater until follow-up.   Medications: Resume all home medications. For mild to moderate pain: acetaminophen (Tylenol) or ibuprofen/naproxen (if no kidney disease). Combining Tylenol with alcohol can substantially increase your risk of causing liver disease. Narcotic pain medications, if prescribed, can be used for severe pain, though Flanery cause nausea, constipation, and drowsiness. Do not combine Tylenol and Percocet (or similar) within a 6 hour period as Percocet (and similar) contain(s) Tylenol. If you do not need the narcotic pain medication, you do not need to fill the prescription.  Call office [phone] / [phone]) at any time if any questions, worsening pain, fevers/chills, bleeding, drainage from incision site, or other concerns.

## 2020-01-24 ENCOUNTER — Telehealth: Payer: Self-pay | Admitting: Surgery

## 2020-01-24 NOTE — Telephone Encounter (Signed)
Patients wife stated there was a small amount of pinkish colored drainage from the incision. Denies fever, chills or warm to the touch. Patient has good appetite and is moving about well. Patient instructed to call back if site shows any signs of redness, infection.

## 2020-01-24 NOTE — Telephone Encounter (Signed)
Patient's wife is calling and has some questions about her husbands drain that has clearish/pink drainage coming out and is asking if one of the nurses could give them a call. Please call patient and advise.

## 2020-01-25 ENCOUNTER — Telehealth: Payer: Self-pay | Admitting: Surgery

## 2020-01-25 NOTE — Telephone Encounter (Signed)
The patient's wife states that it seems like he Loney of popped on of his stitches. The drainage has increased and is now dark red. Patient advised to apply an ice pack to the are and hold pressure to slow any bleeding. He will come in tomorrow to be seen by Dr Dahlia Byes.

## 2020-01-25 NOTE — Telephone Encounter (Signed)
Patient's wife, Brandon Benjamin calls.  He had hiatal surgery on 10/26 with Dr. Dahlia Byes.  Patient is having trouble with his drainage, they state it is dripping and is now a dark red, where was a pink red.  They are concerned, please call back.  Thank you.

## 2020-01-26 ENCOUNTER — Encounter: Payer: Self-pay | Admitting: Surgery

## 2020-01-26 ENCOUNTER — Other Ambulatory Visit: Payer: Self-pay

## 2020-01-26 ENCOUNTER — Ambulatory Visit (INDEPENDENT_AMBULATORY_CARE_PROVIDER_SITE_OTHER): Payer: Medicare Other | Admitting: Surgery

## 2020-01-26 VITALS — BP 164/62 | HR 79 | Temp 97.6°F | Resp 12 | Ht 69.0 in | Wt 204.0 lb

## 2020-01-26 DIAGNOSIS — Z09 Encounter for follow-up examination after completed treatment for conditions other than malignant neoplasm: Secondary | ICD-10-CM

## 2020-01-26 NOTE — Patient Instructions (Signed)
Please keep a dry dressing over the area until all drainage stops.

## 2020-01-27 NOTE — Progress Notes (Signed)
Outpatient Surgical Follow Up  01/27/2020  Brandon Benjamin is an 74 y.o. male.   Chief Complaint  Patient presents with  . Routine Post Op    hiatal hernia repair    HPI: s/p paraesophageal hernia repair, doing well, some serous  drainage from port site. Taking PO well, ambulating, no fevers or chills. He feels very well. No reflux and his prior preop abd pain is gone  Past Medical History:  Diagnosis Date  . Abdominal hernia   . Anemia   . Anxiety   . Balanitis   . Balanitis   . BPH (benign prostatic hyperplasia)   . Diabetes ([place])   . Erectile dysfunction   . GERD (gastroesophageal reflux disease)   . History of kidney stones   . HLD (hyperlipidemia)   . HTN (hypertension)   . Iron deficiency anemia due to chronic blood loss 09/04/2017  . Over weight   . Priapism   . Scrotal cyst   . Urinary urgency     Past Surgical History:  Procedure Laterality Date  . CHOLECYSTECTOMY N/A 12/25/2016   Procedure: LAPAROSCOPIC CHOLECYSTECTOMY WITH INTRAOPERATIVE CHOLANGIOGRAM;  Surgeon: Jules Husbands, MD;  Location: [facility] ORS;  Service: General;  Laterality: N/A;  . COLONOSCOPY WITH PROPOFOL N/A 09/09/2017   Procedure: COLONOSCOPY WITH PROPOFOL;  Surgeon: [name], MD;  Location: [facility] ENDOSCOPY;  Service: Gastroenterology;  Laterality: N/A;  . CYSTOSCOPY WITH LITHOLAPAXY N/A 07/16/2017   Procedure: CYSTOSCOPY WITH LITHOLAPAXY;  Surgeon: Hollice Espy, MD;  Location: [facility] ORS;  Service: Urology;  Laterality: N/A;  . ERCP N/A 11/28/2016   Procedure: ENDOSCOPIC RETROGRADE CHOLANGIOPANCREATOGRAPHY (ERCP);  Surgeon: [name], MD;  Location: [facility] ENDOSCOPY;  Service: Endoscopy;  Laterality: N/A;  . ESOPHAGOGASTRODUODENOSCOPY (EGD) WITH PROPOFOL N/A 09/09/2017   Procedure: ESOPHAGOGASTRODUODENOSCOPY (EGD) WITH PROPOFOL;  Surgeon: [name], MD;  Location: [facility] ENDOSCOPY;  Service: Gastroenterology;  Laterality: N/A;  . ESOPHAGOGASTRODUODENOSCOPY (EGD) WITH PROPOFOL N/A 12/01/2017    Procedure: ESOPHAGOGASTRODUODENOSCOPY (EGD) WITH PROPOFOL;  Surgeon: [name], MD;  Location: [facility] ENDOSCOPY;  Service: Gastroenterology;  Laterality: N/A;  . ESOPHAGOGASTRODUODENOSCOPY (EGD) WITH PROPOFOL N/A 02/09/2018   Procedure: ESOPHAGOGASTRODUODENOSCOPY (EGD) WITH PROPOFOL;  Surgeon: [name], MD;  Location: [facility] ENDOSCOPY;  Service: Gastroenterology;  Laterality: N/A;  . ESOPHAGOGASTRODUODENOSCOPY (EGD) WITH PROPOFOL N/A 12/28/2019   Procedure: ESOPHAGOGASTRODUODENOSCOPY (EGD) WITH PROPOFOL;  Surgeon: [name], MD;  Location: [facility] ENDOSCOPY;  Service: Endoscopy;  Laterality: N/A;  . GIVENS CAPSULE STUDY N/A 12/01/2017   Procedure: GIVENS CAPSULE STUDY;  Surgeon: [name], MD;  Location: [facility] ENDOSCOPY;  Service: Gastroenterology;  Laterality: N/A;  . HOLEP-LASER ENUCLEATION OF THE PROSTATE WITH MORCELLATION N/A 07/11/2015   Procedure: HOLEP-LASER ENUCLEATION OF THE PROSTATE WITH MORCELLATION;  Surgeon: Hollice Espy, MD;  Location: [facility] ORS;  Service: Urology;  Laterality: N/A;  . TRANSURETHRAL RESECTION OF PROSTATE N/A 07/16/2017   Procedure: TRANSURETHRAL RESECTION OF THE PROSTATE (TURP);  Surgeon: Hollice Espy, MD;  Location: [facility] ORS;  Service: Urology;  Laterality: N/A;  . UMBILICAL HERNIA REPAIR  12/25/2016   Procedure: HERNIA REPAIR UMBILICAL ADULT;  Surgeon: Jules Husbands, MD;  Location: [facility] ORS;  Service: General;;    Family History  Problem Relation Age of Onset  . Cancer Mother        blood stream  . High blood pressure Mother   . Hypertension Sister   . Anemia Sister   . Prostate cancer Neg Hx   . Bladder Cancer Neg Hx   . Kidney disease Neg Hx  Social History:  reports that he quit smoking about 24 years ago. His smoking use included cigarettes. He has never used smokeless tobacco. He reports that he does not drink alcohol and does not use drugs.  Allergies: No Known Allergies  Medications reviewed.    ROS Full ROS performed and is otherwise  negative other than what is stated in HPI   BP (!) 164/62   Pulse 79   Temp 97.6 F (36.4 C) (Oral)   Resp 12   Ht 5\' 9"  (1.753 m)   Wt 204 lb (92.5 kg)   SpO2 97%   BMI 30.13 kg/m   Physical Exam NAD alert Abd: soft, nt, incisions healing well, some serous fluid draining from periumbilical incision. No infection Ext: no edema and well perfused      Assessment/Plan: Doing very well , some serous drainage from port site , likely seroma. Daily dry dressing changes advised.  F/U 1-2 weeks for wound check At that time we will repeat CMP and PTH to make sure he does not have any hyperpara   Caroleen Hamman, MD [facility] General Surgeon

## 2020-02-02 ENCOUNTER — Telehealth: Payer: Self-pay

## 2020-02-02 ENCOUNTER — Encounter: Payer: Medicare Other | Admitting: Surgery

## 2020-02-02 DIAGNOSIS — K449 Diaphragmatic hernia without obstruction or gangrene: Secondary | ICD-10-CM

## 2020-02-02 NOTE — Telephone Encounter (Signed)
Spoke with patients wife and let her know that [place] needs lab work done prior to being seen on 02/09/20.

## 2020-02-03 ENCOUNTER — Other Ambulatory Visit
Admission: RE | Admit: 2020-02-03 | Discharge: 2020-02-03 | Disposition: A | Discharge status: Home / Self Care | Payer: Medicare Other | Attending: Surgery | Admitting: Surgery

## 2020-02-03 DIAGNOSIS — K449 Diaphragmatic hernia without obstruction or gangrene: Secondary | ICD-10-CM | POA: Insufficient documentation

## 2020-02-03 LAB — COMPREHENSIVE METABOLIC PANEL
ALT: 31 U/L (ref 0–44)
AST: 18 U/L (ref 15–41)
Albumin: 4.2 g/dL (ref 3.5–5.0)
Alkaline Phosphatase: 73 U/L (ref 38–126)
Anion gap: 10 (ref 5–15)
BUN: 17 mg/dL (ref 8–23)
CO2: 24 mmol/L (ref 22–32)
Calcium: 10.3 mg/dL (ref 8.9–10.3)
Chloride: 103 mmol/L (ref 98–111)
Creatinine, Ser: 1.37 mg/dL — ABNORMAL HIGH (ref 0.61–1.24)
GFR, Estimated: 55 mL/min — ABNORMAL LOW (ref >60)
Glucose, Bld: 127 mg/dL — ABNORMAL HIGH (ref 70–99)
Potassium: 3.3 mmol/L — ABNORMAL LOW (ref 3.5–5.1)
Sodium: 137 mmol/L (ref 135–145)
Total Bilirubin: 0.9 mg/dL (ref 0.3–1.2)
Total Protein: 6.9 g/dL (ref 6.5–8.1)

## 2020-02-04 LAB — PTH, INTACT AND CALCIUM
Calcium, Total (PTH): 10.6 mg/dL — ABNORMAL HIGH (ref 8.6–10.2)
PTH: 38 pg/mL (ref 15–65)

## 2020-02-09 ENCOUNTER — Encounter: Payer: Self-pay | Admitting: Surgery

## 2020-02-09 ENCOUNTER — Ambulatory Visit (INDEPENDENT_AMBULATORY_CARE_PROVIDER_SITE_OTHER): Payer: Medicare Other | Admitting: Surgery

## 2020-02-09 ENCOUNTER — Other Ambulatory Visit: Payer: Self-pay

## 2020-02-09 VITALS — BP 147/91 | HR 60 | Temp 97.9°F | Ht 69.0 in | Wt 190.0 lb

## 2020-02-09 DIAGNOSIS — K449 Diaphragmatic hernia without obstruction or gangrene: Secondary | ICD-10-CM

## 2020-02-09 NOTE — Progress Notes (Signed)
Outpatient Surgical Follow Up  02/09/2020  Brandon Benjamin is an 74 y.o. male.   Chief Complaint  Patient presents with  . Hiatal Hernia    follow up-wound check    HPI: Crystal is following after repair of hiatal hernia.  He is doing well.  No dysphagia.  Tolerating diet.  No fevers no chills.  No nausea no vomiting.  His abdominal pain that he had preoperatively is completely gone.  He did have hypercalcemia and I have been working up including PTH and calcium levels.  Calcium levels are going down and PTH was normal.    Past Medical History:  Diagnosis Date  . Abdominal hernia   . Anemia   . Anxiety   . Balanitis   . Balanitis   . BPH (benign prostatic hyperplasia)   . Diabetes ([place])   . Erectile dysfunction   . GERD (gastroesophageal reflux disease)   . History of kidney stones   . HLD (hyperlipidemia)   . HTN (hypertension)   . Iron deficiency anemia due to chronic blood loss 09/04/2017  . Over weight   . Priapism   . Scrotal cyst   . Urinary urgency     Past Surgical History:  Procedure Laterality Date  . CHOLECYSTECTOMY N/A 12/25/2016   Procedure: LAPAROSCOPIC CHOLECYSTECTOMY WITH INTRAOPERATIVE CHOLANGIOGRAM;  Surgeon: Jules Husbands, MD;  Location: [facility] ORS;  Service: General;  Laterality: N/A;  . COLONOSCOPY WITH PROPOFOL N/A 09/09/2017   Procedure: COLONOSCOPY WITH PROPOFOL;  Surgeon: [name], MD;  Location: [facility] ENDOSCOPY;  Service: Gastroenterology;  Laterality: N/A;  . CYSTOSCOPY WITH LITHOLAPAXY N/A 07/16/2017   Procedure: CYSTOSCOPY WITH LITHOLAPAXY;  Surgeon: Hollice Espy, MD;  Location: [facility] ORS;  Service: Urology;  Laterality: N/A;  . ERCP N/A 11/28/2016   Procedure: ENDOSCOPIC RETROGRADE CHOLANGIOPANCREATOGRAPHY (ERCP);  Surgeon: [name], MD;  Location: [facility] ENDOSCOPY;  Service: Endoscopy;  Laterality: N/A;  . ESOPHAGOGASTRODUODENOSCOPY (EGD) WITH PROPOFOL N/A 09/09/2017   Procedure: ESOPHAGOGASTRODUODENOSCOPY (EGD) WITH PROPOFOL;  Surgeon: [name], MD;  Location: [facility] ENDOSCOPY;  Service: Gastroenterology;  Laterality: N/A;  . ESOPHAGOGASTRODUODENOSCOPY (EGD) WITH PROPOFOL N/A 12/01/2017   Procedure: ESOPHAGOGASTRODUODENOSCOPY (EGD) WITH PROPOFOL;  Surgeon: [name], MD;  Location: [facility] ENDOSCOPY;  Service: Gastroenterology;  Laterality: N/A;  . ESOPHAGOGASTRODUODENOSCOPY (EGD) WITH PROPOFOL N/A 02/09/2018   Procedure: ESOPHAGOGASTRODUODENOSCOPY (EGD) WITH PROPOFOL;  Surgeon: [name], MD;  Location: [facility] ENDOSCOPY;  Service: Gastroenterology;  Laterality: N/A;  . ESOPHAGOGASTRODUODENOSCOPY (EGD) WITH PROPOFOL N/A 12/28/2019   Procedure: ESOPHAGOGASTRODUODENOSCOPY (EGD) WITH PROPOFOL;  Surgeon: [name], MD;  Location: [facility] ENDOSCOPY;  Service: Endoscopy;  Laterality: N/A;  . GIVENS CAPSULE STUDY N/A 12/01/2017   Procedure: GIVENS CAPSULE STUDY;  Surgeon: [name], MD;  Location: [facility] ENDOSCOPY;  Service: Gastroenterology;  Laterality: N/A;  . HOLEP-LASER ENUCLEATION OF THE PROSTATE WITH MORCELLATION N/A 07/11/2015   Procedure: HOLEP-LASER ENUCLEATION OF THE PROSTATE WITH MORCELLATION;  Surgeon: Hollice Espy, MD;  Location: [facility] ORS;  Service: Urology;  Laterality: N/A;  . TRANSURETHRAL RESECTION OF PROSTATE N/A 07/16/2017   Procedure: TRANSURETHRAL RESECTION OF THE PROSTATE (TURP);  Surgeon: Hollice Espy, MD;  Location: [facility] ORS;  Service: Urology;  Laterality: N/A;  . UMBILICAL HERNIA REPAIR  12/25/2016   Procedure: HERNIA REPAIR UMBILICAL ADULT;  Surgeon: Jules Husbands, MD;  Location: [facility] ORS;  Service: General;;    Family History  Problem Relation Age of Onset  . Cancer Mother        blood stream  . High blood pressure Mother   .  Hypertension Sister   . Anemia Sister   . Prostate cancer Neg Hx   . Bladder Cancer Neg Hx   . Kidney disease Neg Hx     Social History:  reports that he quit smoking about 24 years ago. His smoking use included cigarettes. He has never used smokeless tobacco. He reports that he does  not drink alcohol and does not use drugs.  Allergies: No Known Allergies  Medications reviewed.    ROS Full ROS performed and is otherwise negative other than what is stated in HPI   BP (!) 147/91   Pulse 60   Temp 97.9 F (36.6 C) (Oral)   Ht 5\' 9"  (1.753 m)   Wt 190 lb (86.2 kg)   SpO2 96%   BMI 28.06 kg/m   Physical Exam   NAD, alert Neck: no masses, no LAD, trach midline, no JVD Abd: soft, nt, iincisions healed, no infection , no rebound no tenderness  Assessment/Plan: S/p robotic hiatal hernia repair doing very well from this aspect.  His abdominal pain has completely resolved.  Wounds have completely healed. Comes in today for hypercalcemia.  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  Discussed with patient detail.  Is going to counseling.  No evidence of hyperparathyroidism at this time.  Note that he is hypercalcemia was unrelated to his procedure I will see him back in about 3 months with a repeat calcium to make sure that he is going the right direction.  Greater than 50% of the 25 minutes  visit was spent in counseling/coordination of care   Caroleen Hamman, MD [facility] Surgeon

## 2020-02-09 NOTE — Patient Instructions (Addendum)
You can begin your regular diet. Stay away from carbohydrates for now. Stop taking potassium.   Please have blood work (Lupus) prior to your appointment in Feb 2022.

## 2020-03-01 ENCOUNTER — Encounter (INDEPENDENT_AMBULATORY_CARE_PROVIDER_SITE_OTHER): Payer: Medicare Other | Admitting: Gastroenterology

## 2020-03-02 ENCOUNTER — Other Ambulatory Visit: Payer: Self-pay

## 2020-03-02 ENCOUNTER — Encounter: Payer: Self-pay | Admitting: Podiatry

## 2020-03-02 ENCOUNTER — Ambulatory Visit (INDEPENDENT_AMBULATORY_CARE_PROVIDER_SITE_OTHER): Payer: Medicare Other | Admitting: Podiatry

## 2020-03-02 DIAGNOSIS — B351 Tinea unguium: Secondary | ICD-10-CM | POA: Diagnosis not present

## 2020-03-02 DIAGNOSIS — N189 Chronic kidney disease, unspecified: Secondary | ICD-10-CM

## 2020-03-02 DIAGNOSIS — E119 Type 2 diabetes mellitus without complications: Secondary | ICD-10-CM | POA: Diagnosis not present

## 2020-03-02 DIAGNOSIS — M79676 Pain in unspecified toe(s): Secondary | ICD-10-CM | POA: Diagnosis not present

## 2020-03-02 NOTE — Progress Notes (Signed)
This patient returns to my office for at risk foot care.  This patient requires this care by a professional since this patient will be at risk due to having diabetes and CKD.  This patient is unable to cut nails himself since the patient cannot reach his nails.These nails are painful walking and wearing shoes.  This patient presents for at risk foot care today.  General Appearance  Alert, conversant and in no acute stress.  Vascular  Dorsalis pedis and posterior tibial  pulses are palpable  bilaterally.  Capillary return is within normal limits  bilaterally. Temperature is within normal limits  bilaterally.  Neurologic  Senn-Weinstein monofilament wire test within normal limits  bilaterally. Muscle power within normal limits bilaterally.  Nails Thick disfigured discolored nails with subungual debris  from hallux to fifth toes bilaterally. No evidence of bacterial infection or drainage bilaterally.  Orthopedic  No limitations of motion  feet .  No crepitus or effusions noted.  No bony pathology or digital deformities noted.  Skin  normotropic skin with no porokeratosis noted bilaterally.  No signs of infections or ulcers noted.     Onychomycosis  Pain in right toes  Pain in left toes  Consent was obtained for treatment procedures.   Mechanical debridement of nails 1-5  bilaterally performed with a nail nipper.  Filed with dremel without incident.    Return office visit   3 months                   Told patient to return for periodic foot care and evaluation due to potential at risk complications.   Kourtlyn Charlet DPM   

## 2020-03-06 ENCOUNTER — Telehealth: Payer: Self-pay

## 2020-03-06 ENCOUNTER — Encounter: Payer: Self-pay | Admitting: Surgery

## 2020-03-06 ENCOUNTER — Ambulatory Visit (INDEPENDENT_AMBULATORY_CARE_PROVIDER_SITE_OTHER): Payer: Medicare Other | Admitting: Surgery

## 2020-03-06 ENCOUNTER — Other Ambulatory Visit: Payer: Self-pay

## 2020-03-06 VITALS — BP 123/83 | HR 62 | Temp 98.0°F | Ht 69.0 in | Wt 181.0 lb

## 2020-03-06 DIAGNOSIS — K449 Diaphragmatic hernia without obstruction or gangrene: Secondary | ICD-10-CM

## 2020-03-06 NOTE — Telephone Encounter (Signed)
LVM for pt of his barium swallow test appt which is scheduled for 03/10/20 @ 9:30am. Pt should arrive by 9:00am and NPO 3 hours prior.

## 2020-03-06 NOTE — Telephone Encounter (Signed)
Pt's wife called back to verify appt on 12/17 and wanted directions at [facility]. Verbalized understanding.

## 2020-03-06 NOTE — Patient Instructions (Addendum)
Please continue to eat soft food and follow up with [place] in 3 weeks. See appointment below.      GENERAL POST-OPERATIVE PATIENT INSTRUCTIONS   WOUND CARE INSTRUCTIONS:  Keep a dry clean dressing on the wound if there is drainage. The initial bandage Boettcher be removed after 24 hours.  Once the wound has quit draining you [place] leave it open to air.  If clothing rubs against the wound or causes irritation and the wound is not draining you Pavlov cover it with a dry dressing during the daytime.  Try to keep the wound dry and avoid ointments on the wound unless directed to do so.  If the wound becomes bright red and painful or starts to drain infected material that is not clear, please contact your physician immediately.  If the wound is mildly pink and has a thick firm ridge underneath it, this is normal, and is referred to as a healing ridge.  This will resolve over the next 4-6 weeks.  BATHING: You [place] shower if you have been informed of this by your surgeon. However, Please do not submerge in a tub, hot tub, or pool until incisions are completely sealed or have been told by your surgeon that you Boline do so.  DIET:  You [place] eat any foods that you can tolerate.  It is a good idea to eat a high fiber diet and take in plenty of fluids to prevent constipation.  If you do become constipated you Busey want to take a mild laxative or take ducolax tablets on a daily basis until your bowel habits are regular.  Constipation can be very uncomfortable, along with straining, after recent surgery.  ACTIVITY:  You are encouraged to cough and deep breath or use your incentive spirometer if you were given one, every 15-30 minutes when awake.  This will help prevent respiratory complications and low grade fevers post-operatively if you had a general anesthetic.  You [place] want to hug a pillow when coughing and sneezing to add additional support to the surgical area, if you had abdominal or chest surgery, which will decrease pain  during these times.  You are encouraged to walk and engage in light activity for the next two weeks.  At that time- Listen to your body when lifting, if you have pain when lifting, stop and then try again in a few days. Soreness after doing exercises or activities of daily living is normal as you get back in to your normal routine.  MEDICATIONS:  Try to take narcotic medications and anti-inflammatory medications, such as tylenol, ibuprofen, naprosyn, etc., with food.  This will minimize stomach upset from the medication.  Should you develop nausea and vomiting from the pain medication, or develop a rash, please discontinue the medication and contact your physician.  You should not drive, make important decisions, or operate machinery when taking narcotic pain medication.  SUNBLOCK Use sun block to incision area over the next year if this area will be exposed to sun. This helps decrease scarring and will allow you avoid a permanent darkened area over your incision.

## 2020-03-07 NOTE — Progress Notes (Signed)
Brandon Benjamin is a 74 year old well-known to me with a history of prior robotic esophageal hernia performed by me 6 weeks ago.  He did very well initially with resolution of pain and reflux.  Day he comes with one week history of dysphagia.  He states that is mainly for solids and it varies greatly with the type of food.  No fevers no chills.  Interesting he feels that his dysphagia is within the cervical esophagus and neck. Does not feel any retrosternal or mediastinal discomfort.  PE NAD Abdomen: Incisions healed no evidence of infection no evidence of peritonitis no tenderness.  A/P dysphagia following possibly a hernia repair.  He is still relatively fresh and this is not necessarily unusual what is unusual is the location where he feels a dysphagia.  We will start work-up with a swallow evaluation.  I will see him back in a couple weeks.  Is not toxic and does not need hospitalization or any immediate procedures at this time

## 2020-03-10 ENCOUNTER — Ambulatory Visit
Admission: RE | Admit: 2020-03-10 | Discharge: 2020-03-10 | Disposition: A | Discharge status: Home / Self Care | Payer: Medicare Other | Source: Ambulatory Visit | Attending: Surgery | Admitting: Surgery

## 2020-03-10 ENCOUNTER — Other Ambulatory Visit: Payer: Self-pay

## 2020-03-10 DIAGNOSIS — K449 Diaphragmatic hernia without obstruction or gangrene: Secondary | ICD-10-CM | POA: Insufficient documentation

## 2020-03-13 ENCOUNTER — Telehealth: Payer: Self-pay

## 2020-03-13 ENCOUNTER — Telehealth: Payer: Self-pay | Admitting: *Deleted

## 2020-03-13 NOTE — Telephone Encounter (Signed)
Left message for patient to return call-Swallow study looks good- per Dr.Pabon keep scheduled appointment 03/29/20 @ 9:30.

## 2020-03-13 NOTE — Telephone Encounter (Signed)
Patient called back and is aware of results and to follow up with Dr Dahlia Byes on 03/29/20

## 2020-03-13 NOTE — Telephone Encounter (Signed)
error 

## 2020-03-29 ENCOUNTER — Encounter: Payer: Self-pay | Admitting: Surgery

## 2020-03-29 ENCOUNTER — Ambulatory Visit (INDEPENDENT_AMBULATORY_CARE_PROVIDER_SITE_OTHER): Payer: Medicare Other | Admitting: Surgery

## 2020-03-29 ENCOUNTER — Other Ambulatory Visit: Payer: Self-pay

## 2020-03-29 VITALS — BP 121/81 | HR 64 | Temp 98.3°F | Ht 69.0 in | Wt 183.0 lb

## 2020-03-29 DIAGNOSIS — Z09 Encounter for follow-up examination after completed treatment for conditions other than malignant neoplasm: Secondary | ICD-10-CM

## 2020-03-29 NOTE — Patient Instructions (Signed)
Call us in a couple of months to give [place] an update on how you are doing.  Follow-up with our office as needed.  Please call and ask to speak with a nurse if you develop questions or concerns.

## 2020-03-30 NOTE — Progress Notes (Signed)
75 year old male well-known to me status post esophageal hernia repair close to 2 months ago.  He had some transient dysphagia that now has resolved.  I performed a barium swallow showing resolution of hiatal hernia.  There is no evidence complications related to the wrap.  He is otherwise doing well.  Walking and tolerating diet.  Physical exam: No acute distress awake alert Abdomen: Soft incisions healed no evidence of infection no evidence of hernia  A/P doing well.  Transient dysphagia now resolved.  No need for further work-up.  Return to clinic in a couple months

## 2020-04-18 ENCOUNTER — Ambulatory Visit: Payer: Medicare Other | Admitting: Urology

## 2020-06-01 ENCOUNTER — Encounter: Payer: Self-pay | Admitting: Podiatry

## 2020-06-01 ENCOUNTER — Ambulatory Visit (INDEPENDENT_AMBULATORY_CARE_PROVIDER_SITE_OTHER): Payer: Medicare Other | Admitting: Podiatry

## 2020-06-01 ENCOUNTER — Other Ambulatory Visit: Payer: Self-pay

## 2020-06-01 DIAGNOSIS — M79676 Pain in unspecified toe(s): Secondary | ICD-10-CM | POA: Diagnosis not present

## 2020-06-01 DIAGNOSIS — N189 Chronic kidney disease, unspecified: Secondary | ICD-10-CM | POA: Diagnosis not present

## 2020-06-01 DIAGNOSIS — E119 Type 2 diabetes mellitus without complications: Secondary | ICD-10-CM | POA: Diagnosis not present

## 2020-06-01 DIAGNOSIS — B351 Tinea unguium: Secondary | ICD-10-CM

## 2020-06-01 NOTE — Progress Notes (Signed)
This patient returns to my office for at risk foot care.  This patient requires this care by a professional since this patient will be at risk due to having diabetes and CKD..  This patient is unable to cut nails himself since the patient cannot reach his nails.These nails are painful walking and wearing shoes.  This patient presents for at risk foot care today.  General Appearance  Alert, conversant and in no acute stress.  Vascular  Dorsalis pedis and posterior tibial  pulses are palpable  bilaterally.  Capillary return is within normal limits  bilaterally. Temperature is within normal limits  bilaterally.  Neurologic  Senn-Weinstein monofilament wire test within normal limits  bilaterally. Muscle power within normal limits bilaterally.  Nails Thick disfigured discolored nails with subungual debris  from hallux to fifth toes bilaterally. No evidence of bacterial infection or drainage bilaterally.  Orthopedic  No limitations of motion  feet .  No crepitus or effusions noted.  Hammer toes 2-5  B/L.  Skin  normotropic skin with no porokeratosis noted bilaterally.  No signs of infections or ulcers noted.     Onychomycosis  Pain in right toes  Pain in left toes  Consent was obtained for treatment procedures.   Mechanical debridement of nails 1-5  bilaterally performed with a nail nipper.  Filed with dremel without incident.    Return office visit     3 months                 Told patient to return for periodic foot care and evaluation due to potential at risk complications.   Gardiner Barefoot DPM

## 2020-07-12 ENCOUNTER — Telehealth: Payer: Self-pay | Admitting: Surgery

## 2020-07-12 NOTE — Telephone Encounter (Signed)
Spoke with the patient and let him know we will refill this once. In the future he will need to have his PCP refill this medication. The patient is amendable to this.

## 2020-07-12 NOTE — Telephone Encounter (Signed)
Pt's spouse, Pamala Hurry, called concerned the the rx for Omeprazole written by Dr. Dahlia Byes will run out on Fri (07/14/20), as it does not currently have refills.  Plz send refill approval to [place] [employer]).  Otherwise, she can be reached @ [phone] for any further questions or concerns.  Thank you

## 2020-07-21 ENCOUNTER — Telehealth (INDEPENDENT_AMBULATORY_CARE_PROVIDER_SITE_OTHER): Payer: Self-pay | Admitting: Gastroenterology

## 2020-07-21 ENCOUNTER — Other Ambulatory Visit: Payer: Self-pay

## 2020-07-21 DIAGNOSIS — Z86010 Personal history of colonic polyps: Secondary | ICD-10-CM

## 2020-07-21 MED ORDER — NA SULFATE-K SULFATE-MG SULF 17.5-3.13-1.6 GM/177ML PO SOLN: 1.0000 | Freq: Once | ORAL | 0 refills | Status: AC

## 2020-07-21 NOTE — Progress Notes (Signed)
Gastroenterology Pre-Procedure Review  Request Date: Monday 06/27/ Requesting Physician: Dr. Vicente Males  PATIENT REVIEW QUESTIONS: The patient responded to the following health history questions as indicated:    1. Are you having any GI issues? no 2. Do you have a personal history of Polyps?09/09/17 /3. Do you have a family history of Colon Cancer or Polyps? no 4. Diabetes Mellitus? yes (type 2) 5. Joint replacements in the past 12 months?no 6. Major health problems in the past 3 months?no 7. Any artificial heart valves, MVP, or defibrillator?no    MEDICATIONS & ALLERGIES:    Patient reports the following regarding taking any anticoagulation/antiplatelet therapy:   Plavix, Coumadin, Eliquis, Xarelto, Lovenox, Pradaxa, Brilinta, or Effient? no Aspirin? no  Patient confirms/reports the following medications:  Current Outpatient Medications  Medication Sig Dispense Refill  . ACCU-CHEK AVIVA PLUS test strip     . ACCU-CHEK SOFTCLIX LANCETS lancets     . acetaminophen (TYLENOL) 650 MG CR tablet Take 650 mg by mouth every 8 (eight) hours as needed for pain.     Marland Kitchen amLODipine (NORVASC) 10 MG tablet Take 10 mg by mouth daily.    . cetirizine (ZYRTEC) 10 MG tablet Take 10 mg by mouth daily.    . citalopram (CELEXA) 10 MG tablet Take 10 mg by mouth daily.    Marland Kitchen dicyclomine (BENTYL) 10 MG capsule     . docusate sodium (COLACE) 100 MG capsule Take 100 mg by mouth 2 (two) times daily.     Marland Kitchen FIBER ADULT GUMMIES PO Take 2 tablets by mouth daily.    . fluticasone (FLONASE) 50 MCG/ACT nasal spray Place 2 sprays into both nostrils daily.     . hydrALAZINE (APRESOLINE) 25 MG tablet Take 25 mg by mouth 3 (three) times daily.     . hydrochlorothiazide (HYDRODIURIL) 25 MG tablet Take 25 mg by mouth daily.     Marland Kitchen ibuprofen (ADVIL) 200 MG tablet Take 600 mg by mouth every 6 (six) hours as needed for moderate pain.    Marland Kitchen linaclotide (LINZESS) 290 MCG CAPS capsule Take 1 capsule (290 mcg total) by mouth daily before  breakfast. 90 capsule 1  . lisinopril (PRINIVIL,ZESTRIL) 40 MG tablet Take 40 mg by mouth daily.    Marland Kitchen lovastatin (MEVACOR) 20 MG tablet Take 20 mg by mouth at bedtime.    . lovastatin (MEVACOR) 40 MG tablet Take 40 mg by mouth daily.    . metFORMIN (GLUCOPHAGE) 1000 MG tablet Take 1,000 mg by mouth 2 (two) times daily.    . metoprolol (LOPRESSOR) 100 MG tablet Take 100 mg by mouth 2 (two) times daily.     . Multiple Vitamin (MULTI-VITAMIN) tablet Take 1 tablet by mouth daily.     Marland Kitchen nystatin cream (MYCOSTATIN) Apply topically.    Marland Kitchen omeprazole (PRILOSEC) 40 MG capsule Take 1 capsule (40 mg total) by mouth in the morning and at bedtime. 60 capsule 2  . tamsulosin (FLOMAX) 0.4 MG CAPS capsule Take 0.4 mg by mouth daily after supper.      No current facility-administered medications for this visit.    Patient confirms/reports the following allergies:  No Known Allergies  No orders of the defined types were placed in this encounter.   AUTHORIZATION INFORMATION Primary Insurance: 1D#: Group #:  Secondary Insurance: 1D#: Group #:  SCHEDULE INFORMATION: Date: 09/18/20 Time: Location:[facility]

## 2020-09-04 ENCOUNTER — Ambulatory Visit: Payer: Medicare Other | Admitting: Podiatry

## 2020-09-11 ENCOUNTER — Encounter: Payer: Self-pay | Admitting: Podiatry

## 2020-09-11 ENCOUNTER — Other Ambulatory Visit: Payer: Self-pay

## 2020-09-11 ENCOUNTER — Ambulatory Visit (INDEPENDENT_AMBULATORY_CARE_PROVIDER_SITE_OTHER): Payer: Medicare Other | Admitting: Podiatry

## 2020-09-11 DIAGNOSIS — M79676 Pain in unspecified toe(s): Secondary | ICD-10-CM | POA: Diagnosis not present

## 2020-09-11 DIAGNOSIS — E119 Type 2 diabetes mellitus without complications: Secondary | ICD-10-CM

## 2020-09-11 DIAGNOSIS — B351 Tinea unguium: Secondary | ICD-10-CM

## 2020-09-11 DIAGNOSIS — N189 Chronic kidney disease, unspecified: Secondary | ICD-10-CM | POA: Diagnosis not present

## 2020-09-11 NOTE — Progress Notes (Signed)
This patient returns to my office for at risk foot care.  This patient requires this care by a professional since this patient will be at risk due to having diabetes and CKD..  This patient is unable to cut nails himself since the patient cannot reach his nails.These nails are painful walking and wearing shoes.  This patient presents for at risk foot care today.  General Appearance  Alert, conversant and in no acute stress.  Vascular  Dorsalis pedis and posterior tibial  pulses are palpable  bilaterally.  Capillary return is within normal limits  bilaterally. Temperature is within normal limits  bilaterally.  Neurologic  Senn-Weinstein monofilament wire test within normal limits  bilaterally. Muscle power within normal limits bilaterally.  Nails Thick disfigured discolored nails with subungual debris  from hallux to fifth toes bilaterally. No evidence of bacterial infection or drainage bilaterally.  Orthopedic  No limitations of motion  feet .  No crepitus or effusions noted.  Hammer toes 2-5  B/L.  Skin  normotropic skin with no porokeratosis noted bilaterally.  No signs of infections or ulcers noted.     Onychomycosis  Pain in right toes  Pain in left toes  Consent was obtained for treatment procedures.   Mechanical debridement of nails 1-5  bilaterally performed with a nail nipper.  Filed with dremel without incident.    Return office visit     3 months                 Told patient to return for periodic foot care and evaluation due to potential at risk complications.   Gardiner Barefoot DPM

## 2020-09-12 ENCOUNTER — Telehealth: Payer: Self-pay | Admitting: Gastroenterology

## 2020-09-12 NOTE — Telephone Encounter (Signed)
Called patient back and left him a detailed message letting him know that he will have to call the endoscopy unit and speak with one of their nurses in reference to what medication to take on the day of his procedure. Endoscopy unit: [phone].

## 2020-09-12 NOTE — Telephone Encounter (Signed)
Patient's wife called and Dr. Rebeca Alert (PCP) put him on meds for back pain. He is now taking Naproxim 500 mg twice a day and Cyclobenzaprine 10 mg once a day. They are wanting to know how to take this before his colonoscopy or when to stop this medication.

## 2020-09-15 ENCOUNTER — Encounter: Payer: Self-pay | Admitting: Gastroenterology

## 2020-09-18 ENCOUNTER — Ambulatory Visit: Payer: Medicare Other | Admitting: Anesthesiology

## 2020-09-18 ENCOUNTER — Encounter
Admission: RE | Disposition: A | Discharge status: Home / Self Care | Payer: Self-pay | Source: Home / Self Care | Attending: Gastroenterology

## 2020-09-18 ENCOUNTER — Ambulatory Visit
Admission: RE | Admit: 2020-09-18 | Discharge: 2020-09-18 | Disposition: A | Discharge status: Home / Self Care | Payer: Medicare Other | Attending: Gastroenterology | Admitting: Gastroenterology

## 2020-09-18 DIAGNOSIS — K640 First degree hemorrhoids: Secondary | ICD-10-CM | POA: Insufficient documentation

## 2020-09-18 DIAGNOSIS — K219 Gastro-esophageal reflux disease without esophagitis: Secondary | ICD-10-CM | POA: Diagnosis not present

## 2020-09-18 DIAGNOSIS — Z791 Long term (current) use of non-steroidal anti-inflammatories (NSAID): Secondary | ICD-10-CM | POA: Insufficient documentation

## 2020-09-18 DIAGNOSIS — Z86010 Personal history of colonic polyps: Secondary | ICD-10-CM

## 2020-09-18 DIAGNOSIS — Z7984 Long term (current) use of oral hypoglycemic drugs: Secondary | ICD-10-CM | POA: Diagnosis not present

## 2020-09-18 DIAGNOSIS — Z87891 Personal history of nicotine dependence: Secondary | ICD-10-CM | POA: Diagnosis not present

## 2020-09-18 DIAGNOSIS — Z8249 Family history of ischemic heart disease and other diseases of the circulatory system: Secondary | ICD-10-CM | POA: Insufficient documentation

## 2020-09-18 DIAGNOSIS — K635 Polyp of colon: Secondary | ICD-10-CM | POA: Diagnosis not present

## 2020-09-18 DIAGNOSIS — Z1211 Encounter for screening for malignant neoplasm of colon: Secondary | ICD-10-CM | POA: Insufficient documentation

## 2020-09-18 DIAGNOSIS — D122 Benign neoplasm of ascending colon: Secondary | ICD-10-CM | POA: Insufficient documentation

## 2020-09-18 DIAGNOSIS — Z79899 Other long term (current) drug therapy: Secondary | ICD-10-CM | POA: Insufficient documentation

## 2020-09-18 DIAGNOSIS — Z808 Family history of malignant neoplasm of other organs or systems: Secondary | ICD-10-CM | POA: Diagnosis not present

## 2020-09-18 HISTORY — PX: COLONOSCOPY WITH PROPOFOL: SHX5780

## 2020-09-18 LAB — GLUCOSE, CAPILLARY: Glucose-Capillary: 156 mg/dL — ABNORMAL HIGH (ref 70–99)

## 2020-09-18 SURGERY — COLONOSCOPY WITH PROPOFOL
Anesthesia: General

## 2020-09-18 MED ORDER — PROPOFOL 10 MG/ML IV BOLUS
INTRAVENOUS | Status: DC | PRN
  Administered 2020-09-18: 80 mg via INTRAVENOUS

## 2020-09-18 MED ORDER — PROPOFOL 500 MG/50ML IV EMUL
INTRAVENOUS | Status: AC
  Filled 2020-09-18: qty 50

## 2020-09-18 MED ORDER — LIDOCAINE HCL (CARDIAC) PF 100 MG/5ML IV SOSY
PREFILLED_SYRINGE | INTRAVENOUS | Status: DC | PRN
  Administered 2020-09-18: 40 mg via INTRAVENOUS

## 2020-09-18 MED ORDER — SODIUM CHLORIDE 0.9 % IV SOLN
INTRAVENOUS | Status: DC
  Administered 2020-09-18: 20 mL/h via INTRAVENOUS

## 2020-09-18 MED ORDER — PROPOFOL 500 MG/50ML IV EMUL
INTRAVENOUS | Status: DC | PRN
  Administered 2020-09-18: 150 ug/kg/min via INTRAVENOUS

## 2020-09-18 MED ORDER — LIDOCAINE HCL (PF) 2 % IJ SOLN
INTRAMUSCULAR | Status: AC
  Filled 2020-09-18: qty 5

## 2020-09-18 NOTE — H&P (Signed)
[name], MD [address] [place], [place], [place], [address] [address], [place], [place], [place], [address] Phone: [phone]  Fax: [phone]  Primary Care Physician:  Letta Median, MD   Pre-Procedure History & Physical: HPI:  Brandon Benjamin is a 75 y.o. male is here for an colonoscopy.   Past Medical History:  Diagnosis Date   Abdominal hernia    Anemia    Anxiety    Balanitis    Balanitis    BPH (benign prostatic hyperplasia)    Diabetes (HCC)    Erectile dysfunction    GERD (gastroesophageal reflux disease)    History of kidney stones    HLD (hyperlipidemia)    HTN (hypertension)    Iron deficiency anemia due to chronic blood loss 09/04/2017   Over weight    Priapism    Scrotal cyst    Urinary urgency     Past Surgical History:  Procedure Laterality Date   CHOLECYSTECTOMY N/A 12/25/2016   Procedure: LAPAROSCOPIC CHOLECYSTECTOMY WITH INTRAOPERATIVE CHOLANGIOGRAM;  Surgeon: Jules Husbands, MD;  Location: [facility] ORS;  Service: General;  Laterality: N/A;   COLONOSCOPY WITH PROPOFOL N/A 09/09/2017   Procedure: COLONOSCOPY WITH PROPOFOL;  Surgeon: [name], MD;  Location: [facility] ENDOSCOPY;  Service: Gastroenterology;  Laterality: N/A;   CYSTOSCOPY WITH LITHOLAPAXY N/A 07/16/2017   Procedure: CYSTOSCOPY WITH LITHOLAPAXY;  Surgeon: Hollice Espy, MD;  Location: [facility] ORS;  Service: Urology;  Laterality: N/A;   ERCP N/A 11/28/2016   Procedure: ENDOSCOPIC RETROGRADE CHOLANGIOPANCREATOGRAPHY (ERCP);  Surgeon: [name], MD;  Location: [facility] ENDOSCOPY;  Service: Endoscopy;  Laterality: N/A;   ESOPHAGOGASTRODUODENOSCOPY (EGD) WITH PROPOFOL N/A 09/09/2017   Procedure: ESOPHAGOGASTRODUODENOSCOPY (EGD) WITH PROPOFOL;  Surgeon: [name], MD;  Location: [facility] ENDOSCOPY;  Service: Gastroenterology;  Laterality: N/A;   ESOPHAGOGASTRODUODENOSCOPY (EGD) WITH PROPOFOL N/A 12/01/2017   Procedure: ESOPHAGOGASTRODUODENOSCOPY (EGD) WITH PROPOFOL;  Surgeon: [name], MD;   Location: [facility] ENDOSCOPY;  Service: Gastroenterology;  Laterality: N/A;   ESOPHAGOGASTRODUODENOSCOPY (EGD) WITH PROPOFOL N/A 02/09/2018   Procedure: ESOPHAGOGASTRODUODENOSCOPY (EGD) WITH PROPOFOL;  Surgeon: [name], MD;  Location: [facility] ENDOSCOPY;  Service: Gastroenterology;  Laterality: N/A;   ESOPHAGOGASTRODUODENOSCOPY (EGD) WITH PROPOFOL N/A 12/28/2019   Procedure: ESOPHAGOGASTRODUODENOSCOPY (EGD) WITH PROPOFOL;  Surgeon: [name], MD;  Location: [facility] ENDOSCOPY;  Service: Endoscopy;  Laterality: N/A;   GIVENS CAPSULE STUDY N/A 12/01/2017   Procedure: GIVENS CAPSULE STUDY;  Surgeon: [name], MD;  Location: [facility] ENDOSCOPY;  Service: Gastroenterology;  Laterality: N/A;   HOLEP-LASER ENUCLEATION OF THE PROSTATE WITH MORCELLATION N/A 07/11/2015   Procedure: HOLEP-LASER ENUCLEATION OF THE PROSTATE WITH MORCELLATION;  Surgeon: Hollice Espy, MD;  Location: [facility] ORS;  Service: Urology;  Laterality: N/A;   TRANSURETHRAL RESECTION OF PROSTATE N/A 07/16/2017   Procedure: TRANSURETHRAL RESECTION OF THE PROSTATE (TURP);  Surgeon: Hollice Espy, MD;  Location: [facility] ORS;  Service: Urology;  Laterality: N/A;   UMBILICAL HERNIA REPAIR  12/25/2016   Procedure: HERNIA REPAIR UMBILICAL ADULT;  Surgeon: Jules Husbands, MD;  Location: [facility] ORS;  Service: General;;    Prior to Admission medications   Medication Sig Start Date End Date Taking? Authorizing Provider  ACCU-CHEK AVIVA PLUS test strip  08/04/17  Yes [provider]  ACCU-CHEK SOFTCLIX LANCETS lancets  09/26/17  Yes [provider]  acetaminophen (TYLENOL) 650 MG CR tablet Take 650 mg by mouth every 8 (eight) hours as needed for pain.    Yes [provider]  amLODipine (NORVASC) 10 MG tablet Take 10 mg  by mouth daily.   Yes [provider]  cetirizine (ZYRTEC) 10 MG tablet Take 10 mg by mouth daily.   Yes [provider]  citalopram (CELEXA) 10 MG tablet Take 10 mg by mouth daily.   Yes [provider]  dicyclomine (BENTYL) 10 MG capsule  05/21/18  Yes [provider]  docusate sodium (COLACE) 100 MG capsule Take 100 mg by mouth 2 (two) times daily.    Yes [provider]  FIBER ADULT GUMMIES PO Take 2 tablets by mouth daily.   Yes [provider]  fluticasone (FLONASE) 50 MCG/ACT nasal spray Place 2 sprays into both nostrils daily.  08/31/13  Yes [provider]  hydrALAZINE (APRESOLINE) 25 MG tablet Take 25 mg by mouth 3 (three) times daily.  08/26/17  Yes [provider]  hydrochlorothiazide (HYDRODIURIL) 25 MG tablet Take 25 mg by mouth daily.    Yes [provider]  ibuprofen (ADVIL) 200 MG tablet Take 600 mg by mouth every 6 (six) hours as needed for moderate pain.   Yes [provider]  lisinopril (PRINIVIL,ZESTRIL) 40 MG tablet Take 40 mg by mouth daily.   Yes [provider]  lovastatin (MEVACOR) 20 MG tablet Take 20 mg by mouth at bedtime.   Yes [provider]  lovastatin (MEVACOR) 40 MG tablet Take 40 mg by mouth daily. 06/07/20  Yes [provider]  metFORMIN (GLUCOPHAGE) 1000 MG tablet Take 1,000 mg by mouth 2 (two) times daily. 05/24/19  Yes [provider]  metoprolol (LOPRESSOR) 100 MG tablet Take 100 mg by mouth 2 (two) times daily.  05/10/13  Yes [provider]  Multiple Vitamin (MULTI-VITAMIN) tablet Take 1 tablet by mouth daily.    Yes [provider]  nystatin cream (MYCOSTATIN) Apply topically. 07/05/20  Yes [provider]  omeprazole (PRILOSEC) 40 MG capsule Take 1 capsule (40 mg total) by mouth in the morning and at bedtime. 12/13/19  Yes Pabon, [place], MD  tamsulosin (FLOMAX) 0.4 MG CAPS capsule Take 0.4 mg by mouth daily after supper.  05/25/18  Yes [provider]  linaclotide Rolan Lipa) 290 MCG CAPS capsule Take 1 capsule (290 mcg total) by mouth daily before breakfast. 01/28/18 02/09/20  [name], MD    Allergies as of  07/21/2020   (No Known Allergies)    Family History  Problem Relation Age of Onset   Cancer Mother        blood stream   High blood pressure Mother    Hypertension Sister    Anemia Sister    Prostate cancer Neg Hx    Bladder Cancer Neg Hx    Kidney disease Neg Hx     Social History   Socioeconomic History   Marital status: Married    Spouse name: Not on file   Number of children: Not on file   Years of education: Not on file   Highest education level: Not on file  Occupational History   Not on file  Tobacco Use   Smoking status: Former    Pack years: 0.00    Types: Cigarettes    Quit date: 03/26/1995    Years since quitting: 25.5   Smokeless tobacco: Never  Vaping Use   Vaping Use: Never used  Substance and Sexual Activity   Alcohol use: No   Drug use: No   Sexual activity: Not on file  Other Topics Concern   Not on file  Social History Narrative  Not on file   Social Determinants of Health   Financial Resource Strain: Not on file  Food Insecurity: Not on file  Transportation Needs: Not on file  Physical Activity: Not on file  Stress: Not on file  Social Connections: Not on file  Intimate Partner Violence: Not on file    Review of Systems: See HPI, otherwise negative ROS  Physical Exam: BP (!) 172/103   Pulse 65   Temp 98 F (36.7 C) (Temporal)   Resp 18   Ht 5\' 9"  (1.753 m)   Wt 85.7 kg   SpO2 96%   BMI 27.91 kg/m  General:   Alert,  pleasant and cooperative in NAD Head:  Normocephalic and atraumatic. Neck:  Supple; no masses or thyromegaly. Lungs:  Clear throughout to auscultation, normal respiratory effort.    Heart:  +S1, +S2, Regular rate and rhythm, No edema. Abdomen:  Soft, nontender and nondistended. Normal bowel sounds, without guarding, and without rebound.   Neurologic:  Alert and  oriented x4;  grossly normal neurologically.  Impression/Plan: Brandon Benjamin is here for an colonoscopy to be performed for surveillance due to prior  history of colon polyps   Risks, benefits, limitations, and alternatives regarding  colonoscopy have been reviewed with the patient.  Questions have been answered.  All parties agreeable.   [name], MD  09/18/2020, 7:40 AM

## 2020-09-18 NOTE — Anesthesia Postprocedure Evaluation (Signed)
Anesthesia Post Note  Patient: Brandon Benjamin  Procedure(s) Performed: COLONOSCOPY WITH PROPOFOL  Patient location during evaluation: Endoscopy Anesthesia Type: General Level of consciousness: awake and alert and oriented Pain management: pain level controlled Vital Signs Assessment: post-procedure vital signs reviewed and stable Respiratory status: spontaneous breathing, nonlabored ventilation and respiratory function stable Cardiovascular status: blood pressure returned to baseline and stable Postop Assessment: no signs of nausea or vomiting Anesthetic complications: no   No notable events documented.   Last Vitals:  Vitals:   09/18/20 0821 09/18/20 0831  BP: 120/74 106/88  Pulse:    Resp:    Temp:    SpO2:      Last Pain:  Vitals:   09/18/20 0841  TempSrc:   PainSc: 0-No pain                 Katheen Aslin

## 2020-09-18 NOTE — Anesthesia Procedure Notes (Signed)
Date/Time: 09/18/2020 7:42 AM Performed by: Doreen Salvage, CRNA Pre-anesthesia Checklist: Patient identified, Emergency Drugs available, Suction available and Patient being monitored Patient Re-evaluated:Patient Re-evaluated prior to induction Oxygen Delivery Method: Nasal cannula Induction Type: IV induction Dental Injury: Teeth and Oropharynx as per pre-operative assessment  Comments: Nasal cannula with etCO2 monitoring

## 2020-09-18 NOTE — Op Note (Signed)
[facility] Gastroenterology Patient Name: Brandon Benjamin Procedure Date: 09/18/2020 7:25 AM MRN: [id number] Account #: [id number] Date of Birth: 1945-11-02 Admit Type: Outpatient Age: 75 Room: [facility] ENDO ROOM 4 Gender: Male Note Status: Finalized Procedure:             Colonoscopy Indications:           Surveillance: Personal history of adenomatous polyps                         on last colonoscopy 3 years ago Providers:             [name] MD, MD Referring MD:          Baxter Kail. Rebeca Alert MD, MD (Referring MD) Medicines:             Monitored Anesthesia Care Complications:         No immediate complications. Procedure:             Pre-Anesthesia Assessment:                        - Prior to the procedure, a History and Physical was                         performed, and patient medications, allergies and                         sensitivities were reviewed. The patient's tolerance                         of previous anesthesia was reviewed.                        - The risks and benefits of the procedure and the                         sedation options and risks were discussed with the                         patient. All questions were answered and informed                         consent was obtained.                        - ASA Grade Assessment: II - A patient with mild                         systemic disease.                        After obtaining informed consent, the colonoscope was                         passed under direct vision. Throughout the procedure,                         the patient's blood pressure, pulse, and oxygen                         saturations were monitored  continuously. The                         Colonoscope was introduced through the anus and                         advanced to the the cecum, identified by the                         appendiceal orifice. The colonoscopy was performed                         with ease. The patient  tolerated the procedure well.                         The quality of the bowel preparation was excellent. Findings:      A 3 mm polyp was found in the ascending colon. The polyp was sessile.       The polyp was removed with a cold biopsy forceps. Resection and       retrieval were complete.      Internal hemorrhoids were found during retroflexion. The hemorrhoids       were large and Grade I (internal hemorrhoids that do not prolapse).      The exam was otherwise without abnormality on direct and retroflexion       views. Impression:            - One 3 mm polyp in the ascending colon, removed with                         a cold biopsy forceps. Resected and retrieved.                        - Internal hemorrhoids.                        - The examination was otherwise normal on direct and                         retroflexion views. Recommendation:        - Discharge patient to home (with escort).                        - Resume previous diet.                        - Continue present medications.                        - Await pathology results.                        - Repeat colonoscopy for surveillance based on                         pathology results. Procedure Code(s):     --- Professional ---                        [phone], Colonoscopy, flexible; with biopsy, single or  multiple Diagnosis Code(s):     --- Professional ---                        Z86.010, Personal history of colonic polyps                        K63.5, Polyp of colon                        K64.0, First degree hemorrhoids CPT copyright 2019 American Medical Association. All rights reserved. The codes documented in this report are preliminary and upon coder review [place]  be revised to meet current compliance requirements. [name], MD [name] MD, MD 09/18/2020 8:10:45 AM This report has been signed electronically. Number of Addenda: 0 Note Initiated On: 09/18/2020 7:25 AM Scope Withdrawal  Time: 0 hours 15 minutes 24 seconds  Total Procedure Duration: 0 hours 20 minutes 34 seconds  Estimated Blood Loss:  Estimated blood loss: none.      [facility]

## 2020-09-18 NOTE — Transfer of Care (Signed)
Immediate Anesthesia Transfer of Care Note  Patient: Brandon Benjamin  Procedure(s) Performed: COLONOSCOPY WITH PROPOFOL  Patient Location: Endoscopy Recovery  Anesthesia Type:General  Level of Consciousness: sedated  Airway & Oxygen Therapy: Patient Spontanous Breathing  Post-op Assessment: Report given to RN and Post -op Vital signs reviewed and stable  Post vital signs: Reviewed and stable  Last Vitals:  Vitals Value Taken Time  BP 108/71 09/18/20 0812  Temp 35.9 C 09/18/20 0811  Pulse 57 09/18/20 0813  Resp 17 09/18/20 0813  SpO2 97 % 09/18/20 0813  Vitals shown include unvalidated device data.  Last Pain:  Vitals:   09/18/20 0811  TempSrc: Temporal  PainSc: Asleep         Complications: No notable events documented.

## 2020-09-18 NOTE — Anesthesia Preprocedure Evaluation (Signed)
Anesthesia Evaluation  Patient identified by MRN, date of birth, ID band Patient awake    Reviewed: Allergy & Precautions, NPO status , Patient's Chart, lab work & pertinent test results  History of Anesthesia Complications Negative for: history of anesthetic complications  Airway Mallampati: II  TM Distance: >3 FB Neck ROM: Full    Dental  (+) Edentulous Upper, Edentulous Lower   Pulmonary neg sleep apnea, neg COPD, former smoker,    breath sounds clear to auscultation- rhonchi (-) wheezing      Cardiovascular hypertension, Pt. on medications (-) CAD, (-) Past MI, (-) Cardiac Stents and (-) CABG  Rhythm:Regular Rate:Normal - Systolic murmurs and - Diastolic murmurs    Neuro/Psych neg Seizures Anxiety negative neurological ROS     GI/Hepatic Neg liver ROS, GERD  ,  Endo/Other  diabetes, Oral Hypoglycemic Agents  Renal/GU Renal InsufficiencyRenal disease     Musculoskeletal negative musculoskeletal ROS (+)   Abdominal (+) - obese,   Peds  Hematology  (+) anemia ,   Anesthesia Other Findings Past Medical History: No date: Abdominal hernia No date: Anemia No date: Anxiety No date: Balanitis No date: Balanitis No date: BPH (benign prostatic hyperplasia) No date: Diabetes (HCC) No date: Erectile dysfunction No date: GERD (gastroesophageal reflux disease) No date: History of kidney stones No date: HLD (hyperlipidemia) No date: HTN (hypertension) 09/04/2017: Iron deficiency anemia due to chronic blood loss No date: Over weight No date: Priapism No date: Scrotal cyst No date: Urinary urgency   Reproductive/Obstetrics                             Anesthesia Physical Anesthesia Plan  ASA: 3  Anesthesia Plan: General   Post-op Pain Management:    Induction: Intravenous  PONV Risk Score and Plan: 1 and Propofol infusion  Airway Management Planned: Natural Airway  Additional  Equipment:   Intra-op Plan:   Post-operative Plan:   Informed Consent: I have reviewed the patients History and Physical, chart, labs and discussed the procedure including the risks, benefits and alternatives for the proposed anesthesia with the patient or authorized representative who has indicated his/her understanding and acceptance.     Dental advisory given  Plan Discussed with: CRNA and Anesthesiologist  Anesthesia Plan Comments:         Anesthesia Quick Evaluation

## 2020-09-19 ENCOUNTER — Encounter: Payer: Self-pay | Admitting: Gastroenterology

## 2020-09-19 LAB — SURGICAL PATHOLOGY

## 2020-09-26 ENCOUNTER — Encounter: Payer: Self-pay | Admitting: Gastroenterology

## 2020-09-26 NOTE — Progress Notes (Signed)
ok 

## 2020-11-07 ENCOUNTER — Encounter: Payer: Self-pay | Admitting: Podiatry

## 2020-11-07 ENCOUNTER — Telehealth: Payer: Self-pay | Admitting: Podiatry

## 2020-11-07 NOTE — Telephone Encounter (Signed)
Called patient lvm to reschedule 9/26 appt

## 2020-12-18 ENCOUNTER — Ambulatory Visit: Payer: Medicare Other | Admitting: Podiatry

## 2020-12-21 ENCOUNTER — Other Ambulatory Visit: Payer: Self-pay

## 2020-12-21 ENCOUNTER — Encounter: Payer: Self-pay | Admitting: Podiatry

## 2020-12-21 ENCOUNTER — Ambulatory Visit (INDEPENDENT_AMBULATORY_CARE_PROVIDER_SITE_OTHER): Payer: Medicare Other | Admitting: Podiatry

## 2020-12-21 DIAGNOSIS — M79676 Pain in unspecified toe(s): Secondary | ICD-10-CM

## 2020-12-21 DIAGNOSIS — N189 Chronic kidney disease, unspecified: Secondary | ICD-10-CM | POA: Diagnosis not present

## 2020-12-21 DIAGNOSIS — E119 Type 2 diabetes mellitus without complications: Secondary | ICD-10-CM | POA: Diagnosis not present

## 2020-12-21 DIAGNOSIS — B351 Tinea unguium: Secondary | ICD-10-CM | POA: Diagnosis not present

## 2020-12-21 NOTE — Progress Notes (Signed)
This patient returns to my office for at risk foot care.  This patient requires this care by a professional since this patient will be at risk due to having diabetes and CKD..  This patient is unable to cut nails himself since the patient cannot reach his nails.These nails are painful walking and wearing shoes.  This patient presents for at risk foot care today.  General Appearance  Alert, conversant and in no acute stress.  Vascular  Dorsalis pedis and posterior tibial  pulses are palpable  bilaterally.  Capillary return is within normal limits  bilaterally. Temperature is within normal limits  bilaterally.  Neurologic  Senn-Weinstein monofilament wire test within normal limits  bilaterally. Muscle power within normal limits bilaterally.  Nails Thick disfigured discolored nails with subungual debris  from hallux to fifth toes bilaterally. No evidence of bacterial infection or drainage bilaterally.  Orthopedic  No limitations of motion  feet .  No crepitus or effusions noted.  Hammer toes 2-5  B/L.  Skin  normotropic skin with no porokeratosis noted bilaterally.  No signs of infections or ulcers noted.     Onychomycosis  Pain in right toes  Pain in left toes  Consent was obtained for treatment procedures.   Mechanical debridement of nails 1-5  bilaterally performed with a nail nipper.  Filed with dremel without incident.    Return office visit     3 months                 Told patient to return for periodic foot care and evaluation due to potential at risk complications.   Gardiner Barefoot DPM

## 2021-02-26 ENCOUNTER — Ambulatory Visit: Payer: Medicare Other | Admitting: Podiatry

## 2021-02-27 ENCOUNTER — Ambulatory Visit (INDEPENDENT_AMBULATORY_CARE_PROVIDER_SITE_OTHER): Payer: Medicare Other | Admitting: Gastroenterology

## 2021-02-27 ENCOUNTER — Encounter: Payer: Self-pay | Admitting: Gastroenterology

## 2021-02-27 ENCOUNTER — Other Ambulatory Visit: Payer: Self-pay

## 2021-02-27 VITALS — BP 166/76 | HR 79 | Temp 98.3°F | Wt 189.0 lb

## 2021-02-27 DIAGNOSIS — R197 Diarrhea, unspecified: Secondary | ICD-10-CM

## 2021-02-27 NOTE — Patient Instructions (Addendum)
Please stop chewing gum, soda drinks and Linzess. If you do not see an improvement in 3 days, please do the stool tests. Thank you.

## 2021-02-27 NOTE — Progress Notes (Signed)
[name] MD, MRCP(U.K) [address]  [place]  [place], [place] [address]  Main: [phone]  Fax: [phone]   Primary Care Physician: Letta Median, MD  Primary Gastroenterologist:  Dr. [name]   Chief complaint : Diarrhea   HPI: Brandon Benjamin is a 75 y.o. male Summary of history : History of internal hemorrhoids and constipation.  Internal hemorrhoids were banded in 2021 and completed in [place] 2021.   09/18/2020 colonoscopy showed one 3 mm polyp removed with cold biopsy forceps.  Large internal hemorrhoids noted on retroflexion.  He said that been having diarrhea for about 4 weeks 3-4 bowel movements a day very watery in nature.  No blood in the stool.  Consumes about 2 big bottles of soda every 2 days.  Multiple sticks of chewing gum every day.  He is also on Linzess. Current Outpatient Medications  Medication Sig Dispense Refill   ACCU-CHEK AVIVA PLUS test strip      ACCU-CHEK SOFTCLIX LANCETS lancets      acetaminophen (TYLENOL) 650 MG CR tablet Take 650 mg by mouth every 8 (eight) hours as needed for pain.      amLODipine (NORVASC) 10 MG tablet Take 10 mg by mouth daily.     cetirizine (ZYRTEC) 10 MG tablet Take 10 mg by mouth daily.     citalopram (CELEXA) 10 MG tablet Take 10 mg by mouth daily.     dicyclomine (BENTYL) 10 MG capsule      docusate sodium (COLACE) 100 MG capsule Take 100 mg by mouth 2 (two) times daily.      FIBER ADULT GUMMIES PO Take 2 tablets by mouth daily.     fluticasone (FLONASE) 50 MCG/ACT nasal spray Place 2 sprays into both nostrils daily.      hydrALAZINE (APRESOLINE) 25 MG tablet Take 25 mg by mouth 3 (three) times daily.      hydrochlorothiazide (HYDRODIURIL) 25 MG tablet Take 25 mg by mouth daily.      ibuprofen (ADVIL) 200 MG tablet Take 600 mg by mouth every 6 (six) hours as needed for moderate pain.     lisinopril (PRINIVIL,ZESTRIL) 40 MG tablet Take 40 mg by mouth daily.     lovastatin (MEVACOR) 20 MG tablet Take 20  mg by mouth at bedtime.     lovastatin (MEVACOR) 40 MG tablet Take 40 mg by mouth daily.     metFORMIN (GLUCOPHAGE) 1000 MG tablet Take 1,000 mg by mouth 2 (two) times daily.     Metoprolol Tartrate 75 MG TABS Take 1 tablet by mouth 2 (two) times daily.     Multiple Vitamin (MULTI-VITAMIN) tablet Take 1 tablet by mouth daily.      nystatin cream (MYCOSTATIN) Apply topically.     omeprazole (PRILOSEC) 40 MG capsule Take 1 capsule (40 mg total) by mouth in the morning and at bedtime. 60 capsule 2   tamsulosin (FLOMAX) 0.4 MG CAPS capsule Take 0.4 mg by mouth daily after supper.      linaclotide (LINZESS) 290 MCG CAPS capsule Take 1 capsule (290 mcg total) by mouth daily before breakfast. (Patient not taking: Reported on 02/27/2021) 90 capsule 1   No current facility-administered medications for this visit.    Allergies as of 02/27/2021   (No Known Allergies)    ROS:  General: Negative for anorexia, weight loss, fever, chills, fatigue, weakness. ENT: Negative for hoarseness, difficulty swallowing , nasal congestion. CV: Negative for chest pain, angina, palpitations, dyspnea on exertion, peripheral  edema.  Respiratory: Negative for dyspnea at rest, dyspnea on exertion, cough, sputum, wheezing.  GI: See history of present illness. GU:  Negative for dysuria, hematuria, urinary incontinence, urinary frequency, nocturnal urination.  Endo: Negative for unusual weight change.    Physical Examination:   BP (!) 166/76   Pulse 79   Temp 98.3 F (36.8 C) (Oral)   Wt 189 lb (85.7 kg)   BMI 27.91 kg/m   General: Well-nourished, well-developed in no acute distress.  Eyes: No icterus. Conjunctivae pink. Mouth: Oropharyngeal mucosa moist and pink , no lesions erythema or exudate. Lungs: Clear to auscultation bilaterally. Non-labored. Heart: Regular rate and rhythm, no murmurs rubs or gallops.  Abdomen: Bowel sounds are normal, nontender, nondistended, no hepatosplenomegaly or masses, no  abdominal bruits or hernia , no rebound or guarding.   Extremities: No lower extremity edema. No clubbing or deformities. Neuro: Alert and oriented x 3.  Grossly intact. Skin: Warm and dry, no jaundice.   Psych: Alert and cooperative, normal mood and affect.   Imaging Studies: No results found.  Assessment and Plan:   [name] is a 75 y.o. y/o male here to see me for diarrhea.  Past history of constipation.  I believe that his diarrhea is very likely related to the high quantity of high fructose corn syrup that he is consuming in the form of soda and artificial sugars present and chewing gum which have a laxative effect.  In addition if he is taking the Linzess for constipation which was prescribed previously it could contribute to the diarrhea.  I advised him to stop all of this and check for 3 to 5 days time.  If no better to obtain stool tests which I have ordered.  If the diarrhea persist to come back and see me in a few weeks at that time we Six need to try him to stay off metformin for a few days which has also been associated with diarrhea    Dr [name]  MD,MRCP [facility]) Follow up in as needed

## 2021-03-12 ENCOUNTER — Other Ambulatory Visit: Payer: Self-pay

## 2021-03-12 ENCOUNTER — Ambulatory Visit (INDEPENDENT_AMBULATORY_CARE_PROVIDER_SITE_OTHER): Payer: Medicare Other | Admitting: Podiatry

## 2021-03-12 ENCOUNTER — Encounter: Payer: Self-pay | Admitting: Podiatry

## 2021-03-12 DIAGNOSIS — N189 Chronic kidney disease, unspecified: Secondary | ICD-10-CM | POA: Diagnosis not present

## 2021-03-12 DIAGNOSIS — M79676 Pain in unspecified toe(s): Secondary | ICD-10-CM | POA: Diagnosis not present

## 2021-03-12 DIAGNOSIS — E119 Type 2 diabetes mellitus without complications: Secondary | ICD-10-CM

## 2021-03-12 DIAGNOSIS — B351 Tinea unguium: Secondary | ICD-10-CM

## 2021-03-12 NOTE — Progress Notes (Signed)
This patient returns to my office for at risk foot care.  This patient requires this care by a professional since this patient will be at risk due to having diabetes and CKD..  This patient is unable to cut nails himself since the patient cannot reach his nails.These nails are painful walking and wearing shoes.  This patient presents for at risk foot care today.  General Appearance  Alert, conversant and in no acute stress.  Vascular  Dorsalis pedis and posterior tibial  pulses are palpable  bilaterally.  Capillary return is within normal limits  bilaterally. Temperature is within normal limits  bilaterally.  Neurologic  Senn-Weinstein monofilament wire test within normal limits  bilaterally. Muscle power within normal limits bilaterally.  Nails Thick disfigured discolored nails with subungual debris  from hallux to fifth toes bilaterally. No evidence of bacterial infection or drainage bilaterally.  Orthopedic  No limitations of motion  feet .  No crepitus or effusions noted.  Hammer toes 2-5  B/L.  Skin  normotropic skin with no porokeratosis noted bilaterally.  No signs of infections or ulcers noted.     Onychomycosis  Pain in right toes  Pain in left toes  Consent was obtained for treatment procedures.   Mechanical debridement of nails 1-5  bilaterally performed with a nail nipper.  Filed with dremel without incident.    Return office visit     3 months                 Told patient to return for periodic foot care and evaluation due to potential at risk complications.   Gardiner Barefoot DPM

## 2021-06-14 ENCOUNTER — Ambulatory Visit: Payer: Medicare Other | Admitting: Podiatry

## 2021-12-26 ENCOUNTER — Telehealth: Payer: Self-pay | Admitting: Anesthesiology

## 2021-12-26 NOTE — Telephone Encounter (Signed)
Patient asking if a TPI was done on his neck yesterday. I informed him that it doesn't look like that was done.

## 2021-12-26 NOTE — Telephone Encounter (Signed)
Had procedure yesterday and has a few questions

## 2021-12-31 ENCOUNTER — Ambulatory Visit (INDEPENDENT_AMBULATORY_CARE_PROVIDER_SITE_OTHER): Payer: Medicare Other | Admitting: Surgery

## 2021-12-31 ENCOUNTER — Encounter: Payer: Self-pay | Admitting: Surgery

## 2021-12-31 VITALS — BP 145/82 | HR 65 | Temp 97.7°F | Wt 172.8 lb

## 2021-12-31 DIAGNOSIS — K439 Ventral hernia without obstruction or gangrene: Secondary | ICD-10-CM

## 2021-12-31 DIAGNOSIS — K432 Incisional hernia without obstruction or gangrene: Secondary | ICD-10-CM | POA: Diagnosis not present

## 2021-12-31 NOTE — Patient Instructions (Addendum)
Our surgery scheduler Barbara will call you within 24-48 hours to get you scheduled. If you have not heard from her after 48 hours, please call our office. Have the blue sheet available when she calls to write down important information.   If you have any concerns or questions, please feel free to call our office.   Umbilical Hernia, Adult  A hernia is a bulge of tissue that pushes through an opening between muscles. An umbilical hernia happens in the abdomen, near the belly button (umbilicus). The hernia Tetzloff contain tissues from the small intestine, large intestine, or fatty tissue covering the intestines. Umbilical hernias in adults tend to get worse over time, and they require surgical treatment. There are different types of umbilical hernias, including: Indirect hernia. This type is located just above or below the umbilicus. It is the most common type of umbilical hernia in adults. Direct hernia. This type forms through an opening formed by the umbilicus. Reducible hernia. This type of hernia comes and goes. It Mathers be visible only when you strain, lift something heavy, or cough. This type of hernia can be pushed back into the abdomen (reduced). Incarcerated hernia. This type traps abdominal tissue inside the hernia. This type of hernia cannot be reduced. Strangulated hernia. This type of hernia cuts off blood flow to the tissues inside the hernia. The tissues can start to die if this happens. This type of hernia requires emergency treatment. What are the causes? An umbilical hernia happens when tissue inside the abdomen presses on a weak area of the abdominal muscles. What increases the risk? You Frisbee have a greater risk of this condition if you: Are obese. Have had several pregnancies. Have a buildup of fluid inside your abdomen. Have had surgery that weakens the abdominal muscles. What are the signs or symptoms? The main symptom of this condition is a painless bulge at or near the belly  button. A reducible hernia Campusano be visible only when you strain, lift something heavy, or cough. Other symptoms Miralles include: Dull pain. A feeling of pressure. Symptoms of a strangulated hernia Woodfin include: Pain that gets increasingly worse. Nausea and vomiting. Pain when pressing on the hernia. Skin over the hernia becoming red or purple. Constipation. Blood in the stool. How is this diagnosed? This condition Tandy be diagnosed based on: A physical exam. You Clemon be asked to cough or strain while standing. These actions increase the pressure inside your abdomen and can force the hernia through the opening in your muscles. Your health care provider Mcgilvray try to reduce the hernia by pressing on it. Your symptoms and medical history. How is this treated? Surgery is the only treatment for an umbilical hernia. Surgery for a strangulated hernia is done as soon as possible. If you have a small hernia that is not incarcerated, you Dollard need to lose weight before having surgery. Follow these instructions at home: Lose weight, if told by your health care provider. Do not try to push the hernia back in. Watch your hernia for any changes in color or size. Tell your health care provider if any changes occur. You Randhawa need to avoid activities that increase pressure on your hernia. Do not lift anything that is heavier than 10 lb (4.5 kg), or the limit that you are told, until your health care provider says that it is safe. Take over-the-counter and prescription medicines only as told by your health care provider. Keep all follow-up visits. This is important. Contact a health care   provider if: Your hernia gets larger. Your hernia becomes painful. Get help right away if: You develop sudden, severe pain near the area of your hernia. You have pain as well as nausea or vomiting. You have pain and the skin over your hernia changes color. You develop a fever or chills. Summary A hernia is a bulge of tissue that  pushes through an opening between muscles. An umbilical hernia happens near the belly button. Surgery is the only treatment for an umbilical hernia. Do not try to push your hernia back in. Keep all follow-up visits. This is important. This information is not intended to replace advice given to you by your health care provider. Make sure you discuss any questions you have with your health care provider. Document Revised: 10/18/2019 Document Reviewed: 10/18/2019 Elsevier Patient Education  2023 Elsevier Inc.  

## 2022-01-01 ENCOUNTER — Telehealth: Payer: Self-pay | Admitting: Surgery

## 2022-01-01 NOTE — Telephone Encounter (Signed)
Patient has been advised of Pre-Admission date/time, and Surgery date at [facility].  Surgery Date: 01/08/22 Preadmission Testing Date: 01/03/22 (phone 1p-5p)  Patient has been made aware to call [phone], between 1-3:00pm the day before surgery, to find out what time to arrive for surgery.

## 2022-01-03 ENCOUNTER — Encounter: Payer: Self-pay | Admitting: Surgery

## 2022-01-03 ENCOUNTER — Encounter
Admission: RE | Admit: 2022-01-03 | Discharge: 2022-01-03 | Disposition: A | Discharge status: Home / Self Care | Payer: Medicare Other | Source: Ambulatory Visit | Attending: Surgery | Admitting: Surgery

## 2022-01-03 DIAGNOSIS — Z01818 Encounter for other preprocedural examination: Secondary | ICD-10-CM

## 2022-01-03 DIAGNOSIS — N189 Chronic kidney disease, unspecified: Secondary | ICD-10-CM

## 2022-01-03 DIAGNOSIS — E119 Type 2 diabetes mellitus without complications: Secondary | ICD-10-CM

## 2022-01-03 DIAGNOSIS — I10 Essential (primary) hypertension: Secondary | ICD-10-CM

## 2022-01-03 DIAGNOSIS — D500 Iron deficiency anemia secondary to blood loss (chronic): Secondary | ICD-10-CM

## 2022-01-03 HISTORY — DX: Unspecified osteoarthritis, unspecified site: M19.90

## 2022-01-03 HISTORY — DX: Umbilical hernia without obstruction or gangrene: K42.9

## 2022-01-03 NOTE — Patient Instructions (Addendum)
Your procedure is scheduled on:01-08-22 Tuesday Report to the Registration Desk on the 1st floor of the [facility].Then proceed to the 2nd floor Surgery Desk To find out your arrival time, please call [phone] between 1PM - 3PM on:01-07-22 Monday If your arrival time is 6:00 am, do not arrive prior to that time as the [place] entrance doors do not open until 6:00 am.  REMEMBER: Instructions that are not followed completely Milleson result in serious medical risk, up to and including death; or upon the discretion of your surgeon and anesthesiologist your surgery Mcadoo need to be rescheduled.  Do not eat food after midnight the night before surgery.  No gum chewing, lozengers or hard candies.  You Boisselle however, drink Water up to 2 hours before you are scheduled to arrive for your surgery. Do not drink anything within 2 hours of your scheduled arrival time.  Type 1 and Type 2 diabetics should only drink water.  TAKE THESE MEDICATIONS THE MORNING OF SURGERY WITH A SIP OF WATER: -amLODipine (NORVASC)  -cetirizine (ZYRTEC)  -citalopram (CELEXA)  -hydrALAZINE (APRESOLINE)  -metoprolol succinate (TOPROL-XL)  -tamsulosin (FLOMAX)  -omeprazole (PRILOSEC) -take one the night before and one on the morning of surgery - helps to prevent nausea after surgery.)  Stop your metFORMIN (GLUCOPHAGE) 2 days prior to surgery-Your last dose will be on 01-05-22 Saturday  One week prior to surgery: Stop Anti-inflammatories (NSAIDS) such as Advil, Aleve, Ibuprofen, Motrin, Naproxen, Naprosyn and Aspirin based products such as Excedrin, Goodys Powder, BC Powder. You Timmers however, continue to take Tylenol if needed for pain up until the day of surgery.  Stop ANY OVER THE COUNTER supplements/vitamins NOW (01-03-22) until after surgery.  No Alcohol for 24 hours before or after surgery.  No Smoking including e-cigarettes for 24 hours prior to surgery.  No chewable tobacco products for at least 6 hours prior to  surgery.  No nicotine patches on the day of surgery.  Do not use any "recreational" drugs for at least a week prior to your surgery.  Please be advised that the combination of cocaine and anesthesia Tamburo have negative outcomes, up to and including death. If you test positive for cocaine, your surgery will be cancelled.  On the morning of surgery brush your teeth with toothpaste and water, you Doris rinse your mouth with mouthwash if you wish. Do not swallow any toothpaste or mouthwash.  Use CHG Soap as directed on instruction sheet.  Do not wear jewelry, make-up, hairpins, clips or nail polish.  Do not wear lotions, powders, or perfumes.   Do not shave body from the neck down 48 hours prior to surgery just in case you cut yourself which could leave a site for infection.  Also, freshly shaved skin Royal become irritated if using the CHG soap.  Contact lenses, hearing aids and dentures Flatley not be worn into surgery.  Do not bring valuables to the hospital. [facility] is not responsible for any missing/lost belongings or valuables.   Notify your doctor if there is any change in your medical condition (cold, fever, infection).  Wear comfortable clothing (specific to your surgery type) to the hospital.  After surgery, you can help prevent lung complications by doing breathing exercises.  Take deep breaths and cough every 1-2 hours. Your doctor Hartman order a device called an Incentive Spirometer to help you take deep breaths. When coughing or sneezing, hold a pillow firmly against your incision with both hands. This is called "splinting." Doing this  helps protect your incision. It also decreases belly discomfort.  If you are being admitted to the hospital overnight, leave your suitcase in the car. After surgery it Beauregard be brought to your room.  If you are being discharged the day of surgery, you will not be allowed to drive home. You will need a responsible adult (18 years or older) to drive you  home and stay with you that night.   If you are taking public transportation, you will need to have a responsible adult (18 years or older) with you. Please confirm with your physician that it is acceptable to use public transportation.   Please call the [place] Dept. at [phone] if you have any questions about these instructions.  Surgery Visitation Policy:  Patients undergoing a surgery or procedure Fero have two family members or support persons with them as long as the person is not COVID-19 positive or experiencing its symptoms.

## 2022-01-03 NOTE — H&P (View-Only) (Signed)
Outpatient Surgical Follow Up  01/03/2022  Brandon Benjamin is an 76 y.o. male.   Chief Complaint  Patient presents with   Follow-up    Umbilical hernia    HPI: Brandon Benjamin is a 76 year old male well-known to me with prior history of robotic paraesophageal hernia repair 2 years ago.  In addition to this he did have a cholecystectomy by me with an umbilical hernia repair 5 years ago.  The repair was primary he has done very well.  Over the last few weeks he has noticed a bulge around his umbilicus.  No fevers no chills no evidence of incarceration or strangulation.  He is otherwise doing very well with no reflux symptoms.  He is swallowing extremely well and he is very happy with the results of the fundoplication.  Does have a history of chronic kidney disease with a baseline creatinine of 1.6.  Otherwise BMP is normal.  CBC is normal.  Is able to perform more than 4 METS of activity without any shortness of breath or chest pain  Past Medical History:  Diagnosis Date   Abdominal hernia    Anemia    Anxiety    Balanitis    Balanitis    BPH (benign prostatic hyperplasia)    Diabetes (HCC)    Erectile dysfunction    GERD (gastroesophageal reflux disease)    History of kidney stones    HLD (hyperlipidemia)    HTN (hypertension)    Iron deficiency anemia due to chronic blood loss 09/04/2017   Over weight    Priapism    Scrotal cyst    Urinary urgency     Past Surgical History:  Procedure Laterality Date   CHOLECYSTECTOMY N/A 12/25/2016   Procedure: LAPAROSCOPIC CHOLECYSTECTOMY WITH INTRAOPERATIVE CHOLANGIOGRAM;  Surgeon: Jules Husbands, MD;  Location: [facility] ORS;  Service: General;  Laterality: N/A;   COLONOSCOPY WITH PROPOFOL N/A 09/09/2017   Procedure: COLONOSCOPY WITH PROPOFOL;  Surgeon: [name], MD;  Location: [facility] ENDOSCOPY;  Service: Gastroenterology;  Laterality: N/A;   COLONOSCOPY WITH PROPOFOL N/A 09/18/2020   Procedure: COLONOSCOPY WITH PROPOFOL;  Surgeon: [name], MD;   Location: [facility] ENDOSCOPY;  Service: Gastroenterology;  Laterality: N/A;   CYSTOSCOPY WITH LITHOLAPAXY N/A 07/16/2017   Procedure: CYSTOSCOPY WITH LITHOLAPAXY;  Surgeon: Hollice Espy, MD;  Location: [facility] ORS;  Service: Urology;  Laterality: N/A;   ERCP N/A 11/28/2016   Procedure: ENDOSCOPIC RETROGRADE CHOLANGIOPANCREATOGRAPHY (ERCP);  Surgeon: [name], MD;  Location[facility] ENDOSCOPY;  Service: Endoscopy;  Laterality: N/A;   ESOPHAGOGASTRODUODENOSCOPY (EGD) WITH PROPOFOL N/A 09/09/2017   Procedure: ESOPHAGOGASTRODUODENOSCOPY (EGD) WITH PROPOFOL;  Surgeon: [name], MD;  Location: [facility] ENDOSCOPY;  Service: Gastroenterology;  Laterality: N/A;   ESOPHAGOGASTRODUODENOSCOPY (EGD) WITH PROPOFOL N/A 12/01/2017   Procedure: ESOPHAGOGASTRODUODENOSCOPY (EGD) WITH PROPOFOL;  Surgeon: [name], MD;  Location: [facility] ENDOSCOPY;  Service: Gastroenterology;  Laterality: N/A;   ESOPHAGOGASTRODUODENOSCOPY (EGD) WITH PROPOFOL N/A 02/09/2018   Procedure: ESOPHAGOGASTRODUODENOSCOPY (EGD) WITH PROPOFOL;  Surgeon: [name], MD;  Location: [facility] ENDOSCOPY;  Service: Gastroenterology;  Laterality: N/A;   ESOPHAGOGASTRODUODENOSCOPY (EGD) WITH PROPOFOL N/A 12/28/2019   Procedure: ESOPHAGOGASTRODUODENOSCOPY (EGD) WITH PROPOFOL;  Surgeon: [name], MD;  Location: [facility] ENDOSCOPY;  Service: Endoscopy;  Laterality: N/A;   GIVENS CAPSULE STUDY N/A 12/01/2017   Procedure: GIVENS CAPSULE STUDY;  Surgeon: [name], MD;  Location[facility] ENDOSCOPY;  Service: Gastroenterology;  Laterality: N/A;   HOLEP-LASER ENUCLEATION OF THE PROSTATE WITH MORCELLATION N/A 07/11/2015   Procedure: HOLEP-LASER ENUCLEATION OF THE PROSTATE WITH MORCELLATION;  Surgeon: Hollice Espy, MD;  Location: [facility] ORS;  Service: Urology;  Laterality: N/A;   TRANSURETHRAL RESECTION OF PROSTATE N/A 07/16/2017   Procedure: TRANSURETHRAL RESECTION OF THE PROSTATE (TURP);  Surgeon: Hollice Espy, MD;  Location: [facility] ORS;  Service: Urology;  Laterality: N/A;    UMBILICAL HERNIA REPAIR  12/25/2016   Procedure: HERNIA REPAIR UMBILICAL ADULT;  Surgeon: Jules Husbands, MD;  Location: [facility] ORS;  Service: General;;    Family History  Problem Relation Age of Onset   Cancer Mother        blood stream   High blood pressure Mother    Hypertension Sister    Anemia Sister    Prostate cancer Neg Hx    Bladder Cancer Neg Hx    Kidney disease Neg Hx     Social History:  reports that he quit smoking about 26 years ago. His smoking use included cigarettes. He has never used smokeless tobacco. He reports that he does not drink alcohol and does not use drugs.  Allergies: No Known Allergies  Medications reviewed.    ROS Full ROS performed and is otherwise negative other than what is stated in HPI   BP (!) 145/82   Pulse 65   Temp 97.7 F (36.5 C) (Oral)   Wt 172 lb 12.8 oz (78.4 kg)   SpO2 98%   BMI 25.52 kg/m   Physical Exam Vitals and nursing note reviewed. Exam conducted with a chaperone present.  Constitutional:      General: He is not in acute distress.    Appearance: Normal appearance. He is normal weight. He is not ill-appearing.  Eyes:     General:        Right eye: No discharge.        Left eye: No discharge.  Cardiovascular:     Rate and Rhythm: Normal rate and regular rhythm.     Heart sounds: No murmur heard.    No friction rub.  Pulmonary:     Effort: Pulmonary effort is normal. No respiratory distress.     Breath sounds: Normal breath sounds. No stridor.  Abdominal:     General: Abdomen is flat. There is no distension.     Palpations: Abdomen is soft. There is no mass.     Tenderness: There is no abdominal tenderness. There is no guarding or rebound.     Hernia: No hernia is present.     Comments: Evidence of a supraumbilical ventral hernia measuring 3 cm.  Reducible  Musculoskeletal:        General: No swelling. Normal range of motion.     Cervical back: Normal range of motion and neck supple. No rigidity or  tenderness.  Lymphadenopathy:     Cervical: No cervical adenopathy.  Skin:    General: Skin is warm and dry.     Capillary Refill: Capillary refill takes less than 2 seconds.  Neurological:     General: No focal deficit present.     Mental Status: He is alert and oriented to person, place, and time.  Psychiatric:        Mood and Affect: Mood normal.        Behavior: Behavior normal.        Thought Content: Thought content normal.        Judgment: Judgment normal.     Assessment/Plan: 76 year old male with recurrent incisional ventral hernia 3 cm after a robotic fundoplication 2 years ago.  Discussed with the patient in detail definitely recommend  repair.  I do think that we can do a primary repair with mesh.  Procedure discussed with the patient in detail.  Risks, benefits and possible implications including but not limited to: Bleeding, infection injury to adjacent structures to include bowel, chronic pain recurrence.  I do think that we can do this in an ambulatory setting. Note that I spent 40 minutes in this encounter including personally reviewing imaging studies, medical records, coordinating his care, placing orders, counseling the patient and performing appropriate documentation   Caroleen Hamman, MD [place] Surgeon

## 2022-01-03 NOTE — Progress Notes (Signed)
Outpatient Surgical Follow Up  01/03/2022  Brandon Benjamin is an 76 y.o. male.   Chief Complaint  Patient presents with   Follow-up    Umbilical hernia    HPI: Brandon Benjamin is a 76 year old male well-known to me with prior history of robotic paraesophageal hernia repair 2 years ago.  In addition to this he did have a cholecystectomy by me with an umbilical hernia repair 5 years ago.  The repair was primary he has done very well.  Over the last few weeks he has noticed a bulge around his umbilicus.  No fevers no chills no evidence of incarceration or strangulation.  He is otherwise doing very well with no reflux symptoms.  He is swallowing extremely well and he is very happy with the results of the fundoplication.  Does have a history of chronic kidney disease with a baseline creatinine of 1.6.  Otherwise BMP is normal.  CBC is normal.  Is able to perform more than 4 METS of activity without any shortness of breath or chest pain  Past Medical History:  Diagnosis Date   Abdominal hernia    Anemia    Anxiety    Balanitis    Balanitis    BPH (benign prostatic hyperplasia)    Diabetes (HCC)    Erectile dysfunction    GERD (gastroesophageal reflux disease)    History of kidney stones    HLD (hyperlipidemia)    HTN (hypertension)    Iron deficiency anemia due to chronic blood loss 09/04/2017   Over weight    Priapism    Scrotal cyst    Urinary urgency     Past Surgical History:  Procedure Laterality Date   CHOLECYSTECTOMY N/A 12/25/2016   Procedure: LAPAROSCOPIC CHOLECYSTECTOMY WITH INTRAOPERATIVE CHOLANGIOGRAM;  Surgeon: Jules Husbands, MD;  Location: [facility] ORS;  Service: General;  Laterality: N/A;   COLONOSCOPY WITH PROPOFOL N/A 09/09/2017   Procedure: COLONOSCOPY WITH PROPOFOL;  Surgeon: [name], MD;  Location: [facility] ENDOSCOPY;  Service: Gastroenterology;  Laterality: N/A;   COLONOSCOPY WITH PROPOFOL N/A 09/18/2020   Procedure: COLONOSCOPY WITH PROPOFOL;  Surgeon: [name], MD;   Location: [facility] ENDOSCOPY;  Service: Gastroenterology;  Laterality: N/A;   CYSTOSCOPY WITH LITHOLAPAXY N/A 07/16/2017   Procedure: CYSTOSCOPY WITH LITHOLAPAXY;  Surgeon: Hollice Espy, MD;  Location: [facility] ORS;  Service: Urology;  Laterality: N/A;   ERCP N/A 11/28/2016   Procedure: ENDOSCOPIC RETROGRADE CHOLANGIOPANCREATOGRAPHY (ERCP);  Surgeon: [name], MD;  Location: [facility] END[facility]COPY;  Service: Endoscopy;  Laterality: N/A;   ESOPHAGOGASTRODUODENOSCOPY (EGD) WITH PROPOFOL N/A 09/09/2017   Procedure: ESOPHAGOGASTRODUODENOSCOPY (EGD) WITH PROPOFOL;  Surgeon: [name], MD;  Location: [facility] ENDOSCOPY;  Service: Gastroenterology;  Laterality: N/A;   ESOPHAGOGASTRODUODENOSCOPY (EGD) WITH PROPOFOL N/A 12/01/2017   Procedure: ESOPHAGOGASTRODUODENOSCOPY (EGD) WITH PROPOFOL;  Surgeon: [name], MD;  Location: [facility] ENDOSCOPY;  Service: Gastroenterology;  Laterality: N/A;   ESOPHAGOGASTRODUODENOSCOPY (EGD) WITH PROPOFOL N/A 02/09/2018   Procedure: ESOPHAGOGASTRODUODENOSCOPY (EGD) WITH PROPOFOL;  Surgeon: [name], MD;  Location: [facility] ENDOSCOPY;  Service: Gastroenterology;  Laterality: N/A;   ESOPHAGOGASTRODUODENOSCOPY (EGD) WITH PROPOFOL N/A 12/28/2019   Procedure: ESOPHAGOGASTRODUODENOSCOPY (EGD) WITH PROPOFOL;  Surgeon: [name], MD;  Location: [facility] ENDOSCOPY;  Service: Endoscopy;  Laterality: N/A;   GIVENS CAPSULE STUDY N/A 12/01/2017   Procedure: GIVENS CAPSULE STUDY;  Surgeon: [name], MD;  Location: [facility] ENDOSCOPY;  Service: Gastroenterology;  Laterality: N/A;   HOLEP-LASER ENUCLEATION OF THE PROSTATE WITH MORCELLATION N/A 07/11/2015   Procedure: HOLEP-LASER ENUCLEATION OF THE PROSTATE WITH MORCELLATION;  Surgeon: Hollice Espy, MD;  Location: [facility] ORS;  Service: Urology;  Laterality: N/A;   TRANSURETHRAL RESECTION OF PROSTATE N/A 07/16/2017   Procedure: TRANSURETHRAL RESECTION OF THE PROSTATE (TURP);  Surgeon: Hollice Espy, MD;  Location: [facility] ORS;  Service: Urology;  Laterality: N/A;    UMBILICAL HERNIA REPAIR  12/25/2016   Procedure: HERNIA REPAIR UMBILICAL ADULT;  Surgeon: Jules Husbands, MD;  Location: [facility] ORS;  Service: General;;    Family History  Problem Relation Age of Onset   Cancer Mother        blood stream   High blood pressure Mother    Hypertension Sister    Anemia Sister    Prostate cancer Neg Hx    Bladder Cancer Neg Hx    Kidney disease Neg Hx     Social History:  reports that he quit smoking about 26 years ago. His smoking use included cigarettes. He has never used smokeless tobacco. He reports that he does not drink alcohol and does not use drugs.  Allergies: No Known Allergies  Medications reviewed.    ROS Full ROS performed and is otherwise negative other than what is stated in HPI   BP (!) 145/82   Pulse 65   Temp 97.7 F (36.5 C) (Oral)   Wt 172 lb 12.8 oz (78.4 kg)   SpO2 98%   BMI 25.52 kg/m   Physical Exam Vitals and nursing note reviewed. Exam conducted with a chaperone present.  Constitutional:      General: He is not in acute distress.    Appearance: Normal appearance. He is normal weight. He is not ill-appearing.  Eyes:     General:        Right eye: No discharge.        Left eye: No discharge.  Cardiovascular:     Rate and Rhythm: Normal rate and regular rhythm.     Heart sounds: No murmur heard.    No friction rub.  Pulmonary:     Effort: Pulmonary effort is normal. No respiratory distress.     Breath sounds: Normal breath sounds. No stridor.  Abdominal:     General: Abdomen is flat. There is no distension.     Palpations: Abdomen is soft. There is no mass.     Tenderness: There is no abdominal tenderness. There is no guarding or rebound.     Hernia: No hernia is present.     Comments: Evidence of a supraumbilical ventral hernia measuring 3 cm.  Reducible  Musculoskeletal:        General: No swelling. Normal range of motion.     Cervical back: Normal range of motion and neck supple. No rigidity or  tenderness.  Lymphadenopathy:     Cervical: No cervical adenopathy.  Skin:    General: Skin is warm and dry.     Capillary Refill: Capillary refill takes less than 2 seconds.  Neurological:     General: No focal deficit present.     Mental Status: He is alert and oriented to person, place, and time.  Psychiatric:        Mood and Affect: Mood normal.        Behavior: Behavior normal.        Thought Content: Thought content normal.        Judgment: Judgment normal.     Assessment/Plan: 76 year old male with recurrent incisional ventral hernia 3 cm after a robotic fundoplication 2 years ago.  Discussed with the patient in detail definitely recommend  repair.  I do think that we can do a primary repair with mesh.  Procedure discussed with the patient in detail.  Risks, benefits and possible implications including but not limited to: Bleeding, infection injury to adjacent structures to include bowel, chronic pain recurrence.  I do think that we can do this in an ambulatory setting. Note that I spent 40 minutes in this encounter including personally reviewing imaging studies, medical records, coordinating his care, placing orders, counseling the patient and performing appropriate documentation   Caroleen Hamman, MD [place] Surgeon

## 2022-01-04 ENCOUNTER — Encounter
Admission: RE | Admit: 2022-01-04 | Discharge: 2022-01-04 | Disposition: A | Discharge status: Home / Self Care | Payer: Medicare Other | Source: Ambulatory Visit | Attending: Surgery | Admitting: Surgery

## 2022-01-04 DIAGNOSIS — Z01818 Encounter for other preprocedural examination: Secondary | ICD-10-CM | POA: Insufficient documentation

## 2022-01-04 DIAGNOSIS — I10 Essential (primary) hypertension: Secondary | ICD-10-CM | POA: Diagnosis not present

## 2022-01-04 DIAGNOSIS — E119 Type 2 diabetes mellitus without complications: Secondary | ICD-10-CM | POA: Diagnosis not present

## 2022-01-04 DIAGNOSIS — D500 Iron deficiency anemia secondary to blood loss (chronic): Secondary | ICD-10-CM | POA: Insufficient documentation

## 2022-01-04 DIAGNOSIS — N189 Chronic kidney disease, unspecified: Secondary | ICD-10-CM | POA: Insufficient documentation

## 2022-01-04 LAB — BASIC METABOLIC PANEL
Anion gap: 8 (ref 5–15)
BUN: 33 mg/dL — ABNORMAL HIGH (ref 8–23)
CO2: 27 mmol/L (ref 22–32)
Calcium: 10.9 mg/dL — ABNORMAL HIGH (ref 8.9–10.3)
Chloride: 106 mmol/L (ref 98–111)
Creatinine, Ser: 1.79 mg/dL — ABNORMAL HIGH (ref 0.61–1.24)
GFR, Estimated: 39 mL/min — ABNORMAL LOW (ref >60)
Glucose, Bld: 139 mg/dL — ABNORMAL HIGH (ref 70–99)
Potassium: 3.2 mmol/L — ABNORMAL LOW (ref 3.5–5.1)
Sodium: 141 mmol/L (ref 135–145)

## 2022-01-04 LAB — CBC
HCT: 44.1 % (ref 39.0–52.0)
Hemoglobin: 14.7 g/dL (ref 13.0–17.0)
MCH: 29.8 pg (ref 26.0–34.0)
MCHC: 33.3 g/dL (ref 30.0–36.0)
MCV: 89.5 fL (ref 80.0–100.0)
Platelets: 211 10*3/uL (ref 150–400)
RBC: 4.93 MIL/uL (ref 4.22–5.81)
RDW: 13.7 % (ref 11.5–15.5)
WBC: 5.5 10*3/uL (ref 4.0–10.5)
nRBC: 0 % (ref 0.0–0.2)

## 2022-01-04 LAB — HEMOGLOBIN A1C
Hgb A1c MFr Bld: 5.9 % — ABNORMAL HIGH (ref 4.8–5.6)
Mean Plasma Glucose: 122.63 mg/dL

## 2022-01-08 ENCOUNTER — Other Ambulatory Visit: Payer: Self-pay

## 2022-01-08 ENCOUNTER — Ambulatory Visit: Payer: Medicare Other | Admitting: Certified Registered"

## 2022-01-08 ENCOUNTER — Encounter
Admission: RE | Disposition: A | Discharge status: Home / Self Care | Payer: Self-pay | Source: Home / Self Care | Attending: Surgery

## 2022-01-08 ENCOUNTER — Ambulatory Visit
Admission: RE | Admit: 2022-01-08 | Discharge: 2022-01-08 | Disposition: A | Discharge status: Home / Self Care | Payer: Medicare Other | Attending: Surgery | Admitting: Surgery

## 2022-01-08 ENCOUNTER — Encounter: Payer: Self-pay | Admitting: Surgery

## 2022-01-08 ENCOUNTER — Ambulatory Visit: Payer: Medicare Other | Admitting: Urgent Care

## 2022-01-08 DIAGNOSIS — E785 Hyperlipidemia, unspecified: Secondary | ICD-10-CM | POA: Diagnosis not present

## 2022-01-08 DIAGNOSIS — N189 Chronic kidney disease, unspecified: Secondary | ICD-10-CM | POA: Insufficient documentation

## 2022-01-08 DIAGNOSIS — K432 Incisional hernia without obstruction or gangrene: Secondary | ICD-10-CM | POA: Insufficient documentation

## 2022-01-08 DIAGNOSIS — K219 Gastro-esophageal reflux disease without esophagitis: Secondary | ICD-10-CM | POA: Insufficient documentation

## 2022-01-08 DIAGNOSIS — Z6825 Body mass index (BMI) 25.0-25.9, adult: Secondary | ICD-10-CM | POA: Diagnosis not present

## 2022-01-08 DIAGNOSIS — F419 Anxiety disorder, unspecified: Secondary | ICD-10-CM | POA: Insufficient documentation

## 2022-01-08 DIAGNOSIS — Z87891 Personal history of nicotine dependence: Secondary | ICD-10-CM | POA: Diagnosis not present

## 2022-01-08 DIAGNOSIS — E663 Overweight: Secondary | ICD-10-CM | POA: Diagnosis not present

## 2022-01-08 DIAGNOSIS — I129 Hypertensive chronic kidney disease with stage 1 through stage 4 chronic kidney disease, or unspecified chronic kidney disease: Secondary | ICD-10-CM | POA: Insufficient documentation

## 2022-01-08 DIAGNOSIS — K439 Ventral hernia without obstruction or gangrene: Secondary | ICD-10-CM | POA: Diagnosis not present

## 2022-01-08 DIAGNOSIS — Z01818 Encounter for other preprocedural examination: Secondary | ICD-10-CM

## 2022-01-08 DIAGNOSIS — E1122 Type 2 diabetes mellitus with diabetic chronic kidney disease: Secondary | ICD-10-CM | POA: Diagnosis not present

## 2022-01-08 HISTORY — PX: INSERTION OF MESH: SHX5868

## 2022-01-08 HISTORY — PX: VENTRAL HERNIA REPAIR: SHX424

## 2022-01-08 LAB — GLUCOSE, CAPILLARY
Glucose-Capillary: 142 mg/dL — ABNORMAL HIGH (ref 70–99)
Glucose-Capillary: 167 mg/dL — ABNORMAL HIGH (ref 70–99)

## 2022-01-08 SURGERY — REPAIR, HERNIA, VENTRAL
Anesthesia: General | Site: Abdomen

## 2022-01-08 MED ORDER — FENTANYL CITRATE (PF) 100 MCG/2ML IJ SOLN
INTRAMUSCULAR | Status: AC
  Filled 2022-01-08: qty 2

## 2022-01-08 MED ORDER — FENTANYL CITRATE (PF) 100 MCG/2ML IJ SOLN
INTRAMUSCULAR | Status: DC | PRN
  Administered 2022-01-08: 50 ug via INTRAVENOUS
  Administered 2022-01-08 (×2): 25 ug via INTRAVENOUS

## 2022-01-08 MED ORDER — CHLORHEXIDINE GLUCONATE 0.12 % MT SOLN: 15.0000 mL | Freq: Once | OROMUCOSAL | Status: AC

## 2022-01-08 MED ORDER — ROCURONIUM BROMIDE 10 MG/ML (PF) SYRINGE
PREFILLED_SYRINGE | INTRAVENOUS | Status: AC
  Filled 2022-01-08: qty 10

## 2022-01-08 MED ORDER — ORAL CARE MOUTH RINSE: 15.0000 mL | Freq: Once | OROMUCOSAL | Status: AC

## 2022-01-08 MED ORDER — EPHEDRINE SULFATE (PRESSORS) 50 MG/ML IJ SOLN
INTRAMUSCULAR | Status: DC | PRN
  Administered 2022-01-08 (×2): 10 mg via INTRAVENOUS

## 2022-01-08 MED ORDER — BUPIVACAINE LIPOSOME 1.3 % IJ SUSP
INTRAMUSCULAR | Status: AC
  Filled 2022-01-08: qty 20

## 2022-01-08 MED ORDER — ONDANSETRON HCL 4 MG/2ML IJ SOLN
INTRAMUSCULAR | Status: DC | PRN
  Administered 2022-01-08: 4 mg via INTRAVENOUS

## 2022-01-08 MED ORDER — GABAPENTIN 300 MG PO CAPS
ORAL_CAPSULE | ORAL | Status: AC
  Administered 2022-01-08: 300 mg via ORAL
  Filled 2022-01-08: qty 1

## 2022-01-08 MED ORDER — LIDOCAINE HCL (PF) 2 % IJ SOLN
INTRAMUSCULAR | Status: AC
  Filled 2022-01-08: qty 5

## 2022-01-08 MED ORDER — HYDROCODONE-ACETAMINOPHEN 5-325 MG PO TABS: 1.0000 | ORAL_TABLET | ORAL | 0 refills | Status: DC | PRN

## 2022-01-08 MED ORDER — CEFAZOLIN SODIUM-DEXTROSE 2-4 GM/100ML-% IV SOLN
INTRAVENOUS | Status: AC
  Filled 2022-01-08: qty 100

## 2022-01-08 MED ORDER — DEXAMETHASONE SODIUM PHOSPHATE 10 MG/ML IJ SOLN
INTRAMUSCULAR | Status: AC
  Filled 2022-01-08: qty 1

## 2022-01-08 MED ORDER — CEFAZOLIN SODIUM-DEXTROSE 2-4 GM/100ML-% IV SOLN
2.0000 g | INTRAVENOUS | Status: AC
  Administered 2022-01-08: 2 g via INTRAVENOUS

## 2022-01-08 MED ORDER — CHLORHEXIDINE GLUCONATE 0.12 % MT SOLN
OROMUCOSAL | Status: AC
  Administered 2022-01-08: 15 mL via OROMUCOSAL
  Filled 2022-01-08: qty 15

## 2022-01-08 MED ORDER — EPHEDRINE 5 MG/ML INJ
INTRAVENOUS | Status: AC
  Filled 2022-01-08: qty 5

## 2022-01-08 MED ORDER — OXYCODONE HCL 5 MG/5ML PO SOLN: 5.0000 mg | Freq: Once | ORAL | Status: AC | PRN

## 2022-01-08 MED ORDER — PROPOFOL 10 MG/ML IV BOLUS
INTRAVENOUS | Status: AC
  Filled 2022-01-08: qty 20

## 2022-01-08 MED ORDER — BUPIVACAINE-EPINEPHRINE 0.25% -1:200000 IJ SOLN
INTRAMUSCULAR | Status: DC | PRN
  Administered 2022-01-08: 50 mL via INTRAMUSCULAR

## 2022-01-08 MED ORDER — CELECOXIB 200 MG PO CAPS: 200.0000 mg | ORAL_CAPSULE | ORAL | Status: AC

## 2022-01-08 MED ORDER — GABAPENTIN 300 MG PO CAPS: 300.0000 mg | ORAL_CAPSULE | ORAL | Status: AC

## 2022-01-08 MED ORDER — DEXMEDETOMIDINE HCL IN NACL 80 MCG/20ML IV SOLN
INTRAVENOUS | Status: DC | PRN
  Administered 2022-01-08: 8 ug via INTRAVENOUS

## 2022-01-08 MED ORDER — LIDOCAINE HCL (CARDIAC) PF 100 MG/5ML IV SOSY
PREFILLED_SYRINGE | INTRAVENOUS | Status: DC | PRN
  Administered 2022-01-08: 100 mg via INTRAVENOUS

## 2022-01-08 MED ORDER — OXYCODONE HCL 5 MG PO TABS
ORAL_TABLET | ORAL | Status: AC
  Filled 2022-01-08: qty 1

## 2022-01-08 MED ORDER — ACETAMINOPHEN 500 MG PO TABS
ORAL_TABLET | ORAL | Status: AC
  Administered 2022-01-08: 1000 mg via ORAL
  Filled 2022-01-08: qty 2

## 2022-01-08 MED ORDER — DEXAMETHASONE SODIUM PHOSPHATE 10 MG/ML IJ SOLN
INTRAMUSCULAR | Status: DC | PRN
  Administered 2022-01-08: 5 mg via INTRAVENOUS

## 2022-01-08 MED ORDER — SODIUM CHLORIDE 0.9 % IV SOLN: INTRAVENOUS | Status: DC

## 2022-01-08 MED ORDER — ROCURONIUM BROMIDE 100 MG/10ML IV SOLN
INTRAVENOUS | Status: DC | PRN
  Administered 2022-01-08: 70 mg via INTRAVENOUS

## 2022-01-08 MED ORDER — SUGAMMADEX SODIUM 200 MG/2ML IV SOLN
INTRAVENOUS | Status: DC | PRN
  Administered 2022-01-08: 200 mg via INTRAVENOUS

## 2022-01-08 MED ORDER — ACETAMINOPHEN 500 MG PO TABS: 1000.0000 mg | ORAL_TABLET | ORAL | Status: AC

## 2022-01-08 MED ORDER — CHLORHEXIDINE GLUCONATE CLOTH 2 % EX PADS
6.0000 | MEDICATED_PAD | Freq: Once | CUTANEOUS | Status: AC
  Administered 2022-01-08: 6 via TOPICAL

## 2022-01-08 MED ORDER — BUPIVACAINE-EPINEPHRINE (PF) 0.25% -1:200000 IJ SOLN
INTRAMUSCULAR | Status: AC
  Filled 2022-01-08: qty 30

## 2022-01-08 MED ORDER — OXYCODONE HCL 5 MG PO TABS
5.0000 mg | ORAL_TABLET | Freq: Once | ORAL | Status: AC | PRN
  Administered 2022-01-08: 5 mg via ORAL

## 2022-01-08 MED ORDER — HYDROMORPHONE HCL 1 MG/ML IJ SOLN: 0.2500 mg | INTRAMUSCULAR | Status: DC | PRN

## 2022-01-08 MED ORDER — CELECOXIB 200 MG PO CAPS
ORAL_CAPSULE | ORAL | Status: AC
  Administered 2022-01-08: 200 mg via ORAL
  Filled 2022-01-08: qty 1

## 2022-01-08 MED ORDER — ONDANSETRON HCL 4 MG/2ML IJ SOLN
INTRAMUSCULAR | Status: AC
  Filled 2022-01-08: qty 2

## 2022-01-08 MED ORDER — 0.9 % SODIUM CHLORIDE (POUR BTL) OPTIME
TOPICAL | Status: DC | PRN
  Administered 2022-01-08: 500 mL

## 2022-01-08 MED ORDER — PROPOFOL 10 MG/ML IV BOLUS
INTRAVENOUS | Status: DC | PRN
  Administered 2022-01-08: 150 mg via INTRAVENOUS

## 2022-01-08 SURGICAL SUPPLY — 30 items
ADH SKN CLS LQ APL DERMABOND (GAUZE/BANDAGES/DRESSINGS) ×1
APL PRP STRL LF DISP 70% ISPRP (MISCELLANEOUS) ×1
BLADE CLIPPER SURG (BLADE) ×1 IMPLANT
CHLORAPREP W/TINT 26 (MISCELLANEOUS) ×1 IMPLANT
DERMABOND ADVANCED .7 DNX6 (GAUZE/BANDAGES/DRESSINGS) IMPLANT
DRAPE LAPAROTOMY 100X77 ABD (DRAPES) ×1 IMPLANT
ELECT CAUTERY BLADE 6.4 (BLADE) ×1 IMPLANT
ELECT REM PT RETURN 9FT ADLT (ELECTROSURGICAL) ×1
ELECTRODE REM PT RTRN 9FT ADLT (ELECTROSURGICAL) ×1 IMPLANT
GAUZE SPONGE 4X4 12PLY STRL (GAUZE/BANDAGES/DRESSINGS) ×1 IMPLANT
GLOVE BIO SURGEON STRL SZ7 (GLOVE) ×1 IMPLANT
GOWN STRL REUS W/ TWL LRG LVL3 (GOWN DISPOSABLE) ×2 IMPLANT
GOWN STRL REUS W/TWL LRG LVL3 (GOWN DISPOSABLE) ×2
MANIFOLD NEPTUNE II (INSTRUMENTS) ×1 IMPLANT
MESH VENTRALEX ST 2.5 CRC MED (Mesh General) IMPLANT
NDL HYPO 25X1 1.5 SAFETY (NEEDLE) ×1 IMPLANT
NEEDLE HYPO 22GX1.5 SAFETY (NEEDLE) ×1 IMPLANT
NEEDLE HYPO 25X1 1.5 SAFETY (NEEDLE) ×1 IMPLANT
NS IRRIG 500ML POUR BTL (IV SOLUTION) IMPLANT
PACK BASIN MINOR ARMC (MISCELLANEOUS) ×1 IMPLANT
SPONGE T-LAP 18X18 ~~LOC~~+RFID (SPONGE) ×1 IMPLANT
SUT ETHIBOND 0 MO6 C/R (SUTURE) ×1 IMPLANT
SUT MNCRL 4-0 (SUTURE) ×1
SUT MNCRL 4-0 27XMFL (SUTURE) ×1
SUT VIC AB 2-0 SH 27 (SUTURE) ×2
SUT VIC AB 2-0 SH 27XBRD (SUTURE) ×2 IMPLANT
SUTURE MNCRL 4-0 27XMF (SUTURE) IMPLANT
SYR 20ML LL LF (SYRINGE) ×1 IMPLANT
TAPE MICROFOAM 4IN (TAPE) ×1 IMPLANT
TRAP FLUID SMOKE EVACUATOR (MISCELLANEOUS) ×1 IMPLANT

## 2022-01-08 NOTE — Interval H&P Note (Signed)
History and Physical Interval Note:  01/08/2022 9:52 AM  Brandon Benjamin  has presented today for surgery, with the diagnosis of ventral hernia 3 cm.  The various methods of treatment have been discussed with the patient and family. After consideration of risks, benefits and other options for treatment, the patient has consented to  Procedure(s): HERNIA REPAIR VENTRAL ADULT, open (N/A) as a surgical intervention.  The patient's history has been reviewed, patient examined, no change in status, stable for surgery.  I have reviewed the patient's chart and labs.  Questions were answered to the patient's satisfaction.     [place]

## 2022-01-08 NOTE — Anesthesia Postprocedure Evaluation (Signed)
Anesthesia Post Note  Patient: Americo Vallery Russomanno  Procedure(s) Performed: HERNIA REPAIR VENTRAL ADULT, open (Abdomen) INSERTION OF MESH (Abdomen)  Patient location during evaluation: PACU Anesthesia Type: General Level of consciousness: awake and alert Pain management: pain level controlled Vital Signs Assessment: post-procedure vital signs reviewed and stable Respiratory status: spontaneous breathing, nonlabored ventilation, respiratory function stable and patient connected to nasal cannula oxygen Cardiovascular status: blood pressure returned to baseline and stable Postop Assessment: no apparent nausea or vomiting Anesthetic complications: no   No notable events documented.   Last Vitals:  Vitals:   01/08/22 1153 01/08/22 1204  BP:  135/78  Pulse: 64 67  Resp: 15 16  Temp: (!) 36.4 C (!) 36.1 C  SpO2: 94% 95%    Last Pain:  Vitals:   01/08/22 1204  TempSrc: Temporal  PainSc: 2                  Ilene Qua

## 2022-01-08 NOTE — Discharge Instructions (Addendum)
Ventral Hernia  A ventral hernia is a bulge of tissue from inside the abdomen that pushes through a weak area of the muscles that form the front wall of the abdomen. The tissues inside the abdomen are inside a sac (peritoneum). These tissues include the small intestine, large intestine, and the fatty tissue that covers the intestines (omentum). Sometimes, the bulge that forms a hernia contains intestines. Other hernias contain only fat. Ventral hernias do not go away without surgical treatment. There are several types of ventral hernias. You Jolley have: A hernia at an incision site from previous abdominal surgery (incisional hernia). A hernia just above the belly button (epigastric hernia), or at the belly button (umbilical hernia). These types of hernias can develop from heavy lifting or straining. A hernia that comes and goes (reducible hernia). It Thrun be visible only when you lift or strain. This type of hernia can be pushed back into the abdomen (reduced). A hernia that traps abdominal tissue inside the hernia (incarcerated hernia). This type of hernia does not reduce. A hernia that cuts off blood flow to the tissues inside the hernia (strangulated hernia). The tissues can start to die if this happens. This is a very painful bulge that cannot be reduced. A strangulated hernia is a medical emergency. What are the causes? This condition is caused by abdominal tissue putting pressure on an area of weakness in the abdominal muscles. What increases the risk? The following factors Weldy make you more likely to develop this condition: Being age 74 or older. Being overweight or obese. Having had previous abdominal surgery, especially if there was an infection after surgery. Having had an injury to the abdominal wall. Frequently lifting or pushing heavy objects. Having had several pregnancies. Having a buildup of fluid inside the abdomen (ascites). Straining to have a bowel movement or to urinate. Having  frequent coughing episodes. What are the signs or symptoms? The only symptom of a ventral hernia Capelli be a painless bulge in the abdomen. A reducible hernia Littler be visible only when you strain, cough, or lift. Other symptoms Menser include: Dull pain. A feeling of pressure. Signs and symptoms of a strangulated hernia Grunder include: Increasing pain. Nausea and vomiting. Pain when pressing on the hernia. The skin over the hernia turning red or purple. Constipation. Blood in the stool (feces). How is this diagnosed? This condition Seufert be diagnosed based on: Your symptoms. Your medical history. A physical exam. You Duguay be asked to cough or strain while standing. These actions increase the pressure inside your abdomen and force the hernia through the opening in your muscles. Your health care provider Chiou try to reduce the hernia by gently pushing the hernia back in. Imaging studies, such as an ultrasound or CT scan. How is this treated? This condition is treated with surgery. If you have a strangulated hernia, surgery is done as soon as possible. If your hernia is small and not incarcerated, you Grall be asked to lose some weight before surgery. Follow these instructions at home: Follow instructions from your health care provider about eating or drinking restrictions. If you are overweight, your health care provider Brozek recommend that you increase your activity level and eat a healthier diet. Do not lift anything that is heavier than 10 lb (4.5 kg), or the limit that you are told, until your health care provider says that it is safe. Return to your normal activities as told by your health care provider. Ask your health care provider what activities  are safe for you. You Jarema need to avoid activities that increase pressure on your hernia. Take over-the-counter and prescription medicines only as told by your health care provider. Keep all follow-up visits. This is important. Contact a health care provider  if: Your hernia gets larger. Your hernia becomes painful. Get help right away if: Your hernia becomes increasingly painful. You have pain along with any of the following: Changes in skin color in the area of the hernia. Nausea. Vomiting. Fever. These symptoms Rasnic represent a serious problem that is an emergency. Do not wait to see if the symptoms will go away. Get medical help right away. Call your local emergency services (911 in the [place] Do not drive yourself to the hospital. Summary A ventral hernia is a bulge of tissue from inside the abdomen that pushes through a weak area of the muscles that form the front wall of the abdomen. This condition is treated with surgery, which Carrara be urgent depending on your hernia. Do not lift anything that is heavier than 20 lb , and follow activity instructions from your health care provider. This information is not intended to replace advice given to you by your health care provider. Make sure you discuss any questions you have with your health care provider. Document Revised: 10/29/2019 Document Reviewed: 10/29/2019 Elsevier Patient Education  [place]   The drugs that you were given will stay in your system until tomorrow so for the next 24 hours you should not:  Drive an automobile Make any legal decisions Drink any alcoholic beverage   You Yum resume regular meals tomorrow.  Today it is better to start with liquids and gradually work up to solid foods.  You Newby eat anything you prefer, but it is better to start with liquids, then soup and crackers, and gradually work up to solid foods.   Please notify your doctor immediately if you have any unusual bleeding, trouble breathing, redness and pain at the surgery site, drainage, fever, or pain not relieved by medication.    Additional Instructions:   Please contact your physician with any problems or Same Day Surgery at  [phone], Monday through Friday 6 am to 4 pm, or [place] at [facility] number at [phone].

## 2022-01-08 NOTE — Anesthesia Procedure Notes (Signed)
Procedure Name: Intubation Date/Time: 01/08/2022 10:21 AM  Performed by: Cammie Sickle, CRNAPre-anesthesia Checklist: Patient identified, Patient being monitored, Timeout performed, Emergency Drugs available and Suction available Patient Re-evaluated:Patient Re-evaluated prior to induction Oxygen Delivery Method: Circle system utilized Preoxygenation: Pre-oxygenation with 100% oxygen Induction Type: IV induction Ventilation: Mask ventilation without difficulty Laryngoscope Size: 3 and McGraph Grade View: Grade I Tube type: Oral Tube size: 7.5 mm Number of attempts: 1 Airway Equipment and Method: Stylet Placement Confirmation: ETT inserted through vocal cords under direct vision, positive ETCO2 and breath sounds checked- equal and bilateral Secured at: 22 cm Tube secured with: Tape Dental Injury: Teeth and Oropharynx as per pre-operative assessment

## 2022-01-08 NOTE — Transfer of Care (Signed)
Immediate Anesthesia Transfer of Care Note  Patient: Brandon Benjamin  Procedure(s) Performed: HERNIA REPAIR VENTRAL ADULT, open (Abdomen)  Patient Location: PACU  Anesthesia Type:General  Level of Consciousness: drowsy  Airway & Oxygen Therapy: Patient Spontanous Breathing and Patient connected to face mask oxygen  Post-op Assessment: Report given to RN and Post -op Vital signs reviewed and stable  Post vital signs: Reviewed and stable  Last Vitals:  Vitals Value Taken Time  BP 114/63 01/08/22 1122  Temp    Pulse 70 01/08/22 1124  Resp 19 01/08/22 1124  SpO2 100 % 01/08/22 1124  Vitals shown include unvalidated device data.  Last Pain:  Vitals:   01/08/22 0848  TempSrc: Oral  PainSc: 2          Complications: No notable events documented.

## 2022-01-08 NOTE — Op Note (Signed)
Abdominal Hernia Repair 3 cms using 6.4 ventralex BARD mesh  Pre-operative Diagnosis: Ventral hernia incisional 3 cms  Post-operative Diagnosis: same  Surgeon: Caroleen Hamman, MD FACS  Anesthesia: Gen. with endotracheal tube  Findings: 3 cm incisional hernia ventral  Estimated Blood Loss: 5cc             Specimens: hernia sac       Complications: none              Procedure Details  The patient was seen again in the Holding Room. The benefits, complications, treatment options, and expected outcomes were discussed with the patient. The risks of bleeding, infection, recurrence of symptoms, failure to resolve symptoms, bowel injury, mesh placement, mesh infection, any of which could require further surgery were reviewed with the patient. The likelihood of improving the patient's symptoms with return to their baseline status is good.  The patient and/or family concurred with the proposed plan, giving informed consent.  The patient was taken to Operating Room, identified as Brandon Benjamin and the procedure verified.  A Time Out was held and the above information confirmed.  Prior to the induction of general anesthesia, antibiotic prophylaxis was administered. VTE prophylaxis was in place. General endotracheal anesthesia was then administered and tolerated well. After the induction, the abdomen was prepped with Chloraprep and draped in the sterile fashion. The patient was positioned in the supine position.  Incision was created with a scalpel over the hernia defect. Electrocautery was used to dissect through subcutaneous tissue, the hernia sac was opened and rises of adhesion was performed with Metzenbaum's scissors. Hernia sac was excised. The hernia was measured and the mesh was selected.  Ventralex mesh placed underlay fashion and secured to the abdominal wall with full thickness 4 corner trans fascial ethibond sutures. The mesh laid really nicely again the abdominal wall. I then closed the  hernia defect with interrupted 0 Ethibond   Incisions was closed in a 2 layer fashion with 3-0 Vicryl and 4-0 Monocryl. Dermabond was used to coat the skin.liposomal marcaine was used to inject l the incision site. Patient tolerated procedure well and there were no immediate complications. Needle and laparotomy counts were correct   Caroleen Hamman, MD, FACS

## 2022-01-08 NOTE — Anesthesia Preprocedure Evaluation (Signed)
Anesthesia Evaluation  Patient identified by MRN, date of birth, ID band Patient awake    Reviewed: Allergy & Precautions, NPO status , Patient's Chart, lab work & pertinent test results  History of Anesthesia Complications Negative for: history of anesthetic complications  Airway Mallampati: III  TM Distance: >3 FB Neck ROM: full    Dental  (+) Edentulous Upper, Edentulous Lower   Pulmonary neg pulmonary ROS, former smoker,    Pulmonary exam normal        Cardiovascular hypertension, negative cardio ROS Normal cardiovascular exam     Neuro/Psych PSYCHIATRIC DISORDERS Anxiety negative neurological ROS     GI/Hepatic Neg liver ROS, GERD  ,  Endo/Other  negative endocrine ROSdiabetes, Type 2  Renal/GU Renal disease (CKD)  negative genitourinary   Musculoskeletal   Abdominal   Peds  Hematology  (+) Blood dyscrasia, anemia ,   Anesthesia Other Findings Past Medical History: No date: Abdominal hernia No date: Anemia No date: Anxiety No date: Arthritis No date: Balanitis No date: BPH (benign prostatic hyperplasia) No date: Chronic kidney disease No date: Diabetes (HCC) No date: Erectile dysfunction No date: GERD (gastroesophageal reflux disease) No date: History of kidney stones No date: HLD (hyperlipidemia) No date: HTN (hypertension) 09/04/2017: Iron deficiency anemia due to chronic blood loss No date: Over weight No date: Priapism No date: Scrotal cyst No date: Umbilical hernia No date: Urinary urgency  Past Surgical History: 12/25/2016: CHOLECYSTECTOMY; N/A     Comment:  Procedure: LAPAROSCOPIC CHOLECYSTECTOMY WITH               INTRAOPERATIVE CHOLANGIOGRAM;  Surgeon: Jules Husbands,               MD;  Location: [facility] ORS;  Service: General;  Laterality:               N/A; 09/09/2017: COLONOSCOPY WITH PROPOFOL; N/A     Comment:  Procedure: COLONOSCOPY WITH PROPOFOL;  Surgeon: [name], MD;  Location: [facility] ENDOSCOPY;  Service:               Gastroenterology;  Laterality: N/A; 09/18/2020: COLONOSCOPY WITH PROPOFOL; N/A     Comment:  Procedure: COLONOSCOPY WITH PROPOFOL;  Surgeon: [name], MD;  Location: [facility] ENDOSCOPY;  Service:               Gastroenterology;  Laterality: N/A; 07/16/2017: CYSTOSCOPY WITH LITHOLAPAXY; N/A     Comment:  Procedure: CYSTOSCOPY WITH LITHOLAPAXY;  Surgeon:               Hollice Espy, MD;  Location: [facility] ORS;  Service:               Urology;  Laterality: N/A; 11/28/2016: ERCP; N/A     Comment:  Procedure: ENDOSCOPIC RETROGRADE               CHOLANGIOPANCREATOGRAPHY (ERCP);  Surgeon: [name],               MD;  Location: [facility] ENDOSCOPY;  Service: Endoscopy;                Laterality: N/A; 09/09/2017: ESOPHAGOGASTRODUODENOSCOPY (EGD) WITH PROPOFOL; N/A     Comment:  Procedure: ESOPHAGOGASTRODUODENOSCOPY (EGD) WITH               PROPOFOL;  Surgeon: [name], MD;  Location: [facility]               ENDOSCOPY;  Service: Gastroenterology;  Laterality: N/A; 12/01/2017: ESOPHAGOGASTRODUODENOSCOPY (EGD) WITH PROPOFOL; N/A     Comment:  Procedure: ESOPHAGOGASTRODUODENOSCOPY (EGD) WITH               PROPOFOL;  Surgeon: [name], MD;  Location: [facility]               ENDOSCOPY;  Service: Gastroenterology;  Laterality: N/A; 02/09/2018: ESOPHAGOGASTRODUODENOSCOPY (EGD) WITH PROPOFOL; N/A     Comment:  Procedure: ESOPHAGOGASTRODUODENOSCOPY (EGD) WITH               PROPOFOL;  Surgeon: [name], MD;  Location: [facility]               ENDOSCOPY;  Service: Gastroenterology;  Laterality: N/A; 12/28/2019: ESOPHAGOGASTRODUODENOSCOPY (EGD) WITH PROPOFOL; N/A     Comment:  Procedure: ESOPHAGOGASTRODUODENOSCOPY (EGD) WITH               PROPOFOL;  Surgeon: [name], MD;  Location: [facility]               ENDOSCOPY;  Service: Endoscopy;  Laterality: N/A; 12/01/2017: GIVENS CAPSULE STUDY; N/A     Comment:  Procedure: GIVENS CAPSULE STUDY;  Surgeon:  [name],               MD;  Location: [facility] ENDOSCOPY;  Service:               Gastroenterology;  Laterality: N/A; 07/11/2015: HOLEP-LASER ENUCLEATION OF THE PROSTATE WITH MORCELLATION;  N/A     Comment:  Procedure: HOLEP-LASER ENUCLEATION OF THE PROSTATE WITH               MORCELLATION;  Surgeon: Hollice Espy, MD;  Location:               [facility] ORS;  Service: Urology;  Laterality: N/A; 07/16/2017: TRANSURETHRAL RESECTION OF PROSTATE; N/A     Comment:  Procedure: TRANSURETHRAL RESECTION OF THE PROSTATE               (TURP);  Surgeon: Hollice Espy, MD;  Location: [facility]               ORS;  Service: Urology;  Laterality: N/A; 62/11/5282: UMBILICAL HERNIA REPAIR     Comment:  Procedure: HERNIA REPAIR UMBILICAL ADULT;  Surgeon:               Jules Husbands, MD;  Location: [facility] ORS;  Service:               General;;  BMI    Body Mass Index: 25.40 kg/m      Reproductive/Obstetrics negative OB ROS                             Anesthesia Physical Anesthesia Plan  ASA: 3  Anesthesia Plan: General ETT   Post-op Pain Management: Tylenol PO (pre-op)*, Celebrex PO (pre-op)*, Gabapentin PO (pre-op)* and Dilaudid IV   Induction: Intravenous  PONV Risk Score and Plan: 3 and Ondansetron, Dexamethasone, Midazolam and Treatment Murch vary due to age or medical condition  Airway Management Planned: Oral ETT  Additional Equipment:   Intra-op Plan:   Post-operative Plan: Extubation in OR  Informed Consent: I have reviewed the patients History and Physical, chart, labs and discussed the procedure including the risks, benefits and alternatives for the proposed anesthesia with the patient or authorized  representative who has indicated his/her understanding and acceptance.     Dental Advisory Given  Plan Discussed with: Anesthesiologist, CRNA and Surgeon  Anesthesia Plan Comments: (Patient consented for risks of anesthesia including but not limited to:  - adverse  reactions to medications - damage to eyes, teeth, lips or other oral mucosa - nerve damage due to positioning  - sore throat or hoarseness - Damage to heart, brain, nerves, lungs, other parts of body or loss of life  Patient voiced understanding.)        Anesthesia Quick Evaluation

## 2022-01-09 ENCOUNTER — Encounter: Payer: Self-pay | Admitting: Surgery

## 2022-01-21 ENCOUNTER — Encounter: Payer: Self-pay | Admitting: Surgery

## 2022-01-21 ENCOUNTER — Ambulatory Visit (INDEPENDENT_AMBULATORY_CARE_PROVIDER_SITE_OTHER): Payer: Medicare Other | Admitting: Surgery

## 2022-01-21 VITALS — BP 121/73 | HR 69 | Temp 98.6°F | Wt 170.8 lb

## 2022-01-21 DIAGNOSIS — K439 Ventral hernia without obstruction or gangrene: Secondary | ICD-10-CM

## 2022-01-21 DIAGNOSIS — Z09 Encounter for follow-up examination after completed treatment for conditions other than malignant neoplasm: Secondary | ICD-10-CM

## 2022-01-21 NOTE — Patient Instructions (Signed)
If you have any concerns or questions, please feel free to call our office.   GENERAL POST-OPERATIVE PATIENT INSTRUCTIONS   WOUND CARE INSTRUCTIONS:  Keep a dry clean dressing on the wound if there is drainage. The initial bandage Westergaard be removed after 24 hours.  Once the wound has quit draining you Riordan leave it open to air.  If clothing rubs against the wound or causes irritation and the wound is not draining you Amesquita cover it with a dry dressing during the daytime.  Try to keep the wound dry and avoid ointments on the wound unless directed to do so.  If the wound becomes bright red and painful or starts to drain infected material that is not clear, please contact your physician immediately.  If the wound is mildly pink and has a thick firm ridge underneath it, this is normal, and is referred to as a healing ridge.  This will resolve over the next 4-6 weeks.  BATHING: You Currin shower if you have been informed of this by your surgeon. However, Please do not submerge in a tub, hot tub, or pool until incisions are completely sealed or have been told by your surgeon that you Coutant do so.  DIET:  You Bossi eat any foods that you can tolerate.  It is a good idea to eat a high fiber diet and take in plenty of fluids to prevent constipation.  If you do become constipated you Hardebeck want to take a mild laxative or take ducolax tablets on a daily basis until your bowel habits are regular.  Constipation can be very uncomfortable, along with straining, after recent surgery.  ACTIVITY:  You are encouraged to cough and deep breath or use your incentive spirometer if you were given one, every 15-30 minutes when awake.  This will help prevent respiratory complications and low grade fevers post-operatively if you had a general anesthetic.  You Welch want to hug a pillow when coughing and sneezing to add additional support to the surgical area, if you had abdominal or chest surgery, which will decrease pain during these times.  You  are encouraged to walk and engage in light activity for the next two weeks.  You should not lift more than 20 pounds for 6 weeks total after surgery as it could put you at increased risk for complications.  Twenty pounds is roughly equivalent to a plastic bag of groceries. At that time- Listen to your body when lifting, if you have pain when lifting, stop and then try again in a few days. Soreness after doing exercises or activities of daily living is normal as you get back in to your normal routine.  MEDICATIONS:  Try to take narcotic medications and anti-inflammatory medications, such as tylenol, ibuprofen, naprosyn, etc., with food.  This will minimize stomach upset from the medication.  Should you develop nausea and vomiting from the pain medication, or develop a rash, please discontinue the medication and contact your physician.  You should not drive, make important decisions, or operate machinery when taking narcotic pain medication.  SUNBLOCK Use sun block to incision area over the next year if this area will be exposed to sun. This helps decrease scarring and will allow you avoid a permanent darkened area over your incision.  QUESTIONS:  Please feel free to call our office if you have any questions, and we will be glad to assist you. [phone]

## 2022-01-23 ENCOUNTER — Encounter: Payer: Self-pay | Admitting: Surgery

## 2022-01-23 NOTE — Progress Notes (Signed)
Outpatient Surgical Follow Up  01/23/2022  Brandon Benjamin is an 76 y.o. male.   Chief Complaint  Patient presents with   Routine Post Op    Ventral hernia 01/08/22    HPI: Brandon Benjamin is 2 weeks out following ventral hernia repair with Ventralex.  He is doing very well.  No fevers no chills no pain.  Taking p.o.  Past Medical History:  Diagnosis Date   Abdominal hernia    Anemia    Anxiety    Arthritis    Balanitis    BPH (benign prostatic hyperplasia)    Chronic kidney disease    Diabetes (HCC)    Erectile dysfunction    GERD (gastroesophageal reflux disease)    History of kidney stones    HLD (hyperlipidemia)    HTN (hypertension)    Iron deficiency anemia due to chronic blood loss 09/04/2017   Over weight    Priapism    Scrotal cyst    Umbilical hernia    Urinary urgency     Past Surgical History:  Procedure Laterality Date   CHOLECYSTECTOMY N/A 12/25/2016   Procedure: LAPAROSCOPIC CHOLECYSTECTOMY WITH INTRAOPERATIVE CHOLANGIOGRAM;  Surgeon: Jules Husbands, MD;  Location: [facility] ORS;  Service: General;  Laterality: N/A;   COLONOSCOPY WITH PROPOFOL N/A 09/09/2017   Procedure: COLONOSCOPY WITH PROPOFOL;  Surgeon: [name], MD;  Location: [facility] ENDOSCOPY;  Service: Gastroenterology;  Laterality: N/A;   COLONOSCOPY WITH PROPOFOL N/A 09/18/2020   Procedure: COLONOSCOPY WITH PROPOFOL;  Surgeon: [name], MD;  Location: [facility] ENDOSCOPY;  Service: Gastroenterology;  Laterality: N/A;   CYSTOSCOPY WITH LITHOLAPAXY N/A 07/16/2017   Procedure: CYSTOSCOPY WITH LITHOLAPAXY;  Surgeon: Hollice Espy, MD;  Location: [facility] ORS;  Service: Urology;  Laterality: N/A;   ERCP N/A 11/28/2016   Procedure: ENDOSCOPIC RETROGRADE CHOLANGIOPANCREATOGRAPHY (ERCP);  Surgeon: [name], MD;  Location: [facility] ENDOSCOPY;  Service: Endoscopy;  Laterality: N/A;   ESOPHAGOGASTRODUODENOSCOPY (EGD) WITH PROPOFOL N/A 09/09/2017   Procedure: ESOPHAGOGASTRODUODENOSCOPY (EGD) WITH PROPOFOL;  Surgeon: [name],  MD;  Location: [facility]COPY;  Service: Gastroenterology;  Laterality: N/A;   ESOPHAGOGASTRODUODENOSCOPY (EGD) WITH PROPOFOL N/A 12/01/2017   Procedure: ESOPHAGOGASTRODUODENOSCOPY (EGD) WITH PROPOFOL;  Surgeon: [name], MD;  Location: [facility] ENDOSCOPY;  Service: Gastroenterology;  Laterality: N/A;   ESOPHAGOGASTRODUODENOSCOPY (EGD) WITH PROPOFOL N/A 02/09/2018   Procedure: ESOPHAGOGASTRODUODENOSCOPY (EGD) WITH PROPOFOL;  Surgeon: [name], MD;  Location: [facility] ENDOSCOPY;  Service: Gastroenterology;  Laterality: N/A;   ESOPHAGOGASTRODUODENOSCOPY (EGD) WITH PROPOFOL N/A 12/28/2019   Procedure: ESOPHAGOGASTRODUODENOSCOPY (EGD) WITH PROPOFOL;  Surgeon: [name], MD;  Location: [facility] ENDOSCOPY;  Service: Endoscopy;  Laterality: N/A;   GIVENS CAPSULE STUDY N/A 12/01/2017   Procedure: GIVENS CAPSULE STUDY;  Surgeon: [name], MD;  Location: [facility] ENDOSCOPY;  Service: Gastroenterology;  Laterality: N/A;   HOLEP-LASER ENUCLEATION OF THE PROSTATE WITH MORCELLATION N/A 07/11/2015   Procedure: HOLEP-LASER ENUCLEATION OF THE PROSTATE WITH MORCELLATION;  Surgeon: Hollice Espy, MD;  Location: [facility] ORS;  Service: Urology;  Laterality: N/A;   INSERTION OF MESH N/A 01/08/2022   Procedure: INSERTION OF MESH;  Surgeon: Jules Husbands, MD;  Location: [facility] ORS;  Service: General;  Laterality: N/A;   TRANSURETHRAL RESECTION OF PROSTATE N/A 07/16/2017   Procedure: TRANSURETHRAL RESECTION OF THE PROSTATE (TURP);  Surgeon: Hollice Espy, MD;  Location: [facility] ORS;  Service: Urology;  Laterality: N/A;   UMBILICAL HERNIA REPAIR  12/25/2016   Procedure: HERNIA REPAIR UMBILICAL ADULT;  Surgeon: Jules Husbands, MD;  Location: [facility] ORS;  Service: General;;  VENTRAL HERNIA REPAIR N/A 01/08/2022   Procedure: HERNIA REPAIR VENTRAL ADULT, open;  Surgeon: Jules Husbands, MD;  Location: [facility] ORS;  Service: General;  Laterality: N/A;    Family History  Problem Relation Age of Onset   Cancer Mother        blood stream   High  blood pressure Mother    Hypertension Sister    Anemia Sister    Prostate cancer Neg Hx    Bladder Cancer Neg Hx    Kidney disease Neg Hx     Social History:  reports that he quit smoking about 26 years ago. His smoking use included cigarettes. He has a 15.00 pack-year smoking history. He has never used smokeless tobacco. He reports that he does not drink alcohol and does not use drugs.  Allergies: No Known Allergies  Medications reviewed.    ROS Full ROS performed and is otherwise negative other than what is stated in HPI   BP 121/73   Pulse 69   Temp 98.6 F (37 C) (Oral)   Wt 170 lb 12.8 oz (77.5 kg)   SpO2 97%   BMI 25.22 kg/m   Physical Exam Vitals and nursing note reviewed. Exam conducted with a chaperone present.  Constitutional:      General: He is not in acute distress.    Appearance: Normal appearance. He is normal weight. He is not ill-appearing.  Cardiovascular:     Rate and Rhythm: Normal rate and regular rhythm.  Pulmonary:     Effort: Pulmonary effort is normal. No respiratory distress.     Breath sounds: Normal breath sounds.  Abdominal:     General: Abdomen is flat. There is no distension.     Palpations: Abdomen is soft. There is no mass.     Tenderness: There is no abdominal tenderness. There is no guarding.     Hernia: No hernia is present.     Comments: Incision healing well, no infection or recurrence  Musculoskeletal:        General: No swelling or tenderness. Normal range of motion.  Skin:    General: Skin is warm and dry.     Capillary Refill: Capillary refill takes less than 2 seconds.  Neurological:     General: No focal deficit present.     Mental Status: He is alert and oriented to person, place, and time.  Psychiatric:        Mood and Affect: Mood normal.        Behavior: Behavior normal.        Thought Content: Thought content normal.        Judgment: Judgment normal.        Assessment/Plan:  Adonus is 2 weeks out from  open ventral hernia repair.  Doing well without evidence of infection or recurrence.  Follow-up on a as needed basis Greater than 50% of the 20 minutes  visit was spent in counseling/coordination of care   Caroleen Hamman, MD [place] Surgeon

## 2022-08-17 IMAGING — CT CT ABD-PELV W/ CM
1 of 3 series · 13 of 32 positions shown, 18 images · IV contrast (APPLIED)
Comparison: No priors.

CLINICAL DATA: 74-year-old male with history of right lower
quadrant abdominal pain intermittently for the past 2 months. Some
nausea. No vomiting.

EXAM:
CT ABDOMEN AND PELVIS WITH CONTRAST
TECHNIQUE: Multidetector CT imaging of the abdomen and pelvis was performed
using the standard protocol following bolus administration of
intravenous contrast.
CONTRAST:  100mL OMNIPAQUE IOHEXOL 300 MG/ML  SOLN

[Series 2: axial st · axial · 0.83mm/px · z∈[-952,-532]mm · 13 of 96 slices shown, 18 images]
[im 6/96  soft-tissue]
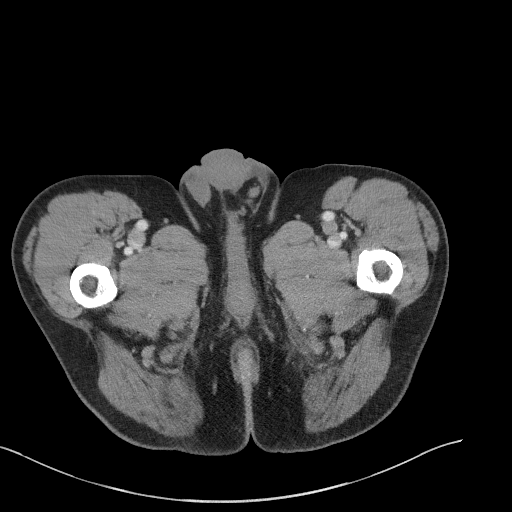
[im 6/96  bone]
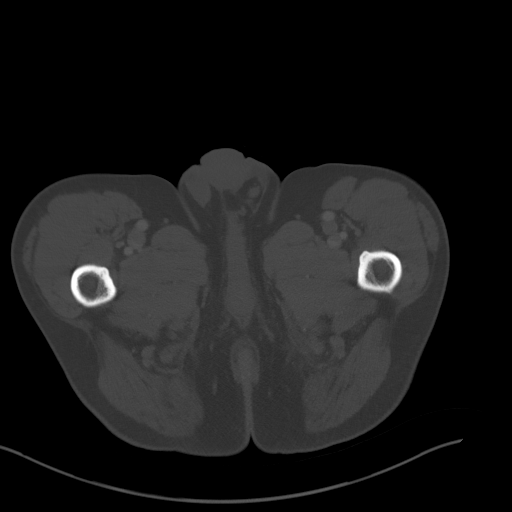
[im 17/96  soft-tissue]
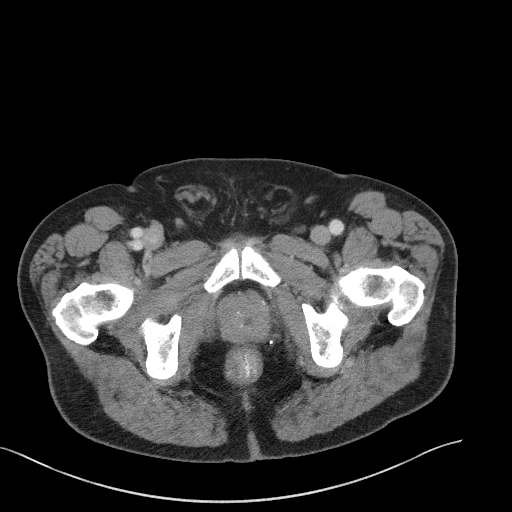
[im 23/96  soft-tissue]
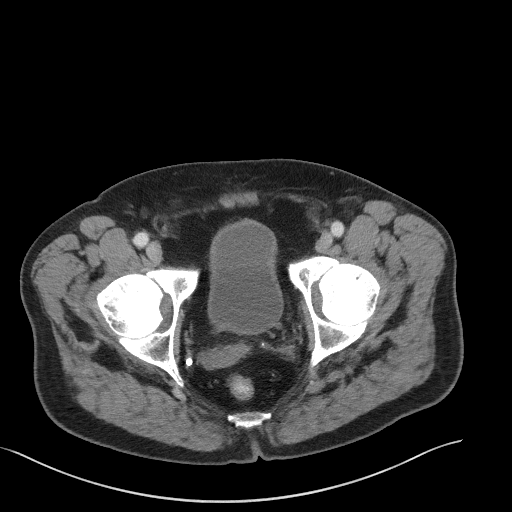
[im 28/96  soft-tissue]
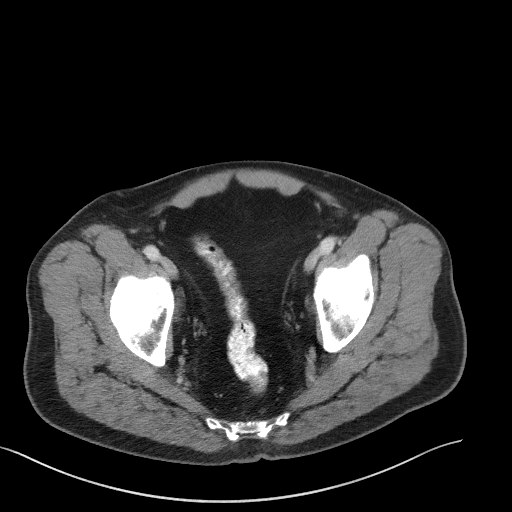
[im 40/96  soft-tissue]
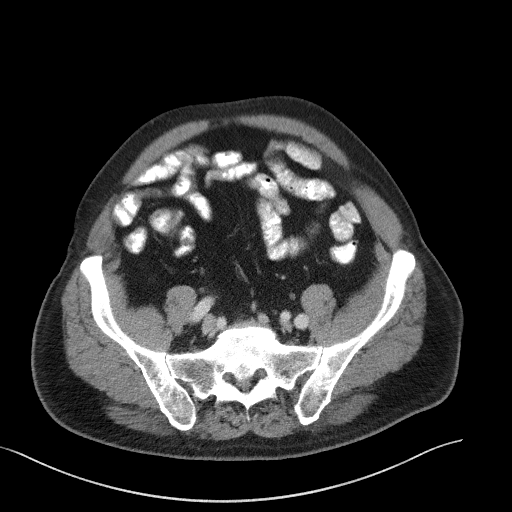
[im 45/96  soft-tissue]
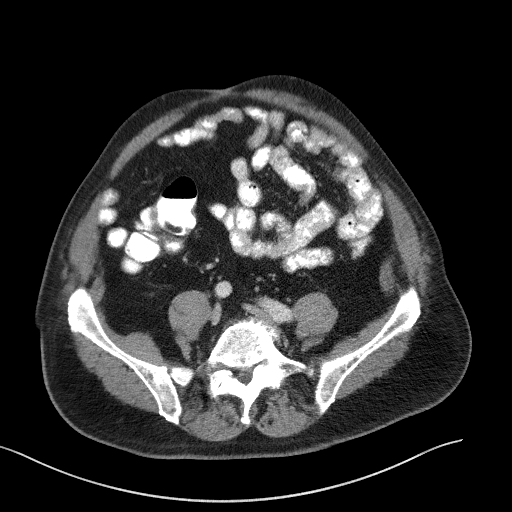
[im 51/96  soft-tissue]
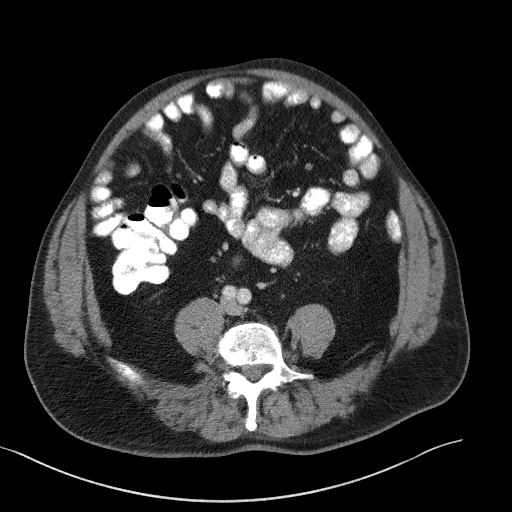
[im 62/96  soft-tissue]
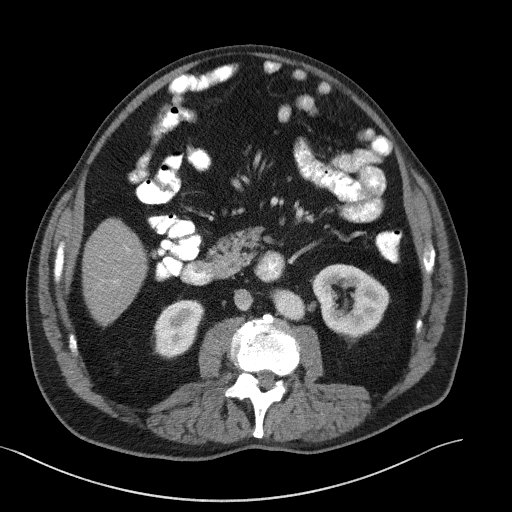
[im 68/96  soft-tissue]
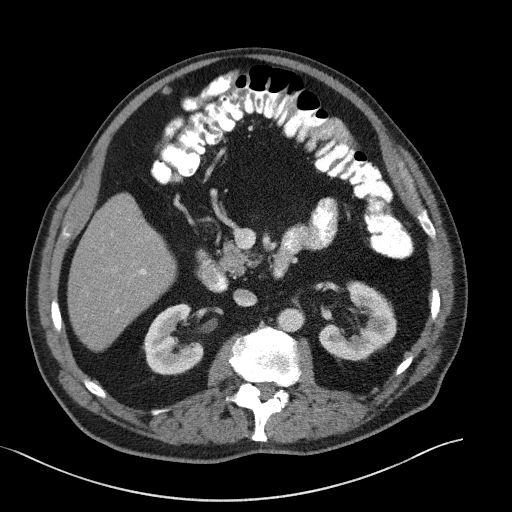
[im 68/96  bone]
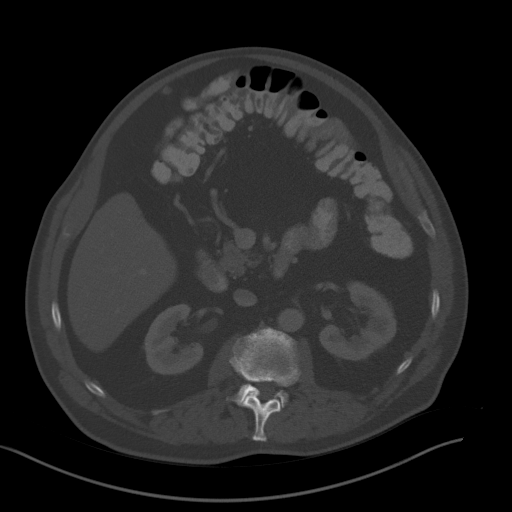
[im 73/96  soft-tissue]
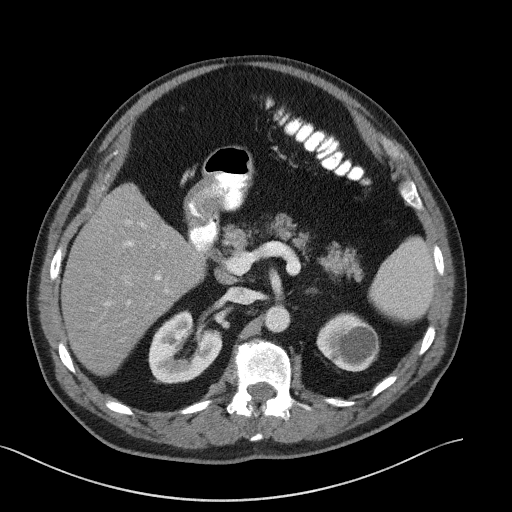
[im 73/96  lung]
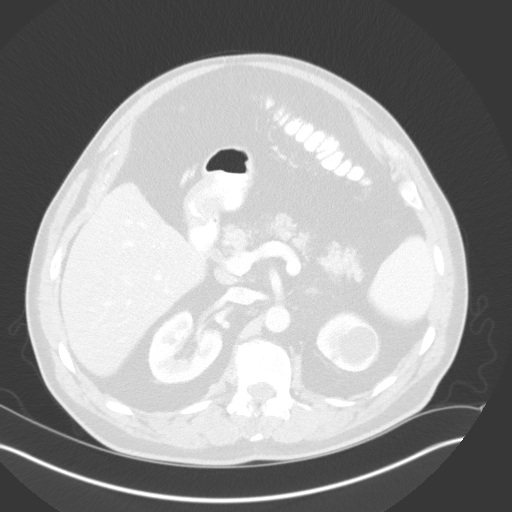
[im 79/96  lung]
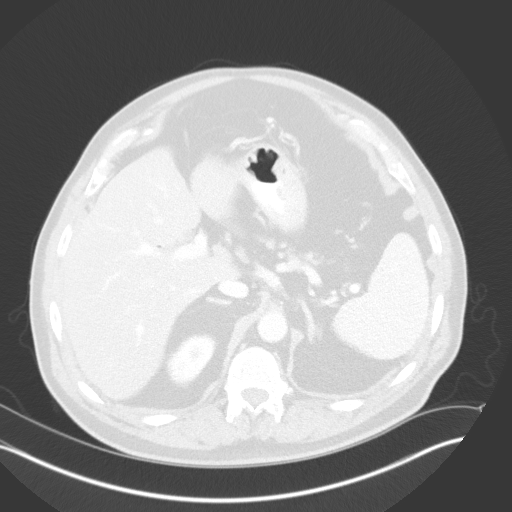
[im 84/96  soft-tissue]
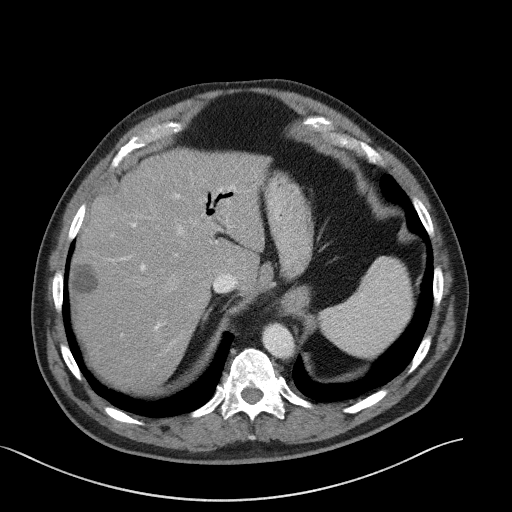
[im 84/96  lung]
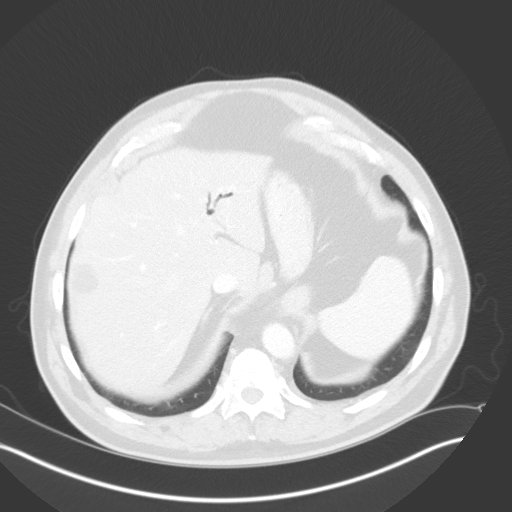
[im 90/96  soft-tissue]
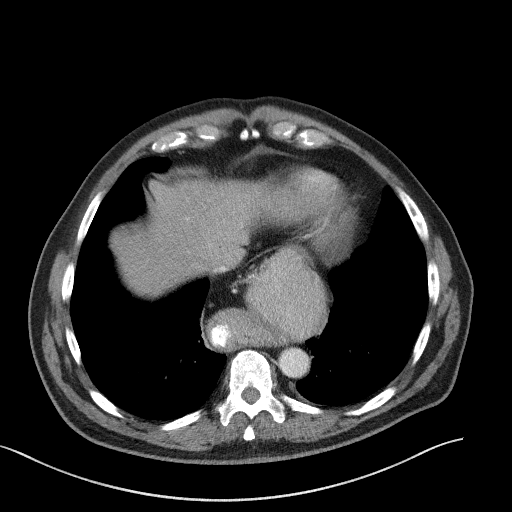
[im 90/96  lung]
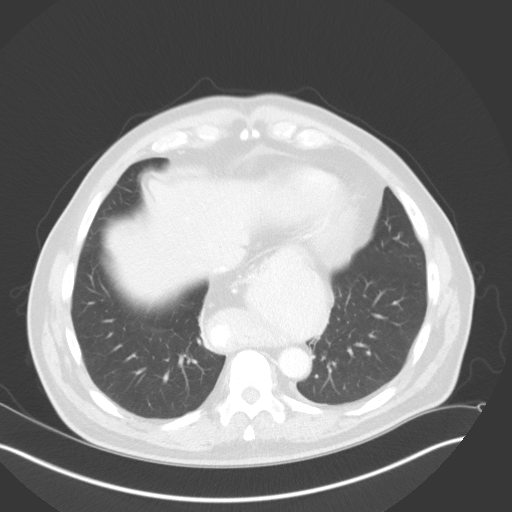

[13 of 32 positions shown; findings below may reference images not displayed]

FINDINGS: Lower chest: Large hiatal hernia.

Hepatobiliary: Diffuse low attenuation throughout the hepatic
parenchyma, suggestive of hepatic steatosis (difficult to say for
certain on today's contrast enhanced CT examination).
Low-attenuation liver lesions, largest of which is a 3.2 cm
low-attenuation lesion in segment 8 of the liver, compatible with
simple cysts. No other suspicious hepatic lesions. No intra or
extrahepatic biliary ductal dilatation. Status post cholecystectomy.
Small amount of pneumobilia, presumably from prior sphincterotomy.

Pancreas: No pancreatic mass. No pancreatic ductal dilatation. No
pancreatic or peripancreatic fluid collections or inflammatory
changes.

Spleen: Unremarkable.

Adrenals/Urinary Tract: Low-attenuation lesions in both kidneys,
largest of which are compatible with simple cysts measuring up to
3.5 cm in the upper pole the left kidney. Other subcentimeter
low-attenuation lesions in both kidneys, too small to definitively
characterize, but statistically likely to represent tiny cysts. No
hydroureteronephrosis. Urinary bladder is normal in appearance.
Bilateral adrenal glands are normal in appearance.

Stomach/Bowel: Normal appearance of the intra-abdominal portion of
the stomach. No pathologic dilatation of small bowel or colon. A few
scattered colonic diverticulae are noted, without surrounding
inflammatory changes to suggest an acute diverticulitis at this
time. Normal appendix.

Vascular/Lymphatic: No significant atherosclerotic disease, aneurysm
or dissection noted in the abdominal or pelvic vasculature. No
lymphadenopathy noted in the abdomen or pelvis.

Reproductive: Prostate gland and seminal vesicles are unremarkable
in appearance.

Other: No significant volume of ascites.  No pneumoperitoneum.

Musculoskeletal: There are no aggressive appearing lytic or blastic
lesions noted in the visualized portions of the skeleton.
IMPRESSION: 1. No acute findings are noted in the abdomen or pelvis to account
for the patient's symptoms. Specifically, the appendix is normal.
2. Hepatic steatosis.
3. Large hiatal hernia.
4. Additional incidental findings, as above.

## 2023-04-21 ENCOUNTER — Ambulatory Visit: Admit: 2023-04-21 | Payer: Medicare Other | Admitting: Ophthalmology

## 2023-04-21 SURGERY — PHACOEMULSIFICATION, CATARACT, WITH IOL INSERTION
Anesthesia: Topical | Laterality: Right

## 2023-05-05 ENCOUNTER — Ambulatory Visit: Admit: 2023-05-05 | Payer: Medicare Other | Admitting: Ophthalmology

## 2023-05-05 SURGERY — PHACOEMULSIFICATION, CATARACT, WITH IOL INSERTION
Anesthesia: Topical | Laterality: Left

## 2024-02-25 ENCOUNTER — Encounter: Payer: Self-pay | Admitting: Surgery

## 2024-02-25 ENCOUNTER — Other Ambulatory Visit: Payer: Self-pay | Admitting: Family Medicine

## 2024-02-25 DIAGNOSIS — R197 Diarrhea, unspecified: Secondary | ICD-10-CM

## 2024-02-25 DIAGNOSIS — R1011 Right upper quadrant pain: Secondary | ICD-10-CM

## 2024-03-01 ENCOUNTER — Encounter: Payer: Self-pay | Admitting: Oncology

## 2024-03-04 ENCOUNTER — Ambulatory Visit: Admission: RE | Admit: 2024-03-04 | Discharge: 2024-03-04 | Attending: Family Medicine | Admitting: Family Medicine

## 2024-03-04 DIAGNOSIS — R197 Diarrhea, unspecified: Secondary | ICD-10-CM | POA: Insufficient documentation

## 2024-03-04 DIAGNOSIS — R1011 Right upper quadrant pain: Secondary | ICD-10-CM | POA: Insufficient documentation

## 2024-03-05 ENCOUNTER — Other Ambulatory Visit: Payer: Self-pay

## 2024-03-05 ENCOUNTER — Emergency Department

## 2024-03-05 ENCOUNTER — Inpatient Hospital Stay
Admission: EM | Admit: 2024-03-05 | Discharge: 2024-03-10 | DRG: 682 | Disposition: A | Discharge status: Home / Self Care | Attending: Hospitalist | Admitting: Hospitalist

## 2024-03-05 DIAGNOSIS — E872 Acidosis, unspecified: Secondary | ICD-10-CM | POA: Diagnosis present

## 2024-03-05 DIAGNOSIS — N179 Acute kidney failure, unspecified: Secondary | ICD-10-CM

## 2024-03-05 DIAGNOSIS — K859 Acute pancreatitis without necrosis or infection, unspecified: Secondary | ICD-10-CM | POA: Diagnosis present

## 2024-03-05 DIAGNOSIS — F32A Depression, unspecified: Secondary | ICD-10-CM | POA: Diagnosis present

## 2024-03-05 DIAGNOSIS — Z79899 Other long term (current) drug therapy: Secondary | ICD-10-CM | POA: Diagnosis not present

## 2024-03-05 DIAGNOSIS — N138 Other obstructive and reflux uropathy: Secondary | ICD-10-CM | POA: Diagnosis present

## 2024-03-05 DIAGNOSIS — Z808 Family history of malignant neoplasm of other organs or systems: Secondary | ICD-10-CM | POA: Diagnosis not present

## 2024-03-05 DIAGNOSIS — I129 Hypertensive chronic kidney disease with stage 1 through stage 4 chronic kidney disease, or unspecified chronic kidney disease: Secondary | ICD-10-CM | POA: Diagnosis present

## 2024-03-05 DIAGNOSIS — N1832 Chronic kidney disease, stage 3b: Secondary | ICD-10-CM | POA: Diagnosis present

## 2024-03-05 DIAGNOSIS — Z832 Family history of diseases of the blood and blood-forming organs and certain disorders involving the immune mechanism: Secondary | ICD-10-CM

## 2024-03-05 DIAGNOSIS — K7689 Other specified diseases of liver: Secondary | ICD-10-CM

## 2024-03-05 DIAGNOSIS — R197 Diarrhea, unspecified: Secondary | ICD-10-CM | POA: Diagnosis present

## 2024-03-05 DIAGNOSIS — K5903 Drug induced constipation: Secondary | ICD-10-CM | POA: Diagnosis present

## 2024-03-05 DIAGNOSIS — N200 Calculus of kidney: Secondary | ICD-10-CM | POA: Diagnosis present

## 2024-03-05 DIAGNOSIS — E785 Hyperlipidemia, unspecified: Secondary | ICD-10-CM | POA: Diagnosis present

## 2024-03-05 DIAGNOSIS — N401 Enlarged prostate with lower urinary tract symptoms: Secondary | ICD-10-CM

## 2024-03-05 DIAGNOSIS — E1129 Type 2 diabetes mellitus with other diabetic kidney complication: Secondary | ICD-10-CM | POA: Diagnosis present

## 2024-03-05 DIAGNOSIS — K219 Gastro-esophageal reflux disease without esophagitis: Secondary | ICD-10-CM | POA: Diagnosis present

## 2024-03-05 DIAGNOSIS — K8500 Idiopathic acute pancreatitis without necrosis or infection: Secondary | ICD-10-CM | POA: Diagnosis not present

## 2024-03-05 DIAGNOSIS — T502X5A Adverse effect of carbonic-anhydrase inhibitors, benzothiadiazides and other diuretics, initial encounter: Secondary | ICD-10-CM | POA: Diagnosis present

## 2024-03-05 DIAGNOSIS — E860 Dehydration: Secondary | ICD-10-CM | POA: Diagnosis present

## 2024-03-05 DIAGNOSIS — Z87891 Personal history of nicotine dependence: Secondary | ICD-10-CM

## 2024-03-05 DIAGNOSIS — I10 Essential (primary) hypertension: Secondary | ICD-10-CM | POA: Diagnosis present

## 2024-03-05 DIAGNOSIS — Z8249 Family history of ischemic heart disease and other diseases of the circulatory system: Secondary | ICD-10-CM

## 2024-03-05 DIAGNOSIS — T402X5A Adverse effect of other opioids, initial encounter: Secondary | ICD-10-CM | POA: Diagnosis present

## 2024-03-05 DIAGNOSIS — F419 Anxiety disorder, unspecified: Secondary | ICD-10-CM | POA: Diagnosis present

## 2024-03-05 DIAGNOSIS — E1122 Type 2 diabetes mellitus with diabetic chronic kidney disease: Secondary | ICD-10-CM | POA: Diagnosis present

## 2024-03-05 DIAGNOSIS — Z7984 Long term (current) use of oral hypoglycemic drugs: Secondary | ICD-10-CM | POA: Diagnosis not present

## 2024-03-05 DIAGNOSIS — F418 Other specified anxiety disorders: Secondary | ICD-10-CM | POA: Diagnosis present

## 2024-03-05 DIAGNOSIS — R1084 Generalized abdominal pain: Principal | ICD-10-CM

## 2024-03-05 LAB — COMPREHENSIVE METABOLIC PANEL WITH GFR
ALT: 21 U/L (ref 0–44)
AST: 20 U/L (ref 15–41)
Albumin: 4.4 g/dL (ref 3.5–5.0)
Alkaline Phosphatase: 76 U/L (ref 38–126)
Anion gap: 17 — ABNORMAL HIGH (ref 5–15)
BUN: 39 mg/dL — ABNORMAL HIGH (ref 8–23)
CO2: 17 mmol/L — ABNORMAL LOW (ref 22–32)
Calcium: 10.6 mg/dL — ABNORMAL HIGH (ref 8.9–10.3)
Chloride: 105 mmol/L (ref 98–111)
Creatinine, Ser: 2.53 mg/dL — ABNORMAL HIGH (ref 0.61–1.24)
GFR, Estimated: 26 mL/min — ABNORMAL LOW (ref >60)
Glucose, Bld: 134 mg/dL — ABNORMAL HIGH (ref 70–99)
Potassium: 4.2 mmol/L (ref 3.5–5.1)
Sodium: 139 mmol/L (ref 135–145)
Total Bilirubin: 0.3 mg/dL (ref 0.0–1.2)
Total Protein: 7 g/dL (ref 6.5–8.1)

## 2024-03-05 LAB — CBC
HCT: 46 % (ref 39.0–52.0)
Hemoglobin: 15.7 g/dL (ref 13.0–17.0)
MCH: 31.1 pg (ref 26.0–34.0)
MCHC: 34.1 g/dL (ref 30.0–36.0)
MCV: 91.1 fL (ref 80.0–100.0)
Platelets: 234 K/uL (ref 150–400)
RBC: 5.05 MIL/uL (ref 4.22–5.81)
RDW: 13.2 % (ref 11.5–15.5)
WBC: 9.5 K/uL (ref 4.0–10.5)
nRBC: 0 % (ref 0.0–0.2)

## 2024-03-05 LAB — TRIGLYCERIDES: Triglycerides: 271 mg/dL — ABNORMAL HIGH (ref <150)

## 2024-03-05 LAB — LIPASE, BLOOD: Lipase: 125 U/L — ABNORMAL HIGH (ref 11–51)

## 2024-03-05 LAB — CBG MONITORING, ED: Glucose-Capillary: 134 mg/dL — ABNORMAL HIGH (ref 70–99)

## 2024-03-05 MED ORDER — PANTOPRAZOLE SODIUM 40 MG PO TBEC
40.0000 mg | DELAYED_RELEASE_TABLET | Freq: Every day | ORAL | Status: DC
  Administered 2024-03-06 – 2024-03-08 (×3): 40 mg via ORAL
  Filled 2024-03-05 (×3): qty 1

## 2024-03-05 MED ORDER — ALUM & MAG HYDROXIDE-SIMETH 200-200-20 MG/5ML PO SUSP
30.0000 mL | Freq: Once | ORAL | Status: AC
  Administered 2024-03-05: 30 mL via ORAL
  Filled 2024-03-05: qty 30

## 2024-03-05 MED ORDER — HYDRALAZINE HCL 50 MG PO TABS
25.0000 mg | ORAL_TABLET | Freq: Three times a day (TID) | ORAL | Status: DC
  Administered 2024-03-06 – 2024-03-09 (×10): 25 mg via ORAL
  Filled 2024-03-05 (×12): qty 1

## 2024-03-05 MED ORDER — PRAVASTATIN SODIUM 20 MG PO TABS
80.0000 mg | ORAL_TABLET | Freq: Every day | ORAL | Status: DC
  Administered 2024-03-06 – 2024-03-09 (×4): 80 mg via ORAL
  Filled 2024-03-05 (×4): qty 4

## 2024-03-05 MED ORDER — MORPHINE SULFATE (PF) 2 MG/ML IV SOLN
2.0000 mg | INTRAVENOUS | Status: DC | PRN
  Administered 2024-03-08: 09:00:00 2 mg via INTRAVENOUS
  Filled 2024-03-05: qty 1

## 2024-03-05 MED ORDER — ONDANSETRON HCL 4 MG/2ML IJ SOLN
4.0000 mg | Freq: Three times a day (TID) | INTRAMUSCULAR | Status: DC | PRN
  Administered 2024-03-08 – 2024-03-09 (×2): 4 mg via INTRAVENOUS
  Filled 2024-03-05 (×2): qty 2

## 2024-03-05 MED ORDER — ONDANSETRON HCL 4 MG/2ML IJ SOLN
4.0000 mg | Freq: Once | INTRAMUSCULAR | Status: AC
  Administered 2024-03-05: 4 mg via INTRAVENOUS
  Filled 2024-03-05: qty 2

## 2024-03-05 MED ORDER — LORATADINE 10 MG PO TABS
10.0000 mg | ORAL_TABLET | Freq: Every day | ORAL | Status: DC
  Administered 2024-03-06 – 2024-03-10 (×5): 10 mg via ORAL
  Filled 2024-03-05 (×5): qty 1

## 2024-03-05 MED ORDER — AMLODIPINE BESYLATE 10 MG PO TABS
10.0000 mg | ORAL_TABLET | Freq: Every day | ORAL | Status: DC
  Administered 2024-03-06 – 2024-03-09 (×4): 10 mg via ORAL
  Filled 2024-03-05 (×4): qty 1

## 2024-03-05 MED ORDER — CITALOPRAM HYDROBROMIDE 20 MG PO TABS
10.0000 mg | ORAL_TABLET | Freq: Every day | ORAL | Status: DC
  Administered 2024-03-06 – 2024-03-10 (×5): 10 mg via ORAL
  Filled 2024-03-05 (×5): qty 1

## 2024-03-05 MED ORDER — ACETAMINOPHEN 325 MG PO TABS
650.0000 mg | ORAL_TABLET | Freq: Four times a day (QID) | ORAL | Status: DC | PRN
  Administered 2024-03-06: 650 mg via ORAL
  Filled 2024-03-05: qty 2

## 2024-03-05 MED ORDER — HYDRALAZINE HCL 20 MG/ML IJ SOLN: 5.0000 mg | INTRAMUSCULAR | Status: DC | PRN

## 2024-03-05 MED ORDER — INSULIN ASPART 100 UNIT/ML IJ SOLN
0.0000 [IU] | Freq: Three times a day (TID) | INTRAMUSCULAR | Status: DC
  Administered 2024-03-06 (×2): 1 [IU] via SUBCUTANEOUS
  Administered 2024-03-08: 17:00:00 2 [IU] via SUBCUTANEOUS
  Administered 2024-03-08 (×2): 1 [IU] via SUBCUTANEOUS
  Administered 2024-03-09: 16:00:00 2 [IU] via SUBCUTANEOUS
  Administered 2024-03-09 – 2024-03-10 (×2): 1 [IU] via SUBCUTANEOUS
  Filled 2024-03-05: qty 2
  Filled 2024-03-05: qty 1
  Filled 2024-03-05: qty 2
  Filled 2024-03-05 (×5): qty 1

## 2024-03-05 MED ORDER — INSULIN ASPART 100 UNIT/ML IJ SOLN
0.0000 [IU] | Freq: Every day | INTRAMUSCULAR | Status: DC
  Administered 2024-03-09: 22:00:00 2 [IU] via SUBCUTANEOUS
  Filled 2024-03-05: qty 2

## 2024-03-05 MED ORDER — OXYCODONE-ACETAMINOPHEN 5-325 MG PO TABS
1.0000 | ORAL_TABLET | ORAL | Status: DC | PRN
  Administered 2024-03-06 – 2024-03-08 (×4): 1 via ORAL
  Filled 2024-03-05 (×5): qty 1

## 2024-03-05 MED ORDER — MORPHINE SULFATE (PF) 4 MG/ML IV SOLN
4.0000 mg | Freq: Once | INTRAVENOUS | Status: AC
  Administered 2024-03-05: 4 mg via INTRAVENOUS
  Filled 2024-03-05: qty 1

## 2024-03-05 MED ORDER — HEPARIN SODIUM (PORCINE) 5000 UNIT/ML IJ SOLN
5000.0000 [IU] | Freq: Three times a day (TID) | INTRAMUSCULAR | Status: DC
  Administered 2024-03-06 – 2024-03-10 (×13): 5000 [IU] via SUBCUTANEOUS
  Filled 2024-03-05 (×13): qty 1

## 2024-03-05 MED ORDER — METOPROLOL SUCCINATE ER 50 MG PO TB24
100.0000 mg | ORAL_TABLET | Freq: Every day | ORAL | Status: DC
  Administered 2024-03-06 – 2024-03-09 (×4): 100 mg via ORAL
  Filled 2024-03-05 (×4): qty 2

## 2024-03-05 MED ORDER — LACTATED RINGERS IV SOLN: INTRAVENOUS | Status: AC

## 2024-03-05 MED ORDER — LIDOCAINE VISCOUS HCL 2 % MT SOLN
15.0000 mL | Freq: Once | OROMUCOSAL | Status: AC
  Administered 2024-03-05: 15 mL via ORAL
  Filled 2024-03-05: qty 15

## 2024-03-05 MED ORDER — LACTATED RINGERS IV BOLUS
1000.0000 mL | Freq: Once | INTRAVENOUS | Status: AC
  Administered 2024-03-05: 1000 mL via INTRAVENOUS

## 2024-03-05 MED ORDER — TAMSULOSIN HCL 0.4 MG PO CAPS
0.4000 mg | ORAL_CAPSULE | Freq: Every day | ORAL | Status: DC
  Administered 2024-03-06 – 2024-03-10 (×5): 0.4 mg via ORAL
  Filled 2024-03-05 (×5): qty 1

## 2024-03-05 NOTE — ED Notes (Signed)
 Pt transported to CT ?

## 2024-03-05 NOTE — ED Triage Notes (Signed)
 Pt to ED via POV from home. Pt reports has been having constant nausea x1 month and is losing weight. Pt reports after eating after he will vomit. Pt reports right mid abdominal pain.

## 2024-03-05 NOTE — H&P (Signed)
 History and Physical    Brandon Benjamin:[id number] DOB: 12-Feb-1946 DOA: 03/05/2024  Referring MD/NP/PA:   PCP: Kandis Stefano Iles, MD   Patient coming from:  The patient is coming from home.     Chief Complaint: Nausea, abdominal pain, diarrhea  HPI: Brandon Benjamin is a 78 y.o. male with medical history significant of HTN, HLD, DM, GERD, depression with anxiety, BPH, CKD stage IIIb, kidney stone, spermatocele, s/p of umbilical hernia, s/p of paraesophageal hernia, chronic hypercalcemia, who presents with nausea, abdominal pain, diarrhea.  He has nausea, dry heaves, abdominal pain for almost 1 month.  He also has intermittent diarrhea with several episodes of watery diarrhea each day.  His abdominal pain is located ion right middle quadrant constant, aching, 5 out of 10 severity, nonradiating, aggravated by eating.  He states that every time he eats he feels nauseated and with worsening abdominal pain.  No fever or chills.  No hematemesis.  Patient does not have chest pain, cough, SOB.  No symptoms of UTI.  Patient does not drink alcohol. Pt was seen by his PCP which ordered RUQ-US  which was done today, showed no acute findings.  RUQ-US  12/12 1. No acute findings. 2. Cholecystectomy, without biliary dilatation. 3. Simple hepatic cyst up to 4 cm, increased from prior; no follow-up recommended. 4. Bilateral simple renal cysts. No follow-up is recommended.  Data reviewed independently and ED Course: pt was found to have lipase 125, WBC 9.5, bicarbonate 17, worsening renal function, calcium 10.6.  Temperature normal, blood pressure 115/70, heart rate 100 --> 56, RR 20, oxygen saturation 95% on room air.  Patient is admitted to MedSurg bed as inpatient.   CT abdomen/pelvis: 1. No acute findings in the abdomen or pelvis. 2. Chronic changes. 3. The pancreas is unremarkable. 4.  The liver demonstrates a hepatic cyst measuring up to 4.1 cm, increased in size from prior CT in 2021. Changes of  pneumobilia are seen.  EKG: I have personally reviewed.  Sinus rhythm, QTc 419, low voltage.   Review of Systems:   General: no fevers, chills, has body weight loss, has poor appetite, has fatigue HEENT: no blurry vision, hearing changes or sore throat Respiratory: no dyspnea, coughing, wheezing CV: no chest pain, no palpitations GI: has nausea, dry heaves, abdominal pain, diarrhea GU: no dysuria, burning on urination, increased urinary frequency, hematuria  Ext: no leg edema Neuro: no unilateral weakness, numbness, or tingling, no vision change or hearing loss Skin: no rash, no skin tear. MSK: No muscle spasm, no deformity, no limitation of range of movement in spin Heme: No easy bruising.  Travel history: No recent long distant travel.   Allergy: Allergies[1]  Past Medical History:  Diagnosis Date   Abdominal hernia    Anemia    Anxiety    Arthritis    Balanitis    BPH (benign prostatic hyperplasia)    Chronic kidney disease    Diabetes (HCC)    Erectile dysfunction    GERD (gastroesophageal reflux disease)    History of kidney stones    HLD (hyperlipidemia)    HTN (hypertension)    Iron  deficiency anemia due to chronic blood loss 09/04/2017   Over weight    Priapism    Scrotal cyst    Umbilical hernia    Urinary urgency     Past Surgical History:  Procedure Laterality Date   CHOLECYSTECTOMY N/A 12/25/2016   Procedure: LAPAROSCOPIC CHOLECYSTECTOMY WITH INTRAOPERATIVE CHOLANGIOGRAM;  Surgeon: Jordis Laneta FALCON, MD;  Location:  [facility] ORS;  Service: General;  Laterality: N/A;   COLONOSCOPY WITH PROPOFOL  N/A 09/09/2017   Procedure: COLONOSCOPY WITH PROPOFOL ;  Surgeon: Therisa Bi, MD;  Location: [facility] ENDOSCOPY;  Service: Gastroenterology;  Laterality: N/A;   COLONOSCOPY WITH PROPOFOL  N/A 09/18/2020   Procedure: COLONOSCOPY WITH PROPOFOL ;  Surgeon: Therisa Bi, MD;  Location: [facility] ENDOSCOPY;  Service: Gastroenterology;  Laterality: N/A;   CYSTOSCOPY WITH LITHOLAPAXY N/A  07/16/2017   Procedure: CYSTOSCOPY WITH LITHOLAPAXY;  Surgeon: Penne Knee, MD;  Location: [facility] ORS;  Service: Urology;  Laterality: N/A;   ERCP N/A 11/28/2016   Procedure: ENDOSCOPIC RETROGRADE CHOLANGIOPANCREATOGRAPHY (ERCP);  Surgeon: Jinny Carmine, MD;  Location: [facility] ENDOSCOPY;  Service: Endoscopy;  Laterality: N/A;   ESOPHAGOGASTRODUODENOSCOPY (EGD) WITH PROPOFOL  N/A 09/09/2017   Procedure: ESOPHAGOGASTRODUODENOSCOPY (EGD) WITH PROPOFOL ;  Surgeon: Therisa Bi, MD;  Location: [facility] ENDOSCOPY;  Service: Gastroenterology;  Laterality: N/A;   ESOPHAGOGASTRODUODENOSCOPY (EGD) WITH PROPOFOL  N/A 12/01/2017   Procedure: ESOPHAGOGASTRODUODENOSCOPY (EGD) WITH PROPOFOL ;  Surgeon: Therisa Bi, MD;  Location: [facility] ENDOSCOPY;  Service: Gastroenterology;  Laterality: N/A;   ESOPHAGOGASTRODUODENOSCOPY (EGD) WITH PROPOFOL  N/A 02/09/2018   Procedure: ESOPHAGOGASTRODUODENOSCOPY (EGD) WITH PROPOFOL ;  Surgeon: Therisa Bi, MD;  Location: [facility] ENDOSCOPY;  Service: Gastroenterology;  Laterality: N/A;   ESOPHAGOGASTRODUODENOSCOPY (EGD) WITH PROPOFOL  N/A 12/28/2019   Procedure: ESOPHAGOGASTRODUODENOSCOPY (EGD) WITH PROPOFOL ;  Surgeon: Jinny Carmine, MD;  Location: [facility] ENDOSCOPY;  Service: Endoscopy;  Laterality: N/A;   GIVENS CAPSULE STUDY N/A 12/01/2017   Procedure: GIVENS CAPSULE STUDY;  Surgeon: Therisa Bi, MD;  Location: [facility] ENDOSCOPY;  Service: Gastroenterology;  Laterality: N/A;   HOLEP-LASER ENUCLEATION OF THE PROSTATE WITH MORCELLATION N/A 07/11/2015   Procedure: HOLEP-LASER ENUCLEATION OF THE PROSTATE WITH MORCELLATION;  Surgeon: Knee Penne, MD;  Location: [facility] ORS;  Service: Urology;  Laterality: N/A;   TRANSURETHRAL RESECTION OF PROSTATE N/A 07/16/2017   Procedure: TRANSURETHRAL RESECTION OF THE PROSTATE (TURP);  Surgeon: Penne Knee, MD;  Location: [facility] ORS;  Service: Urology;  Laterality: N/A;   UMBILICAL HERNIA REPAIR  12/25/2016   Procedure: HERNIA REPAIR UMBILICAL ADULT;  Surgeon: Jordis Laneta FALCON, MD;   Location: [facility] ORS;  Service: General;;   VENTRAL HERNIA REPAIR N/A 01/08/2022   Procedure: HERNIA REPAIR VENTRAL ADULT, open;  Surgeon: Jordis Laneta FALCON, MD;  Location: [facility] ORS;  Service: General;  Laterality: N/A;    Social History:  reports that he quit smoking about 28 years ago. His smoking use included cigarettes. He started smoking about 43 years ago. He has a 15 pack-year smoking history. He has never used smokeless tobacco. He reports that he does not drink alcohol and does not use drugs.  Family History:  Family History  Problem Relation Age of Onset   Cancer Mother        blood stream   High blood pressure Mother    Hypertension Sister    Anemia Sister    Prostate cancer Neg Hx    Bladder Cancer Neg Hx    Kidney disease Neg Hx      Prior to Admission medications  Medication Sig Start Date End Date Taking? Authorizing Provider  ACCU-CHEK AVIVA PLUS test strip  08/04/17   [provider]  ACCU-CHEK SOFTCLIX LANCETS lancets  09/26/17   [provider]  acetaminophen  (TYLENOL ) 650 MG CR tablet Take 650 mg by mouth every 8 (eight) hours as needed for pain.     [provider]  amLODipine  (NORVASC ) 10 MG tablet Take 10 mg by mouth every morning.    [provider]  cetirizine (ZYRTEC) 10 MG tablet Take 10 mg by mouth every morning.    [provider]  citalopram  (CELEXA ) 10 MG tablet Take 10 mg by mouth every morning.    [provider]  dicyclomine  (BENTYL ) 10 MG capsule Take 10 mg by mouth every morning. 05/21/18   [provider]  fluticasone  (FLONASE ) 50 MCG/ACT nasal spray Place 1 spray into both nostrils daily. 08/31/13   [provider]  hydrALAZINE  (APRESOLINE ) 25 MG tablet Take 25 mg by mouth 3 (three) times daily.  08/26/17   [provider]  hydrochlorothiazide  (HYDRODIURIL ) 25 MG tablet Take 25 mg by mouth daily.     [provider]  lisinopril  (PRINIVIL ,ZESTRIL ) 40 MG tablet Take 40 mg  by mouth every morning.    [provider]  lovastatin (MEVACOR) 20 MG tablet Take 20 mg by mouth at bedtime. Pt takes with 40 mg tab to equal 60 mg    [provider]  lovastatin (MEVACOR) 40 MG tablet Take 40 mg by mouth at bedtime. Pt takes with 20 mg tab to equal 60 mg 06/07/20   [provider]  metFORMIN  (GLUCOPHAGE ) 1000 MG tablet Take 1,000 mg by mouth 2 (two) times daily. 05/24/19   [provider]  metoprolol  succinate (TOPROL -XL) 100 MG 24 hr tablet Take 100 mg by mouth every morning. Take with or immediately following a meal.    [provider]  nystatin cream (MYCOSTATIN) Apply 1 Application topically daily. 07/05/20   [provider]  omeprazole  (PRILOSEC) 40 MG capsule Take 1 capsule (40 mg total) by mouth in the morning and at bedtime. Patient taking differently: Take 40 mg by mouth every morning. 12/13/19   Pabon, Laneta FALCON, MD  tamsulosin  (FLOMAX ) 0.4 MG CAPS capsule Take 0.4 mg by mouth daily after breakfast. 05/25/18   [provider]    Physical Exam: Vitals:   03/05/24 1802 03/05/24 1803 03/05/24 2130  BP:  129/74 115/70  Pulse: 100  (!) 56  Resp: 20    Temp: 98 F (36.7 C)    TempSrc: Oral    SpO2: 100%  95%   General: Not in acute distress.  Dry mucous membrane HEENT:       Eyes: PERRL, EOMI, no jaundice       ENT: No discharge from the ears and nose, no pharynx injection, no tonsillar enlargement.        Neck: No JVD, no bruit, no mass felt. Heme: No neck lymph node enlargement. Cardiac: S1/S2, RRR, No murmurs, No gallops or rubs. Respiratory: No rales, wheezing, rhonchi or rubs. GI: Soft, nondistended, has tenderness in RMQ, no rebound pain, no organomegaly, BS present. GU: No hematuria Ext: No pitting leg edema bilaterally. 1+DP/PT pulse bilaterally. Musculoskeletal: No joint deformities, No joint redness or warmth, no limitation of ROM in spin. Skin: No rashes.  Neuro: Alert, oriented X3, cranial  nerves II-XII grossly intact, moves all extremities normally.  Psych: Patient is not psychotic, no suicidal or hemocidal ideation.  Labs on Admission: I have personally reviewed following labs and imaging studies  CBC: Recent Labs  Lab 03/05/24 1804  WBC 9.5  HGB 15.7  HCT 46.0  MCV 91.1  PLT 234   Basic Metabolic Panel: Recent Labs  Lab 03/05/24 1804  NA 139  K 4.2  CL 105  CO2 17*  GLUCOSE 134*  BUN 39*  CREATININE 2.53*  CALCIUM 10.6*   GFR: CrCl cannot be calculated (Unknown ideal weight.). Liver Function Tests:  Recent Labs  Lab 03/05/24 1804  AST 20  ALT 21  ALKPHOS 76  BILITOT 0.3  PROT 7.0  ALBUMIN 4.4   Recent Labs  Lab 03/05/24 1804  LIPASE 125*   No results for input(s): AMMONIA in the last 168 hours. Coagulation Profile: No results for input(s): INR, PROTIME in the last 168 hours. Cardiac Enzymes: No results for input(s): CKTOTAL, CKMB, CKMBINDEX, TROPONINI in the last 168 hours. BNP (last 3 results) No results for input(s): PROBNP in the last 8760 hours. HbA1C: No results for input(s): HGBA1C in the last 72 hours. CBG: No results for input(s): GLUCAP in the last 168 hours. Lipid Profile: Recent Labs    03/05/24 1804  TRIG 271*   Thyroid Function Tests: No results for input(s): TSH, T4TOTAL, FREET4, T3FREE, THYROIDAB in the last 72 hours. Anemia Panel: No results for input(s): VITAMINB12, FOLATE, FERRITIN, TIBC, IRON , RETICCTPCT in the last 72 hours. Urine analysis:    Component Value Date/Time   COLORURINE YELLOW (A) 01/14/2018 1455   APPEARANCEUR Hazy (A) 03/10/2019 1504   LABSPEC 1.019 01/14/2018 1455   PHURINE 6.0 01/14/2018 1455   GLUCOSEU Negative 03/10/2019 1504   HGBUR NEGATIVE 01/14/2018 1455   BILIRUBINUR Negative 03/10/2019 1504   KETONESUR NEGATIVE 01/14/2018 1455   PROTEINUR Negative 03/10/2019 1504   PROTEINUR NEGATIVE 01/14/2018 1455   NITRITE Negative 03/10/2019 1504    NITRITE NEGATIVE 01/14/2018 1455   LEUKOCYTESUR 1+ (A) 03/10/2019 1504   Sepsis Labs: @LABRCNTIP (procalcitonin:4,lacticidven:4) )No results found for this or any previous visit (from the past 240 hours).   Radiological Exams on Admission:   Assessment/Plan Principal Problem:   Acute pancreatitis Active Problems:   Acute renal failure superimposed on stage 3b chronic kidney disease (HCC)   Diarrhea   Metabolic acidosis   HTN (hypertension)   Hyperlipidemia   Type II diabetes mellitus with renal manifestations (HCC)   Hypercalcemia   BPH with urinary obstruction   Liver cyst   Depression with anxiety   Assessment and Plan:  Acute pancreatitis: Lipase 125.  Etiology is not clear.  Liver function normal.  No biliary ductal dilation.  Patient denies alcohol use.  Havard be related to HCTZ and lisinopril  use.  -Admitted to MedSurg bed inpatient -N.p.o. now - As needed morphine , Percocet, Tylenol  for pain - Hold HCTZ and lisinopril  - IV fluid: 1 L LR, Naima 100 cc/h - Check triglyceride level - Repeat lipase in morning  Acute renal failure superimposed on stage 3b chronic kidney disease (HCC): Baseline creatinine 1.67 on 09/29/2023.  His creatinine is 2.53, GFR 26, BUN 37 today.  Likely due to dehydration and continuation of HCTZ, lisinopril . -Hold HCTZ and lisinopril  - IV fluids above  Diarrhea -Check C. difficile and GI pathogen panel  Metabolic acidosis: Bicarbonate 17.  Likely multifactorial etiology, including starvation, worsening renal function, metformin  use - IV fluid as above - Check lactic acid level  HTN (hypertension) -Continue home amlodipine , oral hydralazine , metoprolol  - Hold HCTZ and lisinopril  as above  Hyperlipidemia -Switch lovastatin to pravastatin  in hospital  Type II diabetes mellitus with renal manifestations [facility]): Recent A1c 5.9, well-controlled.  Patient is taking metformin  -SSI  Hypercalcemia: This is chronic issue.  Calcium 10.6.  Patient  had a calcium 10.9 on 01/04/2022.  Patient had elevated TSH 96.5 on 09/03/2023. -Hold hydrochlorothiazide  - IV fluid as above - Follow-up with PCP as outpatient workup  BPH with urinary obstruction -Flomax   Liver cyst: CT scan  showed a hepatic cyst measuring up to 4.1  cm, increased in size from prior CT in 2021.  -need to follow-up with PCP as outpatient work up  Depression with anxiety -Celexa       DVT ppx: SQ Heparin     Code Status: Full code     Family Communication:    yes, patient's  sister  at bed side.    Disposition Plan:  Anticipate discharge back to previous environment  Consults called:  none  Admission status and Level of care: Med-Surg:  as inpt        Dispo: The patient is from: Home              Anticipated d/c is to: Home              Anticipated d/c date is: 2 days              Patient currently is not medically stable to d/c.    Severity of Illness:  The appropriate patient status for this patient is INPATIENT. Inpatient status is judged to be reasonable and necessary in order to provide the required intensity of service to ensure the patient's safety. The patient's presenting symptoms, physical exam findings, and initial radiographic and laboratory data in the context of their chronic comorbidities is felt to place them at high risk for further clinical deterioration. Furthermore, it is not anticipated that the patient will be medically stable for discharge from the hospital within 2 midnights of admission.   * I certify that at the point of admission it is my clinical judgment that the patient will require inpatient hospital care spanning beyond 2 midnights from the point of admission due to high intensity of service, high risk for further deterioration and high frequency of surveillance required.*       Date of Service 03/05/2024    Caleb Exon [facility]   If 7PM-7AM, please contact night-coverage [email or web address] 03/05/2024, 10:53  PM     [1] No Known Allergies

## 2024-03-05 NOTE — ED Provider Notes (Signed)
 [facility] Provider Note    Event Date/Time   First MD Initiated Contact with Patient 03/05/24 1949     (approximate)   History   Chief Complaint Nausea and Abdominal Pain   HPI  Brandon Benjamin is a 78 y.o. male with past medical history of hypertension, hyperlipidemia, diabetes, CKD, GERD, and anemia who presents to the ED complaining of nausea and abdominal pain.  Patient reports that for the past month he has had significant abdominal discomfort and nausea whenever he goes to eat something.  He states it will happen even with water and so his fluid intake has been decreased.  He has not vomited and denies any changes in his bowel movements, states pain primarily affects the right side of his abdomen.  He has had a prior cholecystectomy, denies any urinary symptoms.     Physical Exam   Triage Vital Signs: ED Triage Vitals  Encounter Vitals Group     BP 03/05/24 1803 129/74     Girls Systolic BP Percentile --      Girls Diastolic BP Percentile --      Boys Systolic BP Percentile --      Boys Diastolic BP Percentile --      Pulse Rate 03/05/24 1802 100     Resp 03/05/24 1802 20     Temp 03/05/24 1802 98 F (36.7 C)     Temp Source 03/05/24 1802 Oral     SpO2 03/05/24 1802 100 %     Weight --      Height --      Head Circumference --      Peak Flow --      Pain Score 03/05/24 1803 2     Pain Loc --      Pain Education --      Exclude from Growth Chart --     Most recent vital signs: Vitals:   03/05/24 1803 03/05/24 2130  BP: 129/74 115/70  Pulse:  (!) 56  Resp:    Temp:    SpO2:  95%    Constitutional: Alert and oriented. Eyes: Conjunctivae are normal. Head: Atraumatic. Nose: No congestion/rhinnorhea. Mouth/Throat: Mucous membranes are moist.  Cardiovascular: Normal rate, regular rhythm. Grossly normal heart sounds.  2+ radial pulses bilaterally. Respiratory: Normal respiratory effort.  No retractions. Lungs  CTAB. Gastrointestinal: Soft and tender to palpation in the right lower quadrant with no rebound or guarding. No distention. Musculoskeletal: No lower extremity tenderness nor edema.  Neurologic:  Normal speech and language. No gross focal neurologic deficits are appreciated.    ED Results / Procedures / Treatments   Labs (all labs ordered are listed, but only abnormal results are displayed) Labs Reviewed  LIPASE, BLOOD - Abnormal; Notable for the following components:      Result Value   Lipase 125 (*)    All other components within normal limits  COMPREHENSIVE METABOLIC PANEL WITH GFR - Abnormal; Notable for the following components:   CO2 17 (*)    Glucose, Bld 134 (*)    BUN 39 (*)    Creatinine, Ser 2.53 (*)    Calcium 10.6 (*)    GFR, Estimated 26 (*)    Anion gap 17 (*)    All other components within normal limits  CBC  URINALYSIS, ROUTINE W REFLEX MICROSCOPIC    RADIOLOGY CT abdomen/pelvis reviewed and interpreted by me with no inflammatory changes, focal fluid collections, or dilated bowel loops.  PROCEDURES:  Critical  Care performed: No  Procedures   MEDICATIONS ORDERED IN ED: Medications  alum & mag hydroxide-simeth (MAALOX/MYLANTA) 200-200-20 MG/5ML suspension 30 mL (has no administration in time range)    And  lidocaine  (XYLOCAINE ) 2 % viscous mouth solution 15 mL (has no administration in time range)  morphine  (PF) 4 MG/ML injection 4 mg (4 mg Intravenous Given 03/05/24 2035)  ondansetron  (ZOFRAN ) injection 4 mg (4 mg Intravenous Given 03/05/24 2035)  lactated ringers  bolus 1,000 mL (1,000 mLs Intravenous New Bag/Given 03/05/24 2035)     IMPRESSION / MDM / ASSESSMENT AND PLAN / ED COURSE  I reviewed the triage vital signs and the nursing notes.                              78 y.o. male with past medical history of hypertension, hyperlipidemia, CKD, GERD, diabetes, and anemia presents to the ED complaining of right sided abdominal pain after eating  or drinking for the past month.  Patient's presentation is most consistent with acute presentation with potential threat to life or bodily function.  Differential diagnosis includes, but is not limited to, gastritis, GERD, pancreatitis, hepatitis, bowel obstruction, appendicitis, dehydration, electrolyte abnormality, AKI.  Patient nontoxic-appearing and in no acute distress, vital signs are unremarkable.  His abdomen is soft but he does have tenderness in the right lower quadrant, will further assess with CT imaging.  Labs show AKI and acidosis concerning for dehydration, will give IV fluid bolus and treat symptomatically with IV morphine  and Zofran .  LFTs are unremarkable, lipase mildly elevated.  No significant anemia or leukocytosis noted and urinalysis pending.  CT imaging is unremarkable, symptoms [place] be related to gastritis versus PUD.  Given decreased oral intake associated with AKI, case discussed with hospitalist for admission.      FINAL CLINICAL IMPRESSION(S) / ED DIAGNOSES   Final diagnoses:  Generalized abdominal pain  AKI (acute kidney injury)     Rx / DC Orders   ED Discharge Orders     None        Note:  This document was prepared using Dragon voice recognition software and [employer] include unintentional dictation errors.   Willo Dunnings, MD 03/05/24 2212

## 2024-03-06 DIAGNOSIS — K8500 Idiopathic acute pancreatitis without necrosis or infection: Secondary | ICD-10-CM

## 2024-03-06 LAB — GLUCOSE, CAPILLARY
Glucose-Capillary: 121 mg/dL — ABNORMAL HIGH (ref 70–99)
Glucose-Capillary: 135 mg/dL — ABNORMAL HIGH (ref 70–99)
Glucose-Capillary: 99 mg/dL (ref 70–99)
Glucose-Capillary: 198 mg/dL — ABNORMAL HIGH (ref 70–99)

## 2024-03-06 LAB — BASIC METABOLIC PANEL WITH GFR
Anion gap: 8 (ref 5–15)
BUN: 35 mg/dL — ABNORMAL HIGH (ref 8–23)
CO2: 26 mmol/L (ref 22–32)
Calcium: 10 mg/dL (ref 8.9–10.3)
Chloride: 106 mmol/L (ref 98–111)
Creatinine, Ser: 2.1 mg/dL — ABNORMAL HIGH (ref 0.61–1.24)
GFR, Estimated: 32 mL/min — ABNORMAL LOW (ref >60)
Glucose, Bld: 106 mg/dL — ABNORMAL HIGH (ref 70–99)
Potassium: 3.8 mmol/L (ref 3.5–5.1)
Sodium: 140 mmol/L (ref 135–145)

## 2024-03-06 LAB — CBC
HCT: 38.2 % — ABNORMAL LOW (ref 39.0–52.0)
Hemoglobin: 12.9 g/dL — ABNORMAL LOW (ref 13.0–17.0)
MCH: 31 pg (ref 26.0–34.0)
MCHC: 33.8 g/dL (ref 30.0–36.0)
MCV: 91.8 fL (ref 80.0–100.0)
Platelets: 168 K/uL (ref 150–400)
RBC: 4.16 MIL/uL — ABNORMAL LOW (ref 4.22–5.81)
RDW: 13.2 % (ref 11.5–15.5)
WBC: 5.2 K/uL (ref 4.0–10.5)
nRBC: 0 % (ref 0.0–0.2)

## 2024-03-06 LAB — LIPID PANEL
Cholesterol: 74 mg/dL (ref 0–200)
HDL: 26 mg/dL — ABNORMAL LOW (ref >40)
LDL Cholesterol: 30 mg/dL (ref 0–99)
Total CHOL/HDL Ratio: 2.9 ratio
Triglycerides: 94 mg/dL (ref <150)
VLDL: 19 mg/dL (ref 0–40)

## 2024-03-06 LAB — COMPREHENSIVE METABOLIC PANEL WITH GFR
ALT: 16 U/L (ref 0–44)
AST: 14 U/L — ABNORMAL LOW (ref 15–41)
Albumin: 3.6 g/dL (ref 3.5–5.0)
Alkaline Phosphatase: 59 U/L (ref 38–126)
Anion gap: 9 (ref 5–15)
BUN: 28 mg/dL — ABNORMAL HIGH (ref 8–23)
CO2: 25 mmol/L (ref 22–32)
Calcium: 9.3 mg/dL (ref 8.9–10.3)
Chloride: 103 mmol/L (ref 98–111)
Creatinine, Ser: 1.75 mg/dL — ABNORMAL HIGH (ref 0.61–1.24)
GFR, Estimated: 40 mL/min — ABNORMAL LOW (ref >60)
Glucose, Bld: 115 mg/dL — ABNORMAL HIGH (ref 70–99)
Potassium: 4.2 mmol/L (ref 3.5–5.1)
Sodium: 137 mmol/L (ref 135–145)
Total Bilirubin: 0.4 mg/dL (ref 0.0–1.2)
Total Protein: 5.6 g/dL — ABNORMAL LOW (ref 6.5–8.1)

## 2024-03-06 LAB — URINALYSIS, ROUTINE W REFLEX MICROSCOPIC
Bilirubin Urine: NEGATIVE
Glucose, UA: 150 mg/dL — AB
Hgb urine dipstick: NEGATIVE
Ketones, ur: NEGATIVE mg/dL
Leukocytes,Ua: NEGATIVE
Nitrite: NEGATIVE
Protein, ur: NEGATIVE mg/dL
Specific Gravity, Urine: 1.005 (ref 1.005–1.030)
pH: 5 (ref 5.0–8.0)

## 2024-03-06 LAB — TSH: TSH: 1.98 u[IU]/mL (ref 0.350–4.500)

## 2024-03-06 LAB — VITAMIN D 25 HYDROXY (VIT D DEFICIENCY, FRACTURES): Vit D, 25-Hydroxy: 46.16 ng/mL (ref 30–100)

## 2024-03-06 LAB — LIPASE, BLOOD: Lipase: 43 U/L (ref 11–51)

## 2024-03-06 LAB — LACTIC ACID, PLASMA: Lactic Acid, Venous: 0.7 mmol/L (ref 0.5–1.9)

## 2024-03-06 MED ORDER — PHENAZOPYRIDINE HCL 100 MG PO TABS
100.0000 mg | ORAL_TABLET | Freq: Three times a day (TID) | ORAL | Status: DC
  Administered 2024-03-06 – 2024-03-08 (×6): 100 mg via ORAL
  Filled 2024-03-06 (×7): qty 1

## 2024-03-06 NOTE — Plan of Care (Signed)
   Problem: Nutrition: Goal: Adequate nutrition will be maintained Outcome: Progressing   Problem: Pain Managment: Goal: General experience of comfort will improve and/or be controlled Outcome: Progressing   Problem: Safety: Goal: Ability to remain free from injury will improve Outcome: Progressing

## 2024-03-06 NOTE — Progress Notes (Addendum)
 Progress Note   Patient: Brandon Benjamin DOB: Nov 11, 1945 DOA: 03/05/2024     1 DOS: the patient was seen and examined on 03/06/2024   Brief hospital course: HPI: Brandon Benjamin is a 78 y.o. male with medical history significant of HTN, HLD, DM, GERD, depression with anxiety, BPH, CKD stage IIIb, kidney stone, spermatocele, s/p of umbilical hernia, s/p of paraesophageal hernia, chronic hypercalcemia, who presents with nausea, abdominal pain, diarrhea.   He has nausea, dry heaves, abdominal pain for almost 1 month.  He also has intermittent diarrhea with several episodes of watery diarrhea each day.  His abdominal pain is located ion right middle quadrant constant, aching, 5 out of 10 severity, nonradiating, aggravated by eating.  He states that every time he eats he feels nauseated and with worsening abdominal pain.  No fever or chills.  No hematemesis.  Patient does not have chest pain, cough, SOB.  No symptoms of UTI.  Patient does not drink alcohol. Pt was seen by his PCP which ordered RUQ-US  which was done today, showed no acute findings.   RUQ-US  12/12 1. No acute findings. 2. Cholecystectomy, without biliary dilatation. 3. Simple hepatic cyst up to 4 cm, increased from prior; no follow-up recommended. 4. Bilateral simple renal cysts. No follow-up is recommended.   Data reviewed independently and ED Course: pt was found to have lipase 125, WBC 9.5, bicarbonate 17, worsening renal function, calcium 10.6.  Temperature normal, blood pressure 115/70, heart rate 100 --> 56, RR 20, oxygen saturation 95% on room air.  Patient is admitted to MedSurg bed as inpatient.     CT abdomen/pelvis: 1. No acute findings in the abdomen or pelvis. 2. Chronic changes. 3. The pancreas is unremarkable. 4.  The liver demonstrates a hepatic cyst measuring up to 4.1 cm, increased in size from prior CT in 2021. Changes of pneumobilia are seen.   EKG: I have personally reviewed.  Sinus rhythm, QTc 419,  low voltage.    Assessment and Plan: 78 year old male who came into ED complaining of abdominal pain with diarrhea.  Denies any fever or chills.  He did not have vomiting.  Acute pancreatitis:  I am not sure if this is pancreatitis. Lipase 125.  Etiology is not clear.  Liver function normal.  No biliary ductal dilation. Patient denies alcohol use. Shives be related to HCTZ and lisinopril  use. Hold those 2 medications for now Triglyceride level is normal  Acute renal failure superimposed on stage 3b chronic kidney disease (HCC):  Baseline creatinine 1.67 on 09/29/2023.   His creatinine is 2.53, GFR 26, BUN 37 today.   Likely due to dehydration and continuation of HCTZ, lisinopril . Hold HCTZ and lisinopril  Continue IV fluids at 100 cc/h until morning Will check kidney function in the morning   Diarrhea -Check C. difficile and GI pathogen panel   Metabolic acidosis:  Bicarbonate 17.   Likely multifactorial etiology, including starvation, worsening renal function, metformin  use Continue IV fluid and will check labs again   HTN (hypertension) Continue home amlodipine , oral hydralazine , metoprolol  Hold HCTZ and lisinopril  as above   Hyperlipidemia Switch lovastatin to pravastatin  in hospital   Type II diabetes mellitus with renal manifestations [facility]):  Recent A1c 5.9, well-controlled.   Patient is taking metformin  Continue SSI   Hypercalcemia:  This is chronic issue.   Calcium 10.6 on admission and 10  12/13 Patient had a calcium 10.9 on 01/04/2022.   Patient had elevated TSH 96.5 on 09/03/2023. Will check TSH, intact  PTH, vitamin D level Hold hydrochlorothiazide  Continue IV fluid as above Follow-up with PCP as outpatient workup   BPH with urinary obstruction Continue with Flomax    Liver cyst:  CT scan  showed a hepatic cyst measuring up to 4.1 cm, increased in size from prior CT in 2021.  - Patient will follow-up with PCP as outpatient work up   Depression with  anxiety -Celexa         Subjective: Complains of intermittent abdominal pain  Physical Exam: Vitals:   03/06/24 0155 03/06/24 0228 03/06/24 0615 03/06/24 0851  BP: 139/74 139/74 123/72 104/66  Pulse: (!) 54 (!) 54 (!) 55 60  Resp: 16 16 17 16   Temp:  98.1 F (36.7 C) 98 F (36.7 C) 97.8 F (36.6 C)  TempSrc:  Oral  Oral  SpO2: 100%  96% 94%  Weight:  74.4 kg    Height:  5' 9 (1.753 m)     Constitutional: Alert, awake, calm, comfortable HEENT: Neck supple Respiratory: Clear to auscultation B/L, no wheezing, no rales.  Cardiovascular: Regular rate and rhythm, no murmurs / rubs / gallops. No extremity edema. 2+ pedal pulses. No carotid bruits.  Abdomen: Soft, no tenderness, Bowel sounds positive.  Musculoskeletal: no clubbing / cyanosis. Good ROM, no contractures. Normal muscle tone.  Skin: no rashes, lesions, ulcers. Neurologic: CN 2-12 grossly intact. Sensation intact, No focal deficit identified Psychiatric: Alert and oriented x 3. Normal mood.    Data Reviewed:  Reviewed calcium is high but it went down to 10  Family Communication: Wife at bedside  Disposition: Status is: Inpatient Remains inpatient appropriate because: Ongoing recovery from AKI, pancreatitis, hypercalcemia  Planned Discharge Destination: Home    Time spent: 35 minutes  Author: Nena Rebel, MD 03/06/2024 2:56 PM  For on call review [email or web address].

## 2024-03-06 NOTE — Hospital Course (Signed)
 Mr. Brandon Benjamin is a 78 y.o. male with medical history significant of HTN, HLD, DM, GERD, depression with anxiety, BPH, CKD stage IIIb, kidney stone, spermatocele, s/p of umbilical hernia, s/p of paraesophageal hernia, chronic hypercalcemia, who presents with nausea, abdominal pain, diarrhea.   He has nausea, dry heaves, abdominal pain for almost 1 month.  He also has intermittent diarrhea with several episodes of watery diarrhea each day.  His abdominal pain is located ion right middle quadrant constant, aching, 5 out of 10 severity, nonradiating, aggravated by eating.  He states that every time he eats he feels nauseated and with worsening abdominal pain.  No fever or chills.  No hematemesis.  Patient does not have chest pain, cough, SOB.  No symptoms of UTI.  Patient does not drink alcohol. Pt was seen by his PCP which ordered RUQ-US  which was done today, showed no acute findings.   RUQ-US  12/12 1. No acute findings. 2. Cholecystectomy, without biliary dilatation. 3. Simple hepatic cyst up to 4 cm, increased from prior; no follow-up recommended. 4. Bilateral simple renal cysts. No follow-up is recommended.   Data reviewed independently and ED Course: pt was found to have lipase 125, WBC 9.5, bicarbonate 17, worsening renal function, calcium 10.6.  Temperature normal, blood pressure 115/70, heart rate 100 --> 56, RR 20, oxygen saturation 95% on room air.  Patient is admitted to MedSurg bed as inpatient.   CT abdomen/pelvis:  No acute finding in abdomen or pelvis, pancreas unremarkable, hepatic cyst measuring 4.1 cm increased size from 2021 CT.   EKG: I have personally reviewed.  Sinus rhythm, QTc 419, low voltage.   12/14: Patient is doing much better but is still has a creatinine abnormal and he does not feel great.  12/15: Patient complained of intermittent left lower quadrant abdominal pain.  Discussed with urologist on-call who advised that this is not urological problem.  Patient had a  constipation and bowel regimen was given.  Still does not have a bowel movement.  He was also placed on IV Protonix , Maalox, MiraLAX , Dulcolax.  12/16: I assumed care of the patient. Patient feels his abdominal pain is better today. He reports his issue now is that he has not had a bowel movement since being in the hospital.  He is worried that he will not have bowel movement at home.  I discussed that opioids will cause constipation.  MiraLAX  daily changed to twice daily.  Dulcolax 10 mg twice daily, for total of 4 doses ordered.

## 2024-03-06 NOTE — Plan of Care (Signed)

## 2024-03-07 LAB — PTH, INTACT AND CALCIUM
Calcium, Total (PTH): 9.7 mg/dL (ref 8.6–10.2)
PTH: 31 pg/mL (ref 15–65)

## 2024-03-07 LAB — GLUCOSE, CAPILLARY
Glucose-Capillary: 118 mg/dL — ABNORMAL HIGH (ref 70–99)
Glucose-Capillary: 98 mg/dL (ref 70–99)
Glucose-Capillary: 114 mg/dL — ABNORMAL HIGH (ref 70–99)
Glucose-Capillary: 165 mg/dL — ABNORMAL HIGH (ref 70–99)

## 2024-03-07 LAB — CBC
HCT: 38.5 % — ABNORMAL LOW (ref 39.0–52.0)
Hemoglobin: 13 g/dL (ref 13.0–17.0)
MCH: 31.6 pg (ref 26.0–34.0)
MCHC: 33.8 g/dL (ref 30.0–36.0)
MCV: 93.4 fL (ref 80.0–100.0)
Platelets: 155 K/uL (ref 150–400)
RBC: 4.12 MIL/uL — ABNORMAL LOW (ref 4.22–5.81)
RDW: 13.1 % (ref 11.5–15.5)
WBC: 4.5 K/uL (ref 4.0–10.5)
nRBC: 0 % (ref 0.0–0.2)

## 2024-03-07 MED ORDER — SODIUM CHLORIDE 0.9 % IV SOLN: INTRAVENOUS | Status: DC

## 2024-03-07 NOTE — Plan of Care (Signed)
  Problem: Pain Managment: Goal: General experience of comfort will improve and/or be controlled Outcome: Progressing   Problem: Safety: Goal: Ability to remain free from injury will improve Outcome: Progressing

## 2024-03-07 NOTE — Plan of Care (Signed)
°  Problem: Education: Goal: Ability to describe self-care measures that [place] prevent or decrease complications (Diabetes Survival Skills Education) will improve Outcome: Progressing   Problem: Coping: Goal: Ability to adjust to condition or change in health will improve Outcome: Progressing   Problem: Fluid Volume: Goal: Ability to maintain a balanced intake and output will improve Outcome: Progressing   Problem: Health Behavior/Discharge Planning: Goal: Ability to identify and utilize available resources and services will improve Outcome: Progressing   Problem: Health Behavior/Discharge Planning: Goal: Ability to manage health-related needs will improve Outcome: Progressing   Problem: Health Behavior/Discharge Planning: Goal: Ability to identify and utilize available resources and services will improve Outcome: Progressing   Problem: Health Behavior/Discharge Planning: Goal: Ability to manage health-related needs will improve Outcome: Progressing

## 2024-03-07 NOTE — Progress Notes (Signed)
 Progress Note   Patient: Brandon Benjamin DOB: 02-Feb-1946 DOA: 03/05/2024     2 DOS: the patient was seen and examined on 03/07/2024   Brief hospital course: HPI: Brandon Benjamin is a 78 y.o. male with medical history significant of HTN, HLD, DM, GERD, depression with anxiety, BPH, CKD stage IIIb, kidney stone, spermatocele, s/p of umbilical hernia, s/p of paraesophageal hernia, chronic hypercalcemia, who presents with nausea, abdominal pain, diarrhea.   He has nausea, dry heaves, abdominal pain for almost 1 month.  He also has intermittent diarrhea with several episodes of watery diarrhea each day.  His abdominal pain is located ion right middle quadrant constant, aching, 5 out of 10 severity, nonradiating, aggravated by eating.  He states that every time he eats he feels nauseated and with worsening abdominal pain.  No fever or chills.  No hematemesis.  Patient does not have chest pain, cough, SOB.  No symptoms of UTI.  Patient does not drink alcohol. Pt was seen by his PCP which ordered RUQ-US  which was done today, showed no acute findings.   RUQ-US  12/12 1. No acute findings. 2. Cholecystectomy, without biliary dilatation. 3. Simple hepatic cyst up to 4 cm, increased from prior; no follow-up recommended. 4. Bilateral simple renal cysts. No follow-up is recommended.   Data reviewed independently and ED Course: pt was found to have lipase 125, WBC 9.5, bicarbonate 17, worsening renal function, calcium 10.6.  Temperature normal, blood pressure 115/70, heart rate 100 --> 56, RR 20, oxygen saturation 95% on room air.  Patient is admitted to MedSurg bed as inpatient.     CT abdomen/pelvis: 1. No acute findings in the abdomen or pelvis. 2. Chronic changes. 3. The pancreas is unremarkable. 4.  The liver demonstrates a hepatic cyst measuring up to 4.1 cm, increased in size from prior CT in 2021. Changes of pneumobilia are seen.   EKG: I have personally reviewed.  Sinus rhythm, QTc 419,  low voltage.    Assessment and Plan:  78 year old male who came into ED complaining of abdominal pain with diarrhea.  Denies any fever or chills.  He did not have vomiting.   Acute pancreatitis:   Lipase 125--->43.   Etiology is not clear.  Liver function normal.  No biliary ductal dilation. Patient denies alcohol use. Jiles be related to HCTZ and lisinopril  use. Hold those 2 medications for now Triglyceride level is normal   Acute renal failure superimposed on stage 3b chronic kidney disease (HCC):  Baseline creatinine 1.67 on 09/29/2023.   His creatinine is 2.53---->1.75  Likely due to dehydration and continuation of HCTZ, lisinopril . Hold HCTZ and lisinopril  Continue IV fluids at 100 cc/h until morning Will check kidney function in the morning   Diarrhea -Improved    Metabolic acidosis: Resolved  Likely multifactorial etiology, including starvation, worsening renal function, metformin  use Continue to monitor BMP   HTN (hypertension) Continue home amlodipine , oral hydralazine , metoprolol  Hold HCTZ and lisinopril  as above   Hyperlipidemia Switch lovastatin to pravastatin  in hospital   Type II diabetes mellitus with renal manifestations [facility]):  Recent A1c 5.9, well-controlled.   Patient is taking metformin  Continue SSI   Hypercalcemia: Resolved This is chronic issue.   Calcium 10.6 on admission and 10  12/13 Patient had a calcium 10.9 on 01/04/2022.   Patient had elevated TSH 96.5 on 09/03/2023. TSH, intact PTH, vitamin D level are within normal limit Hold hydrochlorothiazide  Continue IV fluid as above Follow-up with PCP as outpatient workup  BPH with urinary obstruction Continue with Flomax    Liver cyst:  CT scan  showed a hepatic cyst measuring up to 4.1 cm, increased in size from prior CT in 2021.  - Patient will follow-up with PCP as outpatient work up   Depression with anxiety -Celexa      Subjective: No abdominal pain but feels generally  weak  Physical Exam: Vitals:   03/06/24 1611 03/06/24 2027 03/07/24 0434 03/07/24 0756  BP: 105/66 (!) 100/53 111/67 117/72  Pulse: (!) 51 (!) 57 (!) 52 (!) 53  Resp: 14   17  Temp: 98 F (36.7 C) (!) 97.5 F (36.4 C) 98 F (36.7 C) 97.8 F (36.6 C)  TempSrc: Oral Oral Oral Oral  SpO2: 95% 95% 96% 96%  Weight:      Height:       Constitutional: Alert, awake, calm, comfortable HEENT: Neck supple Respiratory: Clear to auscultation B/L, no wheezing, no rales.  Cardiovascular: Regular rate and rhythm, no murmurs / rubs / gallops. No extremity edema. 2+ pedal pulses. No carotid bruits.  Abdomen: Soft, no tenderness, Bowel sounds positive.  Musculoskeletal: no clubbing / cyanosis. Good ROM, no contractures. Normal muscle tone.  Skin: no rashes, lesions, ulcers. Neurologic: CN 2-12 grossly intact. Sensation intact, No focal deficit identified Psychiatric: Alert and oriented x 3. Normal mood.    Data Reviewed:  Creatinine improved to 1.75  Family Communication: Family was at bedside  Disposition: Status is: Inpatient Remains inpatient appropriate because: Due to ongoing recovery from acute pancreatitis likely due to hydrochlorothiazide  and hypercalcemia, AKI now improving  Planned Discharge Destination: Home    Time spent: 35 minutes  Author: Nena Rebel, MD 03/07/2024 1:33 PM  For on call review [email or web address].

## 2024-03-08 DIAGNOSIS — K8500 Idiopathic acute pancreatitis without necrosis or infection: Secondary | ICD-10-CM | POA: Diagnosis not present

## 2024-03-08 LAB — COMPREHENSIVE METABOLIC PANEL WITH GFR
ALT: 15 U/L (ref 0–44)
AST: 15 U/L (ref 15–41)
Albumin: 4 g/dL (ref 3.5–5.0)
Alkaline Phosphatase: 64 U/L (ref 38–126)
Anion gap: 10 (ref 5–15)
BUN: 17 mg/dL (ref 8–23)
CO2: 23 mmol/L (ref 22–32)
Calcium: 10.1 mg/dL (ref 8.9–10.3)
Chloride: 106 mmol/L (ref 98–111)
Creatinine, Ser: 1.56 mg/dL — ABNORMAL HIGH (ref 0.61–1.24)
GFR, Estimated: 46 mL/min — ABNORMAL LOW (ref >60)
Glucose, Bld: 128 mg/dL — ABNORMAL HIGH (ref 70–99)
Potassium: 3.8 mmol/L (ref 3.5–5.1)
Sodium: 139 mmol/L (ref 135–145)
Total Bilirubin: 0.3 mg/dL (ref 0.0–1.2)
Total Protein: 6.2 g/dL — ABNORMAL LOW (ref 6.5–8.1)

## 2024-03-08 LAB — CBC
HCT: 42 % (ref 39.0–52.0)
Hemoglobin: 14.3 g/dL (ref 13.0–17.0)
MCH: 30.8 pg (ref 26.0–34.0)
MCHC: 34 g/dL (ref 30.0–36.0)
MCV: 90.3 fL (ref 80.0–100.0)
Platelets: 184 K/uL (ref 150–400)
RBC: 4.65 MIL/uL (ref 4.22–5.81)
RDW: 13.2 % (ref 11.5–15.5)
WBC: 8.6 K/uL (ref 4.0–10.5)
nRBC: 0 % (ref 0.0–0.2)

## 2024-03-08 LAB — GLUCOSE, CAPILLARY
Glucose-Capillary: 127 mg/dL — ABNORMAL HIGH (ref 70–99)
Glucose-Capillary: 142 mg/dL — ABNORMAL HIGH (ref 70–99)
Glucose-Capillary: 158 mg/dL — ABNORMAL HIGH (ref 70–99)
Glucose-Capillary: 154 mg/dL — ABNORMAL HIGH (ref 70–99)
Glucose-Capillary: 134 mg/dL — ABNORMAL HIGH (ref 70–99)

## 2024-03-08 LAB — MAGNESIUM: Magnesium: 1.7 mg/dL (ref 1.7–2.4)

## 2024-03-08 LAB — CALCITRIOL (1,25 DI-OH VIT D): Vit D, 1,25-Dihydroxy: 25.9 pg/mL (ref 24.8–81.5)

## 2024-03-08 LAB — LIPASE, BLOOD: Lipase: 37 U/L (ref 11–51)

## 2024-03-08 MED ORDER — ALUM & MAG HYDROXIDE-SIMETH 200-200-20 MG/5ML PO SUSP
30.0000 mL | Freq: Four times a day (QID) | ORAL | Status: DC | PRN
  Administered 2024-03-08: 08:00:00 30 mL via ORAL
  Filled 2024-03-08: qty 30

## 2024-03-08 MED ORDER — POLYETHYLENE GLYCOL 3350 17 G PO PACK
17.0000 g | PACK | Freq: Every day | ORAL | Status: DC
  Administered 2024-03-08 – 2024-03-09 (×2): 17 g via ORAL
  Filled 2024-03-08: qty 1

## 2024-03-08 MED ORDER — ENSURE PLUS HIGH PROTEIN PO LIQD
237.0000 mL | Freq: Two times a day (BID) | ORAL | Status: DC
  Administered 2024-03-08 – 2024-03-10 (×4): 237 mL via ORAL

## 2024-03-08 MED ORDER — PANTOPRAZOLE SODIUM 40 MG IV SOLR
40.0000 mg | Freq: Two times a day (BID) | INTRAVENOUS | Status: DC
  Administered 2024-03-08 – 2024-03-10 (×4): 40 mg via INTRAVENOUS
  Filled 2024-03-08 (×5): qty 10

## 2024-03-08 MED ORDER — BISACODYL 5 MG PO TBEC
10.0000 mg | DELAYED_RELEASE_TABLET | Freq: Every day | ORAL | Status: DC | PRN
  Administered 2024-03-08 – 2024-03-09 (×2): 10 mg via ORAL
  Filled 2024-03-08 (×2): qty 2

## 2024-03-08 NOTE — Plan of Care (Signed)
   Problem: Coping: Goal: Ability to adjust to condition or change in health will improve Outcome: Progressing   Problem: Fluid Volume: Goal: Ability to maintain a balanced intake and output will improve Outcome: Progressing   Problem: Health Behavior/Discharge Planning: Goal: Ability to manage health-related needs will improve Outcome: Progressing   Problem: Nutritional: Goal: Maintenance of adequate nutrition will improve Outcome: Progressing

## 2024-03-08 NOTE — Care Management Important Message (Signed)
 Important Message  Patient Details  Name: Brandon Benjamin MRN: [id number] Date of Birth: [birth year]   Important Message Given:  Yes - Medicare IM     Rashelle Ireland W, CMA 03/08/2024, 10:51 AM

## 2024-03-08 NOTE — Progress Notes (Signed)
 Progress Note   Patient: Brandon Benjamin FMW:[id number] DOB: 02-19-1946 DOA: 03/05/2024     3 DOS: the patient was seen and examined on 03/08/2024   Brief hospital course: HPI: Brandon Benjamin is a 78 y.o. male with medical history significant of HTN, HLD, DM, GERD, depression with anxiety, BPH, CKD stage IIIb, kidney stone, spermatocele, s/p of umbilical hernia, s/p of paraesophageal hernia, chronic hypercalcemia, who presents with nausea, abdominal pain, diarrhea.   He has nausea, dry heaves, abdominal pain for almost 1 month.  He also has intermittent diarrhea with several episodes of watery diarrhea each day.  His abdominal pain is located ion right middle quadrant constant, aching, 5 out of 10 severity, nonradiating, aggravated by eating.  He states that every time he eats he feels nauseated and with worsening abdominal pain.  No fever or chills.  No hematemesis.  Patient does not have chest pain, cough, SOB.  No symptoms of UTI.  Patient does not drink alcohol. Pt was seen by his PCP which ordered RUQ-US  which was done today, showed no acute findings.   RUQ-US  12/12 1. No acute findings. 2. Cholecystectomy, without biliary dilatation. 3. Simple hepatic cyst up to 4 cm, increased from prior; no follow-up recommended. 4. Bilateral simple renal cysts. No follow-up is recommended.   Data reviewed independently and ED Course: pt was found to have lipase 125, WBC 9.5, bicarbonate 17, worsening renal function, calcium 10.6.  Temperature normal, blood pressure 115/70, heart rate 100 --> 56, RR 20, oxygen saturation 95% on room air.  Patient is admitted to MedSurg bed as inpatient.     CT abdomen/pelvis:  No acute finding in abdomen or pelvis, pancreas unremarkable, hepatic cyst measuring 4.1 cm increased size from 2021 CT.   EKG: I have personally reviewed.  Sinus rhythm, QTc 419, low voltage.   12/14: Patient is doing much better but is still has a creatinine abnormal and he does not feel  great.  12/15: Patient complained of intermittent left lower quadrant abdominal pain.  Discussed with urologist on-call who advised that this is not urological problem.  Patient had a constipation and bowel regimen was given.  Still does not have a bowel movement.  He was also placed on IV Protonix , Maalox, MiraLAX , Dulcolax.  Assessment and Plan:  78 year old male who came into ED complaining of abdominal pain with diarrhea.  Denies any fever or chills.  He did not have vomiting.   Acute pancreatitis: Resolved  Lipase 125--->43.   Etiology is not clear.  Liver function normal.  No biliary ductal dilation. Patient denies alcohol use. Macaraeg be related to HCTZ and lisinopril  use. Hold those 2 medications for now Triglyceride level is normal   Acute renal failure superimposed on stage 3b chronic kidney disease (HCC):  Baseline creatinine 1.67 on 09/29/2023.   His creatinine is 2.53---->1.75  Likely due to dehydration and continuation of HCTZ, lisinopril . Hold HCTZ and lisinopril  Discontinue IV fluids at 100 cc/h 12/50 Will check kidney function in the morning   Diarrhea -Improved     Metabolic acidosis: Resolved   Likely multifactorial etiology, including starvation, worsening renal function, metformin  use Continue to monitor BMP   HTN (hypertension) Continue home amlodipine , oral hydralazine , metoprolol  Hold HCTZ and lisinopril  as above   Hyperlipidemia Switch lovastatin to pravastatin  in hospital   Type II diabetes mellitus with renal manifestations [facility]):  Recent A1c 5.9, well-controlled.   Patient is taking metformin  Continue SSI   Hypercalcemia: Resolved This is chronic issue.  Calcium 10.6 on admission and 10  12/13 Patient had a calcium 10.9 on 01/04/2022.   Patient had elevated TSH 96.5 on 09/03/2023. TSH, intact PTH, vitamin D level are within normal limit Hold hydrochlorothiazide  Continue IV fluid as above Follow-up with PCP as outpatient workup   BPH with  urinary obstruction Continue with Flomax    Liver cyst:  CT scan  showed a hepatic cyst measuring up to 4.1 cm, increased in size from prior CT in 2021.  - Patient will follow-up with PCP as outpatient work up   Depression with anxiety -Celexa   Intermittent left lower quadrant abdominal pain/constipation - 12/15: Patient's workup and treatment have been completed. - Patient still complains of intermittent left lower quadrant pain. - Will give him bowel regimen and keep him until tomorrow to make sure his abdominal pain gets better.       Subjective: Continues to have left lower quadrant abdominal pain, 1 episode of vomiting  Physical Exam: Vitals:   03/07/24 1633 03/07/24 2104 03/08/24 0513 03/08/24 0806  BP: 118/72 111/64 124/71 124/75  Pulse: (!) 53 (!) 58 (!) 56 (!) 59  Resp: 17  15 16   Temp: 97.8 F (36.6 C) 97.7 F (36.5 C) 97.9 F (36.6 C) 98 F (36.7 C)  TempSrc: Oral Oral Oral Oral  SpO2: 95% 94% 96% 96%  Weight:      Height:       Constitutional: Alert, awake, calm, comfortable HEENT: Neck supple Respiratory: Clear to auscultation B/L, no wheezing, no rales.  Cardiovascular: Regular rate and rhythm, no murmurs / rubs / gallops. No extremity edema. 2+ pedal pulses. No carotid bruits.  Abdomen: Soft, no tenderness, Bowel sounds positive.  Musculoskeletal: no clubbing / cyanosis. Good ROM, no contractures. Normal muscle tone.  Skin: no rashes, lesions, ulcers. Neurologic: CN 2-12 grossly intact. Sensation intact, No focal deficit identified Psychiatric: Alert and oriented x 3. Normal mood.    Data Reviewed:  Reviewed kidney function has improved  Family Communication: Wife at bedside  Disposition: Status is: Inpatient Remains inpatient appropriate because: On going abdominal pain  Planned Discharge Destination: Home    Time spent: 35 minutes  Author: Nena Rebel, MD 03/08/2024 3:51 PM  For on call review [email or web address]

## 2024-03-08 NOTE — Progress Notes (Signed)
 Dr. Roann asked me to review CT scan from admission. Patient presented with left abdominal pain which is persistentt x 3 days. I personally reviewed scan which shows an incidental punctuate interpolar left renal stone, no hydronephrosis. Decompressed left ureter all the way to bladder. Normal contralateral kidney/ureter. I believe GU findings are incidental and not related to pain syndrome.

## 2024-03-09 DIAGNOSIS — K5900 Constipation, unspecified: Secondary | ICD-10-CM | POA: Insufficient documentation

## 2024-03-09 LAB — BASIC METABOLIC PANEL WITH GFR
Anion gap: 8 (ref 5–15)
BUN: 16 mg/dL (ref 8–23)
CO2: 25 mmol/L (ref 22–32)
Calcium: 9.3 mg/dL (ref 8.9–10.3)
Chloride: 102 mmol/L (ref 98–111)
Creatinine, Ser: 1.47 mg/dL — ABNORMAL HIGH (ref 0.61–1.24)
GFR, Estimated: 49 mL/min — ABNORMAL LOW (ref >60)
Glucose, Bld: 120 mg/dL — ABNORMAL HIGH (ref 70–99)
Potassium: 4.1 mmol/L (ref 3.5–5.1)
Sodium: 136 mmol/L (ref 135–145)

## 2024-03-09 LAB — GLUCOSE, CAPILLARY
Glucose-Capillary: 105 mg/dL — ABNORMAL HIGH (ref 70–99)
Glucose-Capillary: 133 mg/dL — ABNORMAL HIGH (ref 70–99)
Glucose-Capillary: 181 mg/dL — ABNORMAL HIGH (ref 70–99)
Glucose-Capillary: 213 mg/dL — ABNORMAL HIGH (ref 70–99)

## 2024-03-09 MED ORDER — BISACODYL 5 MG PO TBEC
10.0000 mg | DELAYED_RELEASE_TABLET | Freq: Once | ORAL | Status: AC
  Administered 2024-03-09: 11:00:00 10 mg via ORAL
  Filled 2024-03-09: qty 2

## 2024-03-09 MED ORDER — BISACODYL 5 MG PO TBEC
10.0000 mg | DELAYED_RELEASE_TABLET | Freq: Two times a day (BID) | ORAL | Status: DC
  Administered 2024-03-09 – 2024-03-10 (×2): 10 mg via ORAL
  Filled 2024-03-09 (×2): qty 2

## 2024-03-09 MED ORDER — POLYETHYLENE GLYCOL 3350 17 G PO PACK
17.0000 g | PACK | Freq: Two times a day (BID) | ORAL | Status: DC
  Administered 2024-03-09 – 2024-03-10 (×2): 17 g via ORAL
  Filled 2024-03-09 (×2): qty 1

## 2024-03-09 NOTE — Assessment & Plan Note (Signed)
 Amlodipine  10 mg daily, hydralazine  25 mg 3 times daily, metoprolol  100 mg daily resumed Hydralazine  5 mg IV every 2 hours as needed for SBP > 165

## 2024-03-09 NOTE — Progress Notes (Signed)
 TOC Brief Assessment  Patient is from home, admitted with acute pancreatitis. Patient with a payor and a PCP. No TOC needs identified at this time.

## 2024-03-09 NOTE — Assessment & Plan Note (Signed)
 Home Celexa  10 mg daily

## 2024-03-09 NOTE — Assessment & Plan Note (Signed)
 Patient now constipated

## 2024-03-09 NOTE — Assessment & Plan Note (Signed)
 Resolved

## 2024-03-09 NOTE — Assessment & Plan Note (Signed)
 Home pravastatin  80 mg daily

## 2024-03-09 NOTE — Assessment & Plan Note (Signed)
 12/16: Noted has resolved since 12/15

## 2024-03-09 NOTE — Assessment & Plan Note (Signed)
 12/16: Resolved.  Patient renal function is now back to baseline

## 2024-03-09 NOTE — Progress Notes (Addendum)
 PROGRESS NOTE  Brandon Benjamin  FMW:[id number] DOB: Oct 30, 1945 DOA: 03/05/2024 PCP: Kandis Stefano Iles, MD   Mr. Brandon Benjamin is a 78 y.o. male with medical history significant of HTN, HLD, DM, GERD, depression with anxiety, BPH, CKD stage IIIb, kidney stone, spermatocele, s/p of umbilical hernia, s/p of paraesophageal hernia, chronic hypercalcemia, who presents with nausea, abdominal pain, diarrhea.   He has nausea, dry heaves, abdominal pain for almost 1 month.  He also has intermittent diarrhea with several episodes of watery diarrhea each day.  His abdominal pain is located ion right middle quadrant constant, aching, 5 out of 10 severity, nonradiating, aggravated by eating.  He states that every time he eats he feels nauseated and with worsening abdominal pain.  No fever or chills.  No hematemesis.  Patient does not have chest pain, cough, SOB.  No symptoms of UTI.  Patient does not drink alcohol. Pt was seen by his PCP which ordered RUQ-US  which was done today, showed no acute findings.   RUQ-US  12/12 1. No acute findings. 2. Cholecystectomy, without biliary dilatation. 3. Simple hepatic cyst up to 4 cm, increased from prior; no follow-up recommended. 4. Bilateral simple renal cysts. No follow-up is recommended.   Data reviewed independently and ED Course: pt was found to have lipase 125, WBC 9.5, bicarbonate 17, worsening renal function, calcium 10.6.  Temperature normal, blood pressure 115/70, heart rate 100 --> 56, RR 20, oxygen saturation 95% on room air.  Patient is admitted to MedSurg bed as inpatient.   CT abdomen/pelvis:  No acute finding in abdomen or pelvis, pancreas unremarkable, hepatic cyst measuring 4.1 cm increased size from 2021 CT.   EKG: I have personally reviewed.  Sinus rhythm, QTc 419, low voltage.   12/14: Patient is doing much better but is still has a creatinine abnormal and he does not feel great.  12/15: Patient complained of intermittent left lower quadrant  abdominal pain.  Discussed with urologist on-call who advised that this is not urological problem.  Patient had a constipation and bowel regimen was given.  Still does not have a bowel movement.  He was also placed on IV Protonix , Maalox, MiraLAX , Dulcolax.  12/16: I assumed care of the patient. Patient feels his abdominal pain is better today. He reports his issue now is that he has not had a bowel movement since being in the hospital.  He is worried that he will not have bowel movement at home.  I discussed that opioids will cause constipation.  MiraLAX  daily changed to twice daily.  Dulcolax 10 mg twice daily, for total of 4 doses ordered.  Assessment & Plan:   Principal Problem:   Acute pancreatitis Active Problems:   Acute renal failure superimposed on stage 3b chronic kidney disease (HCC)   Hypercalcemia   Metabolic acidosis   HTN (hypertension)   Hyperlipidemia   BPH with urinary obstruction   Type II diabetes mellitus with renal manifestations (HCC)   Depression with anxiety   Diarrhea   Constipation   Assessment and Plan:  * Acute pancreatitis Patient does not use alcohol, and denies no recent trauma No dilated biliary duct noted on CT abdomen/pelvis without contrast Query idiopathic pancreatitis Symptomatic relieve 12/16:IV morphine  discontinued and will just trial PO pills  Acute renal failure superimposed on stage 3b chronic kidney disease (HCC) 12/16: Resolved.  Patient renal function is now back to baseline  Metabolic acidosis Resolved  Hypercalcemia 12/16: Noted has resolved since 12/15  Diarrhea Patient now constipated  Depression with anxiety Home Celexa  10 mg daily  Type II diabetes mellitus with renal manifestations (HCC) Insulin  SSI with at bedtime coverage ordered  BPH with urinary obstruction Home tamsulosin  0.4 mg daily  Hyperlipidemia Home pravastatin  80 mg daily  HTN (hypertension) Amlodipine  10 mg daily, hydralazine  25 mg 3 times daily,  metoprolol  100 mg daily resumed Hydralazine  5 mg IV every 2 hours as needed for SBP > 165  Constipation Secondary to opioids use in setting of acute pancreatitis  DVT prophylaxis:  Code Status:  Family Communication:  Disposition Plan:  Level of care: Med-Surg  Consultants:  Urology to place a note on 12/15: And noted that incidental punctate interpolar left renal stone, no hydronephrosis.  Per urology, GU findings are incidental and not related to pain syndrome.  Procedures:  None  Antimicrobials: Not indicated at this time  Subjective:  At bedside, patient sitting in the room with his spouse, Heron and sister, Merlynn at bedside.  Patient was able to tell me his first and last name, age, location, current calendar year.  He did have some burning with urination on the first 2 urination however by the third urination this morning this has resolved.  Objective: Vitals:   03/08/24 1955 03/08/24 2312 03/09/24 0437 03/09/24 0748  BP: 138/75 124/70 113/71 111/76  Pulse: 66 65 64 (!) 58  Resp: 17  15 18   Temp: 97.7 F (36.5 C) 97.7 F (36.5 C) 98.6 F (37 C) 97.9 F (36.6 C)  TempSrc: Oral Oral Oral Oral  SpO2: 96% 93% 96% 93%  Weight:      Height:        Intake/Output Summary (Last 24 hours) at 03/09/2024 1341 Last data filed at 03/08/2024 1900 Gross per 24 hour  Intake 691.17 ml  Output --  Net 691.17 ml   Filed Weights   03/06/24 0228  Weight: 74.4 kg   Examination:  General exam: Appears calm and comfortable  Respiratory system: Clear to auscultation. Respiratory effort normal. Cardiovascular system: S1 & S2 heard, RRR. No JVD, murmurs, rubs, gallops or clicks. No pedal edema. Gastrointestinal system: Abdomen is nondistended, soft and nontender. No organomegaly or masses felt. Normal bowel sounds heard. Central nervous system: Alert and oriented. No focal neurological deficits. Extremities: Symmetric 5 x 5 power. Skin: No rashes, lesions or  ulcers Psychiatry: Judgement and insight appear normal. Mood & affect appropriate.   Data Reviewed: I have personally reviewed following labs and imaging studies  CBC: Recent Labs  Lab 03/05/24 1804 03/06/24 0539 03/07/24 0613 03/08/24 0628  WBC 9.5 5.2 4.5 8.6  HGB 15.7 12.9* 13.0 14.3  HCT 46.0 38.2* 38.5* 42.0  MCV 91.1 91.8 93.4 90.3  PLT 234 168 155 184   Basic Metabolic Panel: Recent Labs  Lab 03/05/24 1804 03/06/24 0539 03/06/24 1548 03/08/24 0628 03/09/24 0538  NA 139 140 137 139 136  K 4.2 3.8 4.2 3.8 4.1  CL 105 106 103 106 102  CO2 17* 26 25 23 25   GLUCOSE 134* 106* 115* 128* 120*  BUN 39* 35* 28* 17 16  CREATININE 2.53* 2.10* 1.75* 1.56* 1.47*  CALCIUM 10.6* 10.0 9.3  9.7 10.1 9.3  MG  --   --   --  1.7  --    GFR: Estimated Creatinine Clearance: 42.8 mL/min (A) (by C-G formula based on SCr of 1.47 mg/dL (H)).  Liver Function Tests: Recent Labs  Lab 03/05/24 1804 03/06/24 1548 03/08/24 0628  AST 20 14* 15  ALT 21 16  15  ALKPHOS 76 59 64  BILITOT 0.3 0.4 0.3  PROT 7.0 5.6* 6.2*  ALBUMIN 4.4 3.6 4.0   Recent Labs  Lab 03/05/24 1804 03/06/24 0539 03/08/24 1245  LIPASE 125* 43 37   CBG: Recent Labs  Lab 03/08/24 1609 03/08/24 2033 03/08/24 2251 03/09/24 0748 03/09/24 1157  GLUCAP 158* 154* 134* 105* 133*   Thyroid Function Tests: Recent Labs    03/06/24 1548  TSH 1.980   Sepsis Labs: Recent Labs  Lab 03/06/24 0539  LATICACIDVEN 0.7   Scheduled Meds:  amLODipine   10 mg Oral Daily   bisacodyl   10 mg Oral BID   citalopram   10 mg Oral Daily   feeding supplement  237 mL Oral BID BM   heparin   5,000 Units Subcutaneous Q8H   hydrALAZINE   25 mg Oral TID   insulin  aspart  0-5 Units Subcutaneous QHS   insulin  aspart  0-9 Units Subcutaneous TID WC   loratadine   10 mg Oral Daily   metoprolol  succinate  100 mg Oral Daily   pantoprazole  (PROTONIX ) IV  40 mg Intravenous Q12H   polyethylene glycol  17 g Oral BID   pravastatin   80  mg Oral q1800   tamsulosin   0.4 mg Oral Daily    LOS: 4 days   Time spent: 35 minutes  Dr. Sherre [facility] If 7PM-7AM, please contact night-coverage 03/09/2024, 1:41 PM

## 2024-03-09 NOTE — Assessment & Plan Note (Addendum)
 Insulin SSI with at bedtime coverage ordered

## 2024-03-09 NOTE — Assessment & Plan Note (Signed)
 Secondary to opioids use in setting of acute pancreatitis

## 2024-03-09 NOTE — Assessment & Plan Note (Addendum)
 Patient does not use alcohol, and denies no recent trauma No dilated biliary duct noted on CT abdomen/pelvis without contrast Query idiopathic pancreatitis Symptomatic relieve 12/16:IV morphine  discontinued and will just trial PO pills

## 2024-03-09 NOTE — Assessment & Plan Note (Addendum)
 Home tamsulosin  0.4 mg daily

## 2024-03-10 LAB — GLUCOSE, CAPILLARY: Glucose-Capillary: 124 mg/dL — ABNORMAL HIGH (ref 70–99)

## 2024-03-10 NOTE — Discharge Summary (Signed)
 Physician Discharge Summary   Brandon Benjamin  male DOB: 05-02-1945  FMW:[id number]  PCP: Kandis Stefano Iles, MD  Admit date: 03/05/2024 Discharge date: ***  Admitted From: home Disposition:  home CODE STATUS: Full code  Discharge Instructions     Discharge instructions   Complete by: As directed    Since your blood pressure has been low normal, please hold your blood pressure medication except for Toprol , and follow up with your PCP in about a week to see which ones you should resume. [facility] Course:  For full details, please see H&P, progress notes, consult notes and ancillary notes.  Briefly,  ***  Takes lipitor, not lovasatin PTA  Ab pain resolved.  Had BM's.   Unless noted above, medications under STOP list are ones pt was not taking PTA.  Discharge Diagnoses:  Principal Problem:   Acute pancreatitis Active Problems:   Acute renal failure superimposed on stage 3b chronic kidney disease (HCC)   Hypercalcemia   Metabolic acidosis   HTN (hypertension)   Hyperlipidemia   BPH with urinary obstruction   Type II diabetes mellitus with renal manifestations (HCC)   Depression with anxiety   Diarrhea   Constipation   30 Day Unplanned Readmission Risk Score    Flowsheet Row ED to Hosp-Admission (Current) from 03/05/2024 in [facility] 1C MEDICAL TELEMETRY  30 Day Unplanned Readmission Risk Score (%) 12.51 Filed at 03/10/2024 0801    This score is the patient's risk of an unplanned readmission within 30 days of being discharged (0 -100%). The score is based on dignosis, age, lab data, medications, orders, and past utilization.   Low:  0-14.9   Medium: 15-21.9   High: 22-29.9   Extreme: 30 and above         Discharge Instructions:  Allergies as of 03/10/2024   No Known Allergies      Medication List     PAUSE taking these medications    amLODipine  10 MG tablet Wait to take this until your doctor or other care  provider tells you to start again. Due to low normal blood pressure. Commonly known as: NORVASC  Take 10 mg by mouth every morning.   hydrALAZINE  25 MG tablet Wait to take this until your doctor or other care provider tells you to start again. Due to low normal blood pressure. Commonly known as: APRESOLINE  Take 25 mg by mouth 3 (three) times daily.   hydrochlorothiazide  25 MG tablet Wait to take this until your doctor or other care provider tells you to start again. Due to low normal blood pressure. Commonly known as: HYDRODIURIL  Take 25 mg by mouth daily.   lisinopril  40 MG tablet Wait to take this until your doctor or other care provider tells you to start again. Due to low normal blood pressure. Commonly known as: ZESTRIL  Take 40 mg by mouth every morning.   spironolactone 25 MG tablet Wait to take this until your doctor or other care provider tells you to start again. Due to low normal blood pressure. Commonly known as: ALDACTONE Take 25 mg by mouth daily.       STOP taking these medications    dicyclomine  10 MG capsule Commonly known as: BENTYL    lovastatin 20 MG tablet Commonly known as: MEVACOR   lovastatin 40 MG tablet Commonly known as: MEVACOR   nystatin cream Commonly known as: MYCOSTATIN       TAKE these medications  Accu-Chek Aviva Plus test strip Generic drug: glucose blood   Accu-Chek Softclix Lancets lancets   acetaminophen  650 MG CR tablet Commonly known as: TYLENOL  Take 650 mg by mouth every 8 (eight) hours as needed for pain.   atorvastatin 20 MG tablet Commonly known as: LIPITOR Take 20 mg by mouth at bedtime.   cetirizine 10 MG tablet Commonly known as: ZYRTEC Take 10 mg by mouth every morning.   citalopram  10 MG tablet Commonly known as: CELEXA  Take 10 mg by mouth every morning.   fluticasone  50 MCG/ACT nasal spray Commonly known as: FLONASE  Place 1 spray into both nostrils daily.   metFORMIN  1000 MG tablet Commonly  known as: GLUCOPHAGE  Take 1,000 mg by mouth 2 (two) times daily.   metoprolol  succinate 100 MG 24 hr tablet Commonly known as: TOPROL -XL Take 100 mg by mouth every morning. Take with or immediately following a meal.   omeprazole  40 MG capsule Commonly known as: PRILOSEC Take 1 capsule (40 mg total) by mouth in the morning and at bedtime. What changed: when to take this   tamsulosin  0.4 MG Caps capsule Commonly known as: FLOMAX  Take 0.4 mg by mouth daily after breakfast.         Follow-up Information     Bender, Stefano Iles, MD Follow up in 1 week(s).   Specialty: Family Medicine Why: Hospital follow up Contact information: [address] [place] [address] [phone]                 Allergies[1]   The results of significant diagnostics from this hospitalization (including imaging, microbiology, ancillary and laboratory) are listed below for reference.   Consultations:   Procedures/Studies: US  Abdomen Complete Result Date: 03/05/2024 EXAM: COMPLETE ABDOMINAL ULTRASOUND TECHNIQUE: Real-time ultrasonography of the abdomen was performed. COMPARISON: CT from 12/06/2019. CLINICAL HISTORY: abd pain and diarrhea after eating FINDINGS: LIVER: The liver demonstrates normal echogenicity. No intrahepatic biliary ductal dilatation. A simple cyst is noted within the right lobe of the liver measuring up to 4 cm. This has increased in size from the prior CT examination. BILIARY SYSTEM: Gallbladder has been surgically removed. Common bile duct is within normal limits measuring 6mm. KIDNEYS: Right kidney measures 10.2 cm. Multiple small cysts are noted, measuring up to 2.2 cm. These are simple in nature, and no follow-up is recommended. Left kidney measures 12.3 cm. A 4.1 cm an upper pole cyst is noted. No follow-up is recommended. Normal contour of kidneys. Normal cortical echogenicity. No hydronephrosis. PANCREAS: The pancreas is not well seen due to overlying  bowel gas. SPLEEN: No acute abnormality. Spleen is within normal limits in size. VESSELS: Visualized portion of the aorta is normal. Visualized portion of the inferior vena cava is normal. OTHER: No ascites. IMPRESSION: 1. No acute findings. 2. Cholecystectomy, without biliary dilatation. 3. Simple hepatic cyst up to 4 cm, increased from prior; no follow-up recommended. 4. Bilateral simple renal cysts. No follow-up is recommended. Electronically signed by: Oneil Devonshire MD 03/05/2024 08:42 PM EST RP Workstation: [id number]   CT ABDOMEN PELVIS WO CONTRAST Result Date: 03/05/2024 EXAM: CT ABDOMEN AND PELVIS WITHOUT CONTRAST 03/05/2024 08:26:45 PM TECHNIQUE: CT of the abdomen and pelvis was performed without the administration of intravenous contrast. Multiplanar reformatted images are provided for review. Automated exposure control, iterative reconstruction, and/or weight-based adjustment of the mA/kV was utilized to reduce the radiation dose to as low as reasonably achievable. COMPARISON: Ultrasound from the previous day. CLINICAL HISTORY: Persistent nausea for 1 month. FINDINGS: LOWER  CHEST: Bases are clear. LIVER: The liver demonstrates a hepatic cyst measuring up to 4.1 cm, increased in size from prior CT in 2021. Changes of pneumobilia are seen. GALLBLADDER AND BILE DUCTS: The gallbladder has been surgically removed. SPLEEN: The spleen is unremarkable. PANCREAS: The pancreas is unremarkable. ADRENAL GLANDS: The adrenal glands are within normal limits. KIDNEYS, URETERS AND BLADDER: A left renal cyst is seen. A tiny nonobstructing left renal stone is noted. No obstructive changes are seen. No hydronephrosis. The bladder is partially distended. GI AND BOWEL: The stomach shows a small sliding type hiatal hernia. No obstructive or inflammatory changes of the colon are seen. The appendix is within normal limits. Small bowel is within normal limits. PERITONEUM AND RETROPERITONEUM: No free fluid is seen. No free air.  VASCULATURE: The abdominal aorta shows no aneurysmal dilatation. LYMPH NODES: No adenopathy is seen. REPRODUCTIVE ORGANS: The prostate is within normal limits. BONES AND SOFT TISSUES: Osseous structures show degenerative change of the lumbar spine with scoliosis concave to the right. IMPRESSION: 1. No acute findings in the abdomen or pelvis. 2. Chronic changes as described above Electronically signed by: Oneil Devonshire MD 03/05/2024 08:40 PM EST RP Workstation: [id number]      Labs: BNP (last 3 results) No results for input(s): BNP in the last 8760 hours. Basic Metabolic Panel: Recent Labs  Lab 03/05/24 1804 03/06/24 0539 03/06/24 1548 03/08/24 0628 03/09/24 0538  NA 139 140 137 139 136  K 4.2 3.8 4.2 3.8 4.1  CL 105 106 103 106 102  CO2 17* 26 25 23 25   GLUCOSE 134* 106* 115* 128* 120*  BUN 39* 35* 28* 17 16  CREATININE 2.53* 2.10* 1.75* 1.56* 1.47*  CALCIUM 10.6* 10.0 9.3  9.7 10.1 9.3  MG  --   --   --  1.7  --    Liver Function Tests: Recent Labs  Lab 03/05/24 1804 03/06/24 1548 03/08/24 0628  AST 20 14* 15  ALT 21 16 15   ALKPHOS 76 59 64  BILITOT 0.3 0.4 0.3  PROT 7.0 5.6* 6.2*  ALBUMIN 4.4 3.6 4.0   Recent Labs  Lab 03/05/24 1804 03/06/24 0539 03/08/24 1245  LIPASE 125* 43 37   No results for input(s): AMMONIA in the last 168 hours. CBC: Recent Labs  Lab 03/05/24 1804 03/06/24 0539 03/07/24 0613 03/08/24 0628  WBC 9.5 5.2 4.5 8.6  HGB 15.7 12.9* 13.0 14.3  HCT 46.0 38.2* 38.5* 42.0  MCV 91.1 91.8 93.4 90.3  PLT 234 168 155 184   Cardiac Enzymes: No results for input(s): CKTOTAL, CKMB, CKMBINDEX, TROPONINI in the last 168 hours. BNP: Invalid input(s): POCBNP CBG: Recent Labs  Lab 03/09/24 0748 03/09/24 1157 03/09/24 1541 03/09/24 2021 03/10/24 0737  GLUCAP 105* 133* 181* 213* 124*   D-Dimer No results for input(s): DDIMER in the last 72 hours. Hgb A1c No results for input(s): HGBA1C in the last 72 hours. Lipid  Profile No results for input(s): CHOL, HDL, LDLCALC, TRIG, CHOLHDL, LDLDIRECT in the last 72 hours. Thyroid function studies No results for input(s): TSH, T4TOTAL, T3FREE, THYROIDAB in the last 72 hours.  Invalid input(s): FREET3 Anemia work up No results for input(s): VITAMINB12, FOLATE, FERRITIN, TIBC, IRON , RETICCTPCT in the last 72 hours. Urinalysis    Component Value Date/Time   COLORURINE STRAW (A) 03/05/2024 1805   APPEARANCEUR CLEAR (A) 03/05/2024 1805   APPEARANCEUR Hazy (A) 03/10/2019 1504   LABSPEC 1.005 03/05/2024 1805   PHURINE 5.0 03/05/2024 1805   GLUCOSEU 150 (A)  03/05/2024 1805   HGBUR NEGATIVE 03/05/2024 1805   BILIRUBINUR NEGATIVE 03/05/2024 1805   BILIRUBINUR Negative 03/10/2019 1504   KETONESUR NEGATIVE 03/05/2024 1805   PROTEINUR NEGATIVE 03/05/2024 1805   NITRITE NEGATIVE 03/05/2024 1805   LEUKOCYTESUR NEGATIVE 03/05/2024 1805   Sepsis Labs Recent Labs  Lab 03/05/24 1804 03/06/24 0539 03/07/24 0613 03/08/24 0628  WBC 9.5 5.2 4.5 8.6   Microbiology No results found for this or any previous visit (from the past 240 hours).   Total time spend on discharging this patient, including the last patient exam, discussing the hospital stay, instructions for ongoing care as it relates to all pertinent caregivers, as well as preparing the medical discharge records, prescriptions, and/or referrals as applicable, is *** minutes.    Ellouise Haber, MD  [facility] 03/10/2024, 9:36 AM       [1] No Known Allergies

## 2024-04-26 ENCOUNTER — Other Ambulatory Visit (HOSPITAL_COMMUNITY): Payer: Self-pay
